# Patient Record
Sex: Male | Born: 1955
Health system: Southern US, Community
[De-identification: ages and names within clinical notes are randomized; demographics above are authoritative.]

## PROBLEM LIST (undated history)

## (undated) DIAGNOSIS — I10 Essential (primary) hypertension: Secondary | ICD-10-CM

## (undated) DIAGNOSIS — E119 Type 2 diabetes mellitus without complications: Secondary | ICD-10-CM

## (undated) DIAGNOSIS — C9210 Chronic myeloid leukemia, BCR/ABL-positive, not having achieved remission: Secondary | ICD-10-CM

## (undated) DIAGNOSIS — E785 Hyperlipidemia, unspecified: Secondary | ICD-10-CM

## (undated) DIAGNOSIS — M549 Dorsalgia, unspecified: Secondary | ICD-10-CM

## (undated) DIAGNOSIS — C9590 Leukemia, unspecified not having achieved remission: Secondary | ICD-10-CM

## (undated) DIAGNOSIS — Z9289 Personal history of other medical treatment: Secondary | ICD-10-CM

## (undated) DIAGNOSIS — G8929 Other chronic pain: Secondary | ICD-10-CM

## (undated) HISTORY — DX: Personal history of other medical treatment: Z92.89

## (undated) HISTORY — DX: Chronic myeloid leukemia, BCR/ABL-positive, not having achieved remission: C92.10

## (undated) HISTORY — DX: Hyperlipidemia, unspecified: E78.5

## (undated) HISTORY — PX: TOTAL HIP ARTHROPLASTY: SHX124

---

## 1994-01-24 HISTORY — PX: KNEE SURGERY: SHX244

## 2005-02-05 ENCOUNTER — Ambulatory Visit (HOSPITAL_COMMUNITY): Admission: RE | Admit: 2005-02-05 | Discharge: 2005-02-05 | Payer: Self-pay | Admitting: General Surgery

## 2005-10-29 ENCOUNTER — Encounter: Admission: RE | Admit: 2005-10-29 | Discharge: 2005-10-30 | Payer: Self-pay | Admitting: Oncology

## 2005-10-29 ENCOUNTER — Encounter (HOSPITAL_COMMUNITY): Admission: RE | Admit: 2005-10-29 | Discharge: 2005-10-30 | Payer: Self-pay | Admitting: Oncology

## 2005-10-29 ENCOUNTER — Ambulatory Visit (HOSPITAL_COMMUNITY): Payer: Self-pay | Admitting: Oncology

## 2005-11-29 ENCOUNTER — Encounter: Admission: RE | Admit: 2005-11-29 | Discharge: 2005-11-29 | Payer: Self-pay | Admitting: Oncology

## 2005-11-29 ENCOUNTER — Encounter (HOSPITAL_COMMUNITY): Admission: RE | Admit: 2005-11-29 | Discharge: 2005-12-29 | Payer: Self-pay | Admitting: Oncology

## 2005-12-27 ENCOUNTER — Ambulatory Visit (HOSPITAL_COMMUNITY): Payer: Self-pay | Admitting: Oncology

## 2006-01-24 ENCOUNTER — Encounter: Admission: RE | Admit: 2006-01-24 | Discharge: 2006-01-24 | Payer: Self-pay | Admitting: Oncology

## 2006-01-24 ENCOUNTER — Encounter (HOSPITAL_COMMUNITY): Admission: RE | Admit: 2006-01-24 | Discharge: 2006-02-23 | Payer: Self-pay | Admitting: Oncology

## 2006-12-24 ENCOUNTER — Ambulatory Visit (HOSPITAL_COMMUNITY): Admission: RE | Admit: 2006-12-24 | Discharge: 2006-12-24 | Payer: Self-pay | Admitting: Family Medicine

## 2006-12-27 HISTORY — PX: BACK SURGERY: SHX140

## 2007-02-01 ENCOUNTER — Emergency Department (HOSPITAL_COMMUNITY): Admission: EM | Admit: 2007-02-01 | Discharge: 2007-02-01 | Payer: Self-pay | Admitting: Emergency Medicine

## 2007-03-06 ENCOUNTER — Ambulatory Visit (HOSPITAL_COMMUNITY): Payer: Self-pay | Admitting: Oncology

## 2007-03-06 ENCOUNTER — Encounter (HOSPITAL_COMMUNITY): Admission: RE | Admit: 2007-03-06 | Discharge: 2007-04-05 | Payer: Self-pay | Admitting: Oncology

## 2007-12-19 ENCOUNTER — Encounter: Admission: RE | Admit: 2007-12-19 | Discharge: 2007-12-19 | Payer: Self-pay | Admitting: Specialist

## 2008-01-22 ENCOUNTER — Ambulatory Visit (HOSPITAL_COMMUNITY): Admission: RE | Admit: 2008-01-22 | Discharge: 2008-01-23 | Payer: Self-pay | Admitting: Specialist

## 2008-06-25 ENCOUNTER — Encounter: Payer: Self-pay | Admitting: Internal Medicine

## 2008-06-25 ENCOUNTER — Ambulatory Visit (HOSPITAL_COMMUNITY): Admission: RE | Admit: 2008-06-25 | Discharge: 2008-06-25 | Payer: Self-pay | Admitting: Internal Medicine

## 2008-06-25 ENCOUNTER — Ambulatory Visit: Payer: Self-pay | Admitting: Internal Medicine

## 2008-11-26 HISTORY — PX: CARDIAC CATHETERIZATION: SHX172

## 2009-03-30 ENCOUNTER — Ambulatory Visit (HOSPITAL_COMMUNITY): Admission: RE | Admit: 2009-03-30 | Discharge: 2009-03-30 | Payer: Self-pay | Admitting: Family Medicine

## 2009-04-18 ENCOUNTER — Encounter: Admission: RE | Admit: 2009-04-18 | Discharge: 2009-04-18 | Payer: Self-pay | Admitting: Family Medicine

## 2009-04-30 ENCOUNTER — Encounter: Payer: Self-pay | Admitting: Emergency Medicine

## 2009-04-30 ENCOUNTER — Observation Stay (HOSPITAL_COMMUNITY): Admission: EM | Admit: 2009-04-30 | Discharge: 2009-05-02 | Payer: Self-pay | Admitting: Cardiovascular Disease

## 2011-03-05 LAB — HEPARIN LEVEL (UNFRACTIONATED)
Heparin Unfractionated: 0.65 IU/mL (ref 0.30–0.70)
Heparin Unfractionated: 0.87 IU/mL — ABNORMAL HIGH (ref 0.30–0.70)

## 2011-03-05 LAB — COMPREHENSIVE METABOLIC PANEL
AST: 19 U/L (ref 0–37)
Albumin: 3.3 g/dL — ABNORMAL LOW (ref 3.5–5.2)
Alkaline Phosphatase: 34 U/L — ABNORMAL LOW (ref 39–117)
BUN: 12 mg/dL (ref 6–23)
Creatinine, Ser: 0.87 mg/dL (ref 0.4–1.5)
GFR calc Af Amer: >60 mL/min (ref >60)
Potassium: 3.6 mEq/L (ref 3.5–5.1)
Total Protein: 5.5 g/dL — ABNORMAL LOW (ref 6.0–8.3)

## 2011-03-05 LAB — DIFFERENTIAL
Eosinophils Absolute: 0 10*3/uL (ref 0.0–0.7)
Eosinophils Relative: 0 % (ref 0–5)
Lymphocytes Relative: 19 % (ref 12–46)
Lymphs Abs: 1.9 10*3/uL (ref 0.7–4.0)
Monocytes Absolute: 0.7 10*3/uL (ref 0.1–1.0)
Monocytes Relative: 7 % (ref 3–12)

## 2011-03-05 LAB — MAGNESIUM: Magnesium: 1.6 mg/dL (ref 1.5–2.5)

## 2011-03-05 LAB — CBC
HCT: 43.8 % (ref 39.0–52.0)
HCT: 36.8 % — ABNORMAL LOW (ref 39.0–52.0)
Hemoglobin: 14.6 g/dL (ref 13.0–17.0)
MCV: 92 fL (ref 78.0–100.0)
Platelets: 216 10*3/uL (ref 150–400)
Platelets: 236 10*3/uL (ref 150–400)
RBC: 4.76 MIL/uL (ref 4.22–5.81)
RDW: 14 % (ref 11.5–15.5)
RDW: 14.3 % (ref 11.5–15.5)
WBC: 9.9 10*3/uL (ref 4.0–10.5)

## 2011-03-05 LAB — TROPONIN I: Troponin I: 0.02 ng/mL (ref 0.00–0.06)

## 2011-03-05 LAB — GLUCOSE, CAPILLARY
Glucose-Capillary: 135 mg/dL — ABNORMAL HIGH (ref 70–99)
Glucose-Capillary: 145 mg/dL — ABNORMAL HIGH (ref 70–99)
Glucose-Capillary: 202 mg/dL — ABNORMAL HIGH (ref 70–99)

## 2011-03-05 LAB — TSH: TSH: 1.319 u[IU]/mL (ref 0.350–4.500)

## 2011-03-05 LAB — BASIC METABOLIC PANEL
Chloride: 103 mEq/L (ref 96–112)
GFR calc non Af Amer: >60 mL/min (ref >60)
Potassium: 4 mEq/L (ref 3.5–5.1)
Sodium: 137 mEq/L (ref 135–145)

## 2011-03-05 LAB — CARDIAC PANEL(CRET KIN+CKTOT+MB+TROPI)
Relative Index: 1.8 (ref 0.0–2.5)
Total CK: 116 U/L (ref 7–232)
Troponin I: 0.02 ng/mL (ref 0.00–0.06)

## 2011-03-05 LAB — T4, FREE: Free T4: 1.28 ng/dL (ref 0.80–1.80)

## 2011-03-05 LAB — PROTIME-INR
INR: 1 (ref 0.00–1.49)
Prothrombin Time: 13.9 seconds (ref 11.6–15.2)

## 2011-03-05 LAB — POCT CARDIAC MARKERS
CKMB, poc: 1.4 ng/mL (ref 1.0–8.0)
Myoglobin, poc: 50.7 ng/mL (ref 12–200)
Troponin i, poc: <0.05 ng/mL (ref 0.00–0.09)

## 2011-03-05 LAB — T4: T4, Total: 8.9 ug/dL (ref 5.0–12.5)

## 2011-03-05 LAB — APTT: aPTT: 28 seconds (ref 24–37)

## 2011-03-05 LAB — D-DIMER, QUANTITATIVE: D-Dimer, Quant: 0.25 ug/mL-FEU (ref 0.00–0.48)

## 2011-03-05 LAB — HEMOGLOBIN A1C: Hgb A1c MFr Bld: 8 % — ABNORMAL HIGH (ref 4.6–6.1)

## 2011-03-05 LAB — CK TOTAL AND CKMB (NOT AT ARMC): Total CK: 108 U/L (ref 7–232)

## 2011-04-10 NOTE — Discharge Summary (Signed)
NAME:  Dylan Spears, Dylan Spears NO.:  [id number]   MEDICAL RECORD NO.:  [id number]          PATIENT TYPE:  OBV   LOCATION:  2609                         FACILITY:  [facility]   PHYSICIAN:  Nicki Guadalajara, M.D.     DATE OF BIRTH:  27-Jan-1956   DATE OF ADMISSION:  04/30/2009  DATE OF DISCHARGE:  05/02/2009                               DISCHARGE SUMMARY   DISCHARGE DIAGNOSES:  1. Chest pain, negative myocardial infarction, negative D-dimer, and      nonobstructive coronary artery disease.  2. Non-insulin dependent diabetes mellitus, poorly controlled.  3. Hypertension, improved.  4. Dyslipidemia.  5. Chronic back pain.  6. Chronic myelogenous leukemia.   DISCHARGE CONDITION:  Improved.   PROCEDURES:  Combined left heart cath May 02, 2009 by Dr. Nicki Guadalajara  with 10% LAD stenosis with mild-to-moderate bridging as well as left  ventricular hypertrophy on LV gram of his left ventricle and EF was  normal.   DISCHARGE MEDICATIONS:  1. Glucovance 2.5/500 twice a day.  Do not take until May 05, 2009,      in the morning as previously instructed.  In the meantime, his      glyburide 2.5 mg twice a day until May 05, 2009.  2. Metoprolol.  We changed to metoprolol tartrate 50 mg 1 pill 3 times      a day.  3. Lyrica 50 mg daily.  4. Gleevec 400 mg daily.  5. Lipitor 20 mg daily.  6. Altace 10 mg daily.  7. Aspirin 325 mg daily.  8. Stop amlodipine.   DISCHARGE INSTRUCTIONS:  1. May return to work May 06, 2009.  2. Low-sodium, heart-healthy diabetic diet.  3. Increase activity slowly.  4. May shower on May 03, 2009, no lifting for 3 days.  No driving for      3 days.  5. Wash cath site with soap and water.  Call if any bleeding,      swelling, or drainage.  6. Follow up with Dr. Tresa Endo in [facility].  We will call with      date and time.  7. Follow up with Dr. Nobie Putnam for diabetes management.  8. You are scheduled for 2-D echo, May 18, 2009, at 7 a.m. in our   [place] office.   HISTORY OF PRESENT ILLNESS:  A 55 year old African American male was  seen in the emergency room at [facility] on April 30, 2009,  after presented with chest pain.  He felt lightheaded, presyncopal, and  had tightness in his left chest and numbness, tingling in his left hand.  He was given nitroglycerin with relief of discomfort.  Over the last few  weeks, he has had increasing fatigue and shortness of breath, dyspnea on  exertion.   PAST MEDICAL HISTORY:  Diabetes x11 years, non-insulin dependent,  hypertension, hyperlipidemia, chronic back pain with history of back  surgery, CML x4  years, currently in remission.  He has had appendectomy  and left knee arthroscopy.   ALLERGIES:  No known allergies.   FAMILY HISTORY:  See H and P.   SOCIAL  HISTORY:  See H and P.   The patient is currently not working since 2008, secondary to back  injury.   PHYSICAL EXAMINATION:  VITAL SIGNS:  At discharge, blood pressure  122/82, pulse 70.  CHEST:  No further chest pain.  HEART:  Regular rate and rhythm.  LUNGS:  Clear.   HOSPITAL COURSE:  The patient was admitted by Dr. Tresa Endo to step-down  unit secondary to chest pain and to rule out MI.  He was placed on IV  nitro, IV heparin.   Cardiac enzymes were negative.  Underwent cardiac catheterization May 02, 2009, with results as stated.   After cardiac cath, once he ambulates and has no complications, he will  be discharged home.   LABORATORY DATA:  Hemoglobin 14.6 and hematocrit 43.8, WBC 9.9,  platelets 276, neutrophil 73.   Chemistry:  Sodium 139, potassium 3.6, chloride 109, CO2 of 24, BUN 12,  creatinine 0.87, glucose 129.   Coags:  Protime 13.9, INR of 1, heparin 0.48, AST 19, ALT 19, alkaline  phos 34, total bili 0.5, albumin 3.3.  Cardiac markers:  CK range 108-  129, MB 1.7-2.2, and troponin I 0.02; all negative for MI.   Calcium 8.7, magnesium 1.6.  Glycohemoglobin was 82.   TSH 1.319.   T4  was 8.9.   Free T4 was 1.28.   Free T3 was 2.8.  D-dimer was negative at 0.25.   Chest x-ray:  No acute abnormality.  EKGs, sinus rhythm, LVH, and no  acute changes.   HOSPITAL COURSE:  The patient was admitted, ruled out for MI, underwent  cardiac cath with nonobstructive coronary disease.  He was hypertensive  on admission with blood pressure of 143/97.  Medications have been  adjusted.  We did stop his Norvasc and increased his beta-blocker.  He  will follow up as instructed.      Darcella Gasman. Annie Paras, N.P.    ______________________________  Nicki Guadalajara, M.D.    LRI/MEDQ  D:  05/02/2009  T:  05/03/2009  Job:  [id number]   cc:   Patrica Duel, M.D.  Nicki Guadalajara, M.D.  Dr. Greggory Stallion

## 2011-04-10 NOTE — Op Note (Signed)
NAME:  Dylan Spears, Dylan Spears              ACCOUNT NO.:  [id number]   MEDICAL RECORD NO.:  [id number]          PATIENT TYPE:  AMB   LOCATION:  DAY                           FACILITY:  [facility]   PHYSICIAN:  R. Roetta Sessions, M.D. DATE OF BIRTH:  12/21/1955   DATE OF PROCEDURE:  06/25/2008  DATE OF DISCHARGE:                               OPERATIVE REPORT   PROCEDURE:  Colonoscopy and snare polypectomy, polyp rigid.   INDICATIONS FOR PROCEDURE:  A 55 year old African American male with no  lower GI tract symptoms referred by Dr. Sherwood Gambler for colorectal cancer  screening.  He has never had his lower GI tract imaged.  Family history  is significant and that his mother succumbed colorectal cancer and was  diagnosed with a disease at age 66.  Colonoscopy is now being done.  Risks, benefits, alternatives, and limitations have been reviewed.  Questions answered.  He is agreeable.  Please see the documentation in  the medical record.   PROCEDURE NOTE:  O2 saturation, blood pressure, pulse, and respirations  monitored throughout the entire procedure.   CONSCIOUS SEDATION:  Versed 4 mg IV and Demerol 100 mg IV in divided  doses.   INSTRUMENT:  Pentax video chip system.   FINDINGS:  Digital rectal exam revealed no abnormalities.  Endoscopic  findings:  The prep was good.  Colon:  Colonic mucosa was surveyed from  the rectosigmoid junction through the left transverse to right colon,  appendiceal orifice, ileocecal valve, and cecum.  These structures were  well seen and photographed for the record.  From this level, the scope  was slowly and cautiously withdrawn.  All previously mentioned mucosal  surfaces were again seen.  The patient had scattered left-sided  diverticula and a pedunculated 1-cm polyp in the mid descending colon.  This was resected with a hot snare and recovered through the scope  without difficulty.  The remainder of the colonic mucosa appeared  normal.  The scope was pulled down the  rectum with thorough examination  of the rectal mucosa including retroflexed view of the anal verge  demonstrated no abnormalities.  The patient tolerated the procedure well  and was reactive to Endoscopy.   IMPRESSION:  Normal rectum, left-sided diverticula, pedunculated mid  descending colon polyp, status post hot snare removal.  The remainder of  the colonic mucosa appeared normal.   RECOMMENDATIONS:  1. Diverticulosis and polyp literature provided to [name].  2. Follow up on Path.  3. Further recommendations to follow.       Jonathon Bellows, M.D.  Electronically Signed     RMR/MEDQ  D:  06/25/2008  T:  06/26/2008  Job:  [id number]   cc:   Madelin Rear. Sherwood Gambler, MD  Fax: [phone]

## 2011-04-10 NOTE — Op Note (Signed)
NAME:  Dylan Spears, Dylan Spears NO.:  [id number]   MEDICAL RECORD NO.:  [id number]          PATIENT TYPE:  AMB   LOCATION:  DAY                          [facility]ACILITY:  [facility]   PHYSICIAN:  Jene Every, M.D.    DATE OF BIRTH:  March 03, 1956   DATE OF PROCEDURE:  01/22/2008  DATE OF DISCHARGE:                               OPERATIVE REPORT   PREOPERATIVE DIAGNOSIS:  Spinal stenosis L4-L5.   POSTOPERATIVE DIAGNOSIS:  Spinal stenosis L4-L5.   PROCEDURE PERFORMED:  1. Central decompression L4-L5.  2. Bilateral L5 and L4 foraminotomies.  3. Use of operating microscope.   ANESTHESIA:  General.   SURGEON:  Jene Every, M.D.   ASSISTANT:  Roma Schanz, P.A.-C.   BRIEF HISTORY AND INDICATIONS:  This is a 55 year old predominantly left  lower extremity radicular pain in the L5 nerve root distribution  secondary to a small disc protrusion and lateral recess stenosis  confirmed on myelogram had persistent neural tension signs, EHL  weakness, and was indicated for decompression.  The risks and benefits  were discussed including bleeding, infection, damage to neurovascular  structures, CSF leakage, epidural fibrosis, adjacent segment disease,  need for fusion in the future, anesthetic complications, etc.   SURGICAL TECHNIQUE:  With the patient in the supine position, after the  induction of adequate general anesthesia and 2 grams Kefzol, the patient  was placed prone on the [place] frame.  All bony prominences were well  padded.  The lumbar region was prepped and draped in the usual sterile  fashion.  Two 18 gauge spinal needles were utilized to localize the L4-  L5 interspace confirmed with x-ray.  An incision was made from the  spinous process of L4 to L5, subcutaneous tissue was dissected,  electrocautery was utilized to achieve hemostasis.  The dorsal lumbar  fascia was identified and divided in line with the skin incision.  The  paraspinous muscle was elevated from the  lamina of L4 and L5.  A  McCullough retractor was placed.  The operating microscope was draped  and brought on the surgical field.  We copiously irrigated the operative  field.   A Leksell rongeur was utilized to remove a portion of the spinous  process of L4 and L5.  We then performed a hemilaminotomy of the caudad  edge of L4 with a 2 and 3 mm Kerrison detaching the ligamentum flavum.  In a similar fashion, we used a straight curette and a 2 mm Kerrison to  detach the ligamentum flavum from the cephalad edge of L5 and performed  bilateral hemilaminotomies here, as well.  Abundant epidural fat was  noted.  Protecting neural elements, we performed a foraminotomy of L5  bilaterally, was stenotic on the left predominantly, also on the right.  With the neural elements well protected, we decompressed the lateral  recesses bilaterally to the medial border of the pedicle, significant  stenosis was noted here.  There was a very small disc protrusion that I  felt was not significant to require a discectomy.  I performed  foraminotomies of L4 bilaterally as there was stenosis noted here,  as  well.  On the contralateral side, there was no disc herniation.  A  hockey stick probe was placed at the foramina of L4 and L5 and found to  be widely patent.  The thecal sac was reconstituted. There was no  evidence of CSF leakage or active bleeding.  I passed a pedicle cephalad  to above the pedicle of L4 and caudad to below the pedicle of L5 and it  was well decompressed. The point of pathology noted on the myelogram was  right at the disc space and we were above and below that.   Again, bipolar electrocautery was utilized to achieve hemostasis.  The  disc space was copiously irrigated with antibiotic irrigation throughout  the case.  Bone wax was placed on the cancellous surfaces.  Thrombin  soaked Gelfoam was placed in the laminotomy defect.  The McCullough  retractor was removed.  Irrigation was  utilized.  No evidence of active  bleeding was noted.  The dorsal lumbar fascia was reapproximated with #1  Vicryl interrupted figure-of-eight sutures, the subcutaneous tissue  reapproximated with 2-0 Vicryl simple sutures, and the skin was  reapproximated with staples, irrigating each layer throughout.  The  wound was dressed sterilely.  He was placed supine on the hospital bed,  extubated without difficulty, and transported to the recovery room in  satisfactory condition.  The patient tolerated the procedure well, there  were no complications.      Jene Every, M.D.  Electronically Signed     JB/MEDQ  D:  01/22/2008  T:  01/22/2008  Job:  [id number]

## 2011-04-10 NOTE — Cardiovascular Report (Signed)
NAME:  Dylan Spears, Dylan Spears NO.:  [id number]   MEDICAL RECORD NO.:  [id number]          PATIENT TYPE:  OBV   LOCATION:  2609                         FACILITY:  [facility]   PHYSICIAN:  Nicki Guadalajara, M.D.     DATE OF BIRTH:  [birth year]   DATE OF PROCEDURE:  05/02/2009  DATE OF DISCHARGE:  05/02/2009                            CARDIAC CATHETERIZATION   PROCEDURES:  Cardiac catheterization:  Cine coronary angiography;  biplane cineventriculography; distal aortography.   PATIENT PROFILE:  Mr. Dylan Spears is a 55 year old African American  male who has a 11-year history of diabetes mellitus, history of  hypertension, hyperlipidemia, as well as chronic back pain.  He has  recently noticed increasing fatigue and shortness of breath with  activity.  He presented to [facility] Emergency Room on April 30, 2009,  after experiencing some substernal chest tightness which responded to  nitroglycerin.  On presentation, he did have sinus tachycardia.  His  cardiac enzymes have been negative.  He was started on medical therapy.  In light of his cardiac risk factors and worrisome symptoms, definitive  cardiac catheterization was recommended.   PROCEDURE:  After premedication with Versed 2 mg plus fentanyl 15 mcg,  the patient was prepped and draped in usual fashion.  His right femoral  artery was punctured anteriorly, and a 5-French sheath was inserted  without difficulty.  Diagnostic cardiac catheterization was done  utilizing 5-French Judkins 4 left and right coronary catheters.  A 5-  French pigtail catheter was used for biplane cineventriculography as  well as distal aortography.  Hemostasis was obtained by direct manual  pressure.  The patient tolerated the procedure well.   HEMODYNAMIC DATA:  Central aortic pressure was 130/74.  Left ventricular  pressure was 130/6.  Post A-wave 11.   ANGIOGRAPHIC DATA:  Left main coronary was a large vessel which  trifurcated into a large LAD  system, a small intermediate system, and a  large left circumflex coronary artery.   The left main was angiographically normal.   The LAD was a large vessel that gave rise to 2 proximal diagonal vessels  and a proximal septal perforating artery.  After the second diagonal  vessel, there was mild 10% narrowing.  There was suggestion of very  minimal mid LAD muscle bridging which was not significant.  The LAD  otherwise was normal and wrapped around the apex.  There was no  significant obstructive disease.   The ramus intermediate vessel was a small normal vessel.   The circumflex vessel was large vessel which gave rise to 3 marginal  vessels and ended in a posterolateral coronary artery.  The circumflex  in its branches were normal.   The right coronary artery was a large vessel which was angiographically  normal and gave rise to a large PDA system.  There was a small distal  RCA beyond the since the PLA vessel essentially arose from the  circumflex system.   Biplane cineventriculography revealed normal LV contractility.  There  was evidence for left ventricular hypertrophy with increased wall  thickness appearing concentrically.   Distal aortography did  not demonstrate any evidence for renal artery  stenosis.  There was no significant aortoiliac disease.   IMPRESSION:  1. Normal left ventricular function with evidence for concentric left      ventricular hypertrophy.  2. No significant coronary obstructive disease with mild 10% narrowing      in the left anterior descending with suggestion of very mild mid      left anterior descending muscle bridging, not felt to be      significant.  3. Normal circumflex and right coronary arteries.   RECOMMENDATIONS:  Medical therapy.  With the patient's risk factors, he  will require aggressive lipid intervention and blood pressure control.           ______________________________  Nicki Guadalajara, M.D.     TK/MEDQ  D:  05/02/2009   T:  05/03/2009  Job:  [id number]   cc:   Patrica Duel, M.D.

## 2011-04-13 NOTE — H&P (Signed)
NAME:  TEREZ, FREIMARK NO.:  [id number]   MEDICAL RECORD NO.:  [id number]           PATIENT TYPE:   LOCATION:                                 FACILITY:   PHYSICIAN:  Dalia Heading, M.D.  DATE OF BIRTH:  1956/10/27   DATE OF ADMISSION:  DATE OF DISCHARGE:  LH                                HISTORY & PHYSICAL   CHIEF COMPLAINT:  Mass, back.   HISTORY OF PRESENT ILLNESS:  The patient is a 55 year old black male who is  referred for evaluation and treatment of a mass on his back.  It has been  present for some time, but has increased in size.  No drainage has been  noted.   PAST MEDICAL HISTORY:  1.  Non-insulin-dependent diabetes mellitus.  2.  Hypertension.   PAST SURGICAL HISTORY:  Unremarkable.   CURRENT MEDICATIONS:  1.  Glucovance 2.5-500 mg p.o. t.i.d.  2.  Lipitor 20 mg p.o. daily.  3.  Altace 2.5 mg p.o. daily.   ALLERGIES:  No known drug allergies.   REVIEW OF SYSTEMS:  Noncontributory.   PHYSICAL EXAMINATION:  The patient is a well-developed, well-nourished black  male in no acute distress.  He is afebrile and vital signs are stable.  HEENT:  Reveals no scleral icterus.  LUNGS:  Clear to auscultation with equal breath sounds bilaterally.  HEART:  Reveals a regular rate and rhythm without S3, S4 or murmurs.  BACK:  Reveals a 4 cm oval mass noted along the upper midportion of the  back.  A punctum is present.  No drainage is noted.   IMPRESSION:  An enlarging mass, back.   PLAN:  The patient is scheduled for excision of the mass, back, on February 05, 2005.  The risks and benefits of the procedure including bleeding, infection  and recurrence of the mass were fully explained to the patient, gave  informed consent.      MAJ/MEDQ  D:  02/01/2005  T:  02/01/2005  Job:  [id number]   cc:   Patrica Duel, M.D.  [address], [address]  [place]  [place] [address]  Fax: [phone]

## 2011-04-13 NOTE — Op Note (Signed)
NAME:  Dylan Spears, Dylan Spears NO.:  [id number]   MEDICAL RECORD NO.:  [id number]          PATIENT TYPE:  AMB   LOCATION:  DAY                           FACILITY:  [facility]   PHYSICIAN:  Dalia Heading, M.D.  DATE OF BIRTH:  March 10, 1956   DATE OF PROCEDURE:  02/05/2005  DATE OF DISCHARGE:                                 OPERATIVE REPORT   PREOPERATIVE DIAGNOSIS:  Mass, back.   POSTOPERATIVE DIAGNOSIS:  Infected sebaceous cyst, back, 4 cm.   PROCEDURE:  Excision of infected sebaceous cyst, back.   SURGEON:  Dalia Heading, M.D.   ANESTHESIA:  General.   INDICATIONS:  The patient is a 55 year old black male who was referred for  an enlarging mass on his back.  The risks and benefits of the procedure  including bleeding, infection, and recurrence of the mass, were fully  explained to the patient, who gave informed consent.   PROCEDURE NOTE:  The patient was placed on the right lateral decubitus  position after general anesthesia was administered.  The back was prepped and draped using the usual sterile technique with  Betadine.  Surgical site confirmation was performed.   A transverse incision was made over the mass.  Purulent fluid and sebum were  removed.  The wound was debrided with curettes.  Any bleeding was controlled  using Bovie electrocautery.  The wound was irrigated normal saline.  The  subcutaneous layer was reapproximated using a 3-0 Vicryl interrupted suture.  The skin was closed using a 4-0 nylon interrupted suture.  Betadine ointment  and a dry sterile dressing were applied.   All tape and needle counts were correct and the procedure.  The patient was  awakened and transferred to PACU in stable condition.   COMPLICATIONS:  None.   SPECIMEN:  Sebaceous cyst, back.   BLOOD LOSS:  Minimal.      MAJ/MEDQ  D:  02/05/2005  T:  02/05/2005  Job:  [id number]   cc:   Patrica Duel, M.D.  [address]  [place]  [place] [address]  Fax:  [phone]

## 2011-08-17 LAB — URINALYSIS, ROUTINE W REFLEX MICROSCOPIC
Ketones, ur: NEGATIVE
Nitrite: NEGATIVE
Specific Gravity, Urine: 1.022
Urobilinogen, UA: 0.2
pH: 6

## 2011-08-17 LAB — COMPREHENSIVE METABOLIC PANEL
ALT: 20
AST: 18
Albumin: 3.9
Alkaline Phosphatase: 44
GFR calc Af Amer: >60
Glucose, Bld: 89
Potassium: 3.9
Sodium: 136
Total Protein: 6.4

## 2011-08-17 LAB — DIFFERENTIAL
Basophils Relative: 1
Eosinophils Absolute: 0.1
Eosinophils Relative: 1
Monocytes Absolute: 0.5
Monocytes Relative: 7
Neutrophils Relative %: 63

## 2011-08-17 LAB — CBC
Hemoglobin: 13.9
RBC: 4.49
RDW: 14.9

## 2011-11-08 ENCOUNTER — Other Ambulatory Visit: Payer: Self-pay | Admitting: Orthopedic Surgery

## 2011-11-08 ENCOUNTER — Ambulatory Visit
Admission: RE | Admit: 2011-11-08 | Discharge: 2011-11-08 | Disposition: A | Discharge status: Home / Self Care | Payer: BC Managed Care – PPO | Source: Ambulatory Visit | Attending: Orthopedic Surgery | Admitting: Orthopedic Surgery

## 2011-11-08 DIAGNOSIS — R52 Pain, unspecified: Secondary | ICD-10-CM

## 2011-12-11 DIAGNOSIS — B351 Tinea unguium: Secondary | ICD-10-CM | POA: Diagnosis not present

## 2011-12-11 DIAGNOSIS — Z6832 Body mass index (BMI) 32.0-32.9, adult: Secondary | ICD-10-CM | POA: Diagnosis not present

## 2011-12-11 DIAGNOSIS — G8929 Other chronic pain: Secondary | ICD-10-CM | POA: Diagnosis not present

## 2011-12-19 DIAGNOSIS — C9211 Chronic myeloid leukemia, BCR/ABL-positive, in remission: Secondary | ICD-10-CM | POA: Diagnosis not present

## 2012-03-03 DIAGNOSIS — E785 Hyperlipidemia, unspecified: Secondary | ICD-10-CM | POA: Diagnosis not present

## 2012-03-03 DIAGNOSIS — E119 Type 2 diabetes mellitus without complications: Secondary | ICD-10-CM | POA: Diagnosis not present

## 2012-03-03 DIAGNOSIS — I10 Essential (primary) hypertension: Secondary | ICD-10-CM | POA: Diagnosis not present

## 2012-03-03 DIAGNOSIS — R5383 Other fatigue: Secondary | ICD-10-CM | POA: Diagnosis not present

## 2012-03-04 DIAGNOSIS — E291 Testicular hypofunction: Secondary | ICD-10-CM | POA: Diagnosis not present

## 2012-03-24 DIAGNOSIS — M2390 Unspecified internal derangement of unspecified knee: Secondary | ICD-10-CM | POA: Diagnosis not present

## 2012-04-07 DIAGNOSIS — E291 Testicular hypofunction: Secondary | ICD-10-CM | POA: Diagnosis not present

## 2012-04-15 DIAGNOSIS — E119 Type 2 diabetes mellitus without complications: Secondary | ICD-10-CM | POA: Diagnosis not present

## 2012-05-05 DIAGNOSIS — M2390 Unspecified internal derangement of unspecified knee: Secondary | ICD-10-CM | POA: Diagnosis not present

## 2012-05-08 DIAGNOSIS — E291 Testicular hypofunction: Secondary | ICD-10-CM | POA: Diagnosis not present

## 2012-05-26 DIAGNOSIS — Z9289 Personal history of other medical treatment: Secondary | ICD-10-CM

## 2012-05-26 HISTORY — DX: Personal history of other medical treatment: Z92.89

## 2012-06-03 DIAGNOSIS — Z6832 Body mass index (BMI) 32.0-32.9, adult: Secondary | ICD-10-CM | POA: Diagnosis not present

## 2012-06-03 DIAGNOSIS — M6530 Trigger finger, unspecified finger: Secondary | ICD-10-CM | POA: Diagnosis not present

## 2012-06-03 DIAGNOSIS — E785 Hyperlipidemia, unspecified: Secondary | ICD-10-CM | POA: Diagnosis not present

## 2012-06-03 DIAGNOSIS — I10 Essential (primary) hypertension: Secondary | ICD-10-CM | POA: Diagnosis not present

## 2012-06-03 DIAGNOSIS — E119 Type 2 diabetes mellitus without complications: Secondary | ICD-10-CM | POA: Diagnosis not present

## 2012-06-18 DIAGNOSIS — C9211 Chronic myeloid leukemia, BCR/ABL-positive, in remission: Secondary | ICD-10-CM | POA: Diagnosis not present

## 2012-06-19 ENCOUNTER — Ambulatory Visit (HOSPITAL_COMMUNITY)
Admission: RE | Admit: 2012-06-19 | Discharge: 2012-06-19 | Disposition: A | Discharge status: Home / Self Care | Payer: Managed Care, Other (non HMO) | Source: Ambulatory Visit | Attending: Cardiovascular Disease | Admitting: Cardiovascular Disease

## 2012-06-19 ENCOUNTER — Other Ambulatory Visit (HOSPITAL_COMMUNITY): Payer: Self-pay | Admitting: Cardiovascular Disease

## 2012-06-19 DIAGNOSIS — E782 Mixed hyperlipidemia: Secondary | ICD-10-CM | POA: Diagnosis not present

## 2012-06-19 DIAGNOSIS — R079 Chest pain, unspecified: Secondary | ICD-10-CM | POA: Diagnosis not present

## 2012-06-19 DIAGNOSIS — R0602 Shortness of breath: Secondary | ICD-10-CM | POA: Diagnosis not present

## 2012-06-19 DIAGNOSIS — E119 Type 2 diabetes mellitus without complications: Secondary | ICD-10-CM | POA: Diagnosis not present

## 2012-06-20 DIAGNOSIS — E109 Type 1 diabetes mellitus without complications: Secondary | ICD-10-CM | POA: Diagnosis not present

## 2012-06-20 DIAGNOSIS — R42 Dizziness and giddiness: Secondary | ICD-10-CM | POA: Diagnosis not present

## 2012-06-20 DIAGNOSIS — R0602 Shortness of breath: Secondary | ICD-10-CM | POA: Diagnosis not present

## 2012-06-20 DIAGNOSIS — R079 Chest pain, unspecified: Secondary | ICD-10-CM | POA: Diagnosis not present

## 2012-06-20 DIAGNOSIS — I10 Essential (primary) hypertension: Secondary | ICD-10-CM | POA: Diagnosis not present

## 2012-07-22 ENCOUNTER — Other Ambulatory Visit: Payer: Self-pay | Admitting: Cardiovascular Disease

## 2012-07-22 DIAGNOSIS — E119 Type 2 diabetes mellitus without complications: Secondary | ICD-10-CM | POA: Diagnosis not present

## 2012-07-22 DIAGNOSIS — I119 Hypertensive heart disease without heart failure: Secondary | ICD-10-CM | POA: Diagnosis not present

## 2012-07-22 DIAGNOSIS — R0789 Other chest pain: Secondary | ICD-10-CM | POA: Diagnosis not present

## 2012-07-22 DIAGNOSIS — R9430 Abnormal result of cardiovascular function study, unspecified: Secondary | ICD-10-CM | POA: Diagnosis not present

## 2012-07-24 ENCOUNTER — Encounter (HOSPITAL_COMMUNITY): Payer: Self-pay | Admitting: Pharmacy Technician

## 2012-07-27 HISTORY — PX: CARDIAC CATHETERIZATION: SHX172

## 2012-07-29 ENCOUNTER — Ambulatory Visit (HOSPITAL_COMMUNITY)
Admission: RE | Admit: 2012-07-29 | Discharge: 2012-07-29 | Disposition: A | Discharge status: Home / Self Care | Payer: Managed Care, Other (non HMO) | Source: Ambulatory Visit | Attending: Cardiovascular Disease | Admitting: Cardiovascular Disease

## 2012-07-29 ENCOUNTER — Encounter (HOSPITAL_COMMUNITY)
Admission: RE | Disposition: A | Discharge status: Home / Self Care | Payer: Self-pay | Source: Ambulatory Visit | Attending: Cardiovascular Disease

## 2012-07-29 DIAGNOSIS — R9439 Abnormal result of other cardiovascular function study: Secondary | ICD-10-CM | POA: Insufficient documentation

## 2012-07-29 DIAGNOSIS — E119 Type 2 diabetes mellitus without complications: Secondary | ICD-10-CM | POA: Insufficient documentation

## 2012-07-29 DIAGNOSIS — C9210 Chronic myeloid leukemia, BCR/ABL-positive, not having achieved remission: Secondary | ICD-10-CM | POA: Insufficient documentation

## 2012-07-29 DIAGNOSIS — I10 Essential (primary) hypertension: Secondary | ICD-10-CM | POA: Insufficient documentation

## 2012-07-29 DIAGNOSIS — R0602 Shortness of breath: Secondary | ICD-10-CM | POA: Insufficient documentation

## 2012-07-29 DIAGNOSIS — R079 Chest pain, unspecified: Secondary | ICD-10-CM | POA: Insufficient documentation

## 2012-07-29 HISTORY — PX: LEFT HEART CATHETERIZATION WITH CORONARY ANGIOGRAM: SHX5451

## 2012-07-29 LAB — GLUCOSE, CAPILLARY
Glucose-Capillary: 166 mg/dL — ABNORMAL HIGH (ref 70–99)
Glucose-Capillary: 167 mg/dL — ABNORMAL HIGH (ref 70–99)

## 2012-07-29 SURGERY — LEFT HEART CATHETERIZATION WITH CORONARY ANGIOGRAM
Anesthesia: LOCAL

## 2012-07-29 MED ORDER — HEPARIN (PORCINE) IN NACL 2-0.9 UNIT/ML-% IJ SOLN
INTRAMUSCULAR | Status: AC
  Filled 2012-07-29: qty 2000

## 2012-07-29 MED ORDER — SODIUM CHLORIDE 0.9 % IV SOLN: 250.0000 mL | INTRAVENOUS | Status: DC | PRN

## 2012-07-29 MED ORDER — FENTANYL CITRATE 0.05 MG/ML IJ SOLN
INTRAMUSCULAR | Status: AC
  Filled 2012-07-29: qty 2

## 2012-07-29 MED ORDER — MIDAZOLAM HCL 2 MG/2ML IJ SOLN
INTRAMUSCULAR | Status: AC
  Filled 2012-07-29: qty 2

## 2012-07-29 MED ORDER — ASPIRIN 81 MG PO CHEW: 324.0000 mg | CHEWABLE_TABLET | ORAL | Status: DC

## 2012-07-29 MED ORDER — SODIUM CHLORIDE 0.9 % IJ SOLN: 3.0000 mL | Freq: Two times a day (BID) | INTRAMUSCULAR | Status: DC

## 2012-07-29 MED ORDER — ONDANSETRON HCL 4 MG/2ML IJ SOLN: 4.0000 mg | Freq: Four times a day (QID) | INTRAMUSCULAR | Status: DC | PRN

## 2012-07-29 MED ORDER — SODIUM CHLORIDE 0.9 % IJ SOLN: 3.0000 mL | INTRAMUSCULAR | Status: DC | PRN

## 2012-07-29 MED ORDER — SODIUM CHLORIDE 0.9 % IV SOLN: INTRAVENOUS | Status: DC

## 2012-07-29 MED ORDER — LIDOCAINE HCL (PF) 1 % IJ SOLN
INTRAMUSCULAR | Status: AC
  Filled 2012-07-29: qty 30

## 2012-07-29 MED ORDER — NITROGLYCERIN 0.2 MG/ML ON CALL CATH LAB
INTRAVENOUS | Status: AC
  Filled 2012-07-29: qty 1

## 2012-07-29 MED ORDER — SODIUM CHLORIDE 0.9 % IV SOLN
INTRAVENOUS | Status: DC
  Administered 2012-07-29: 1000 mL via INTRAVENOUS

## 2012-07-29 NOTE — H&P (Signed)
Updated H&P:  See complete dictated office note from 07/22/2012.  No further chest pain. He has tolerated increased medical therapy.  No change in PEx. Labs reviewed. Discussed cath and possible PCI.  Plan cath this am. Airis Barbee A 07/29/2012 9:02 AM

## 2012-07-29 NOTE — CV Procedure (Signed)
Cardiac Catherization  Haynes Dage, 56 y.o., male  Full note dictated; see diagram in chart  DICTATION # [id number], [id number]  Normal coronary arteries. Normal LV function EF>60%  Tolerated well  Lennette Bihari, MD, [facility] 07/29/2012 9:49 AM

## 2012-07-30 NOTE — Cardiovascular Report (Signed)
NAME:  TAIM, WURM NO.:  [id number]  MEDICAL RECORD NO.:  [id number]  LOCATION:  [facility]                         FACILITY:  [facility]  PHYSICIAN:  Nicki Guadalajara, M.D.     DATE OF BIRTH:  [birth year]  DATE OF PROCEDURE:  07/29/2012 DATE OF DISCHARGE:  07/29/2012                           CARDIAC CATHETERIZATION   INDICATIONS:  Mr. Dylan Spears is a 56 year old African American gentleman who has a history of CML, originally diagnosed in 2004; history of hypertension; hyperlipidemia; and diabetes mellitus.  In 2010, he was noted to have mild nonobstructive CAD.  Recently, the patient had experienced recurrent episodes of chest pressure associated with shortness of breath.  A recent exercise Myoview study suggested possible ischemia in the basal inferior wall, which was present on all three tomographic projections.  With this cardiac risk factor profile, definitive cardiac catheterization was recommended.  PROCEDURE:  After premedication with Versed 2 mg plus fentanyl 50 mcg, the patient was prepped and draped in usual fashion.  He received an additional 1 mg of Versed and 25 mcg of fentanyl for additional sedation.  Catheterization was done via the right femoral artery and a 5- French sheath was inserted without difficulty.  Diagnostic catheterization was done utilizing 5-French Judkins 4 left and right coronary catheters.  A 5-French pigtail catheter was used for RAO ventriculography.  Hemostasis was obtained by direct manual pressure. The patient tolerated the procedure well.  HEMODYNAMIC DATA:  Central aortic pressure 135/75.  Left ventricular pressure 135/11.  ANGIOGRAPHIC DATA:  Left main coronary artery was angiographically normal and trifurcated into a large LAD system, a small ramus intermedius vessel and a large codominant left circumflex coronary artery.  The LAD was angiographically normal and gave rise to proximal septal perforator and diagonal  vessel.  The LAD wrapped around the LV apex and was free of significant obstructive disease.  The intermedius vessel was small caliber angiographically normal vessel.  The left circumflex coronary artery was a large caliber normal vessel that gave rise to a marginal vessel and ended in the PDA, PLA like system and was angiographically normal.  The right coronary artery was angiographically normal and gave rise to PDA and inferior LV branch and small posterolateral vessel.  RAO ventriculography revealed normal LV function.  Ejection fraction is at least 60% without focal segmental wall motion abnormalities.  There was no evidence for mitral regurgitation.  IMPRESSION: 1. Normal left ventricular function. 2. Normal coronary arteries.          ______________________________ Nicki Guadalajara, M.D.     TK/MEDQ  D:  07/29/2012  T:  07/30/2012  Job:  [id number]  cc:   Corrie Mckusick, M.D.

## 2012-08-19 DIAGNOSIS — Z23 Encounter for immunization: Secondary | ICD-10-CM | POA: Diagnosis not present

## 2012-09-02 DIAGNOSIS — R079 Chest pain, unspecified: Secondary | ICD-10-CM | POA: Diagnosis not present

## 2012-09-02 DIAGNOSIS — E119 Type 2 diabetes mellitus without complications: Secondary | ICD-10-CM | POA: Diagnosis not present

## 2012-09-03 NOTE — Addendum Note (Signed)
Addended by: Nicki Guadalajara A on: 09/03/2012 04:25 PM   Modules accepted: Orders

## 2012-09-08 DIAGNOSIS — I10 Essential (primary) hypertension: Secondary | ICD-10-CM | POA: Diagnosis not present

## 2012-09-08 DIAGNOSIS — E119 Type 2 diabetes mellitus without complications: Secondary | ICD-10-CM | POA: Diagnosis not present

## 2012-09-08 DIAGNOSIS — E785 Hyperlipidemia, unspecified: Secondary | ICD-10-CM | POA: Diagnosis not present

## 2012-09-08 DIAGNOSIS — E291 Testicular hypofunction: Secondary | ICD-10-CM | POA: Diagnosis not present

## 2012-10-17 DIAGNOSIS — E291 Testicular hypofunction: Secondary | ICD-10-CM | POA: Diagnosis not present

## 2012-11-24 DIAGNOSIS — E291 Testicular hypofunction: Secondary | ICD-10-CM | POA: Diagnosis not present

## 2012-12-01 DIAGNOSIS — M1710 Unilateral primary osteoarthritis, unspecified knee: Secondary | ICD-10-CM | POA: Diagnosis not present

## 2012-12-01 DIAGNOSIS — IMO0002 Reserved for concepts with insufficient information to code with codable children: Secondary | ICD-10-CM | POA: Diagnosis not present

## 2012-12-17 DIAGNOSIS — I10 Essential (primary) hypertension: Secondary | ICD-10-CM | POA: Diagnosis not present

## 2012-12-17 DIAGNOSIS — E119 Type 2 diabetes mellitus without complications: Secondary | ICD-10-CM | POA: Diagnosis not present

## 2012-12-17 DIAGNOSIS — C9211 Chronic myeloid leukemia, BCR/ABL-positive, in remission: Secondary | ICD-10-CM | POA: Diagnosis not present

## 2012-12-17 DIAGNOSIS — M549 Dorsalgia, unspecified: Secondary | ICD-10-CM | POA: Diagnosis not present

## 2013-01-12 DIAGNOSIS — I10 Essential (primary) hypertension: Secondary | ICD-10-CM | POA: Diagnosis not present

## 2013-01-12 DIAGNOSIS — E785 Hyperlipidemia, unspecified: Secondary | ICD-10-CM | POA: Diagnosis not present

## 2013-01-12 DIAGNOSIS — E1142 Type 2 diabetes mellitus with diabetic polyneuropathy: Secondary | ICD-10-CM | POA: Diagnosis not present

## 2013-01-12 DIAGNOSIS — E1149 Type 2 diabetes mellitus with other diabetic neurological complication: Secondary | ICD-10-CM | POA: Diagnosis not present

## 2013-01-12 DIAGNOSIS — Z6832 Body mass index (BMI) 32.0-32.9, adult: Secondary | ICD-10-CM | POA: Diagnosis not present

## 2013-04-08 DIAGNOSIS — I10 Essential (primary) hypertension: Secondary | ICD-10-CM | POA: Diagnosis not present

## 2013-04-08 DIAGNOSIS — Z23 Encounter for immunization: Secondary | ICD-10-CM | POA: Diagnosis not present

## 2013-04-08 DIAGNOSIS — G8929 Other chronic pain: Secondary | ICD-10-CM | POA: Diagnosis not present

## 2013-04-08 DIAGNOSIS — E119 Type 2 diabetes mellitus without complications: Secondary | ICD-10-CM | POA: Diagnosis not present

## 2013-04-08 DIAGNOSIS — Z6832 Body mass index (BMI) 32.0-32.9, adult: Secondary | ICD-10-CM | POA: Diagnosis not present

## 2013-05-11 DIAGNOSIS — Z23 Encounter for immunization: Secondary | ICD-10-CM | POA: Diagnosis not present

## 2013-06-08 ENCOUNTER — Encounter (HOSPITAL_COMMUNITY): Payer: Self-pay | Admitting: Emergency Medicine

## 2013-06-08 ENCOUNTER — Emergency Department (HOSPITAL_COMMUNITY)
Admission: EM | Admit: 2013-06-08 | Discharge: 2013-06-08 | Disposition: A | Discharge status: Home / Self Care | Payer: Managed Care, Other (non HMO) | Attending: Emergency Medicine | Admitting: Emergency Medicine

## 2013-06-08 DIAGNOSIS — G8929 Other chronic pain: Secondary | ICD-10-CM | POA: Insufficient documentation

## 2013-06-08 DIAGNOSIS — R221 Localized swelling, mass and lump, neck: Secondary | ICD-10-CM

## 2013-06-08 DIAGNOSIS — C9211 Chronic myeloid leukemia, BCR/ABL-positive, in remission: Secondary | ICD-10-CM | POA: Insufficient documentation

## 2013-06-08 DIAGNOSIS — E119 Type 2 diabetes mellitus without complications: Secondary | ICD-10-CM | POA: Insufficient documentation

## 2013-06-08 DIAGNOSIS — I10 Essential (primary) hypertension: Secondary | ICD-10-CM | POA: Insufficient documentation

## 2013-06-08 DIAGNOSIS — Z79899 Other long term (current) drug therapy: Secondary | ICD-10-CM | POA: Insufficient documentation

## 2013-06-08 DIAGNOSIS — M549 Dorsalgia, unspecified: Secondary | ICD-10-CM | POA: Insufficient documentation

## 2013-06-08 DIAGNOSIS — R220 Localized swelling, mass and lump, head: Secondary | ICD-10-CM | POA: Insufficient documentation

## 2013-06-08 DIAGNOSIS — Z7982 Long term (current) use of aspirin: Secondary | ICD-10-CM | POA: Insufficient documentation

## 2013-06-08 HISTORY — DX: Other chronic pain: G89.29

## 2013-06-08 HISTORY — DX: Essential (primary) hypertension: I10

## 2013-06-08 HISTORY — DX: Leukemia, unspecified not having achieved remission: C95.90

## 2013-06-08 HISTORY — DX: Type 2 diabetes mellitus without complications: E11.9

## 2013-06-08 HISTORY — DX: Dorsalgia, unspecified: M54.9

## 2013-06-08 MED ORDER — DIPHENHYDRAMINE HCL 25 MG PO TABS: 25.0000 mg | ORAL_TABLET | Freq: Four times a day (QID) | ORAL | Status: DC

## 2013-06-08 MED ORDER — DIPHENHYDRAMINE HCL 25 MG PO CAPS
25.0000 mg | ORAL_CAPSULE | Freq: Once | ORAL | Status: AC
  Administered 2013-06-08: 25 mg via ORAL
  Filled 2013-06-08: qty 1

## 2013-06-08 NOTE — ED Provider Notes (Signed)
History    This chart was scribed for non-physician practitioner Renne Crigler, PA-C, working with Gilda Crease, by Donne Anon, ED Scribe. This patient was seen in room TR05C/TR05C and the patient's care was started at 2057.  CSN: [id number] Arrival date & time 06/08/13  2040  First MD Initiated Contact with Patient 06/08/13 2057     Chief Complaint  Patient presents with  . Sore Throat    The history is provided by the patient. No language interpreter was used.   HPI Comments: Dylan Spears is a 57 y.o. male who presents to the Emergency Department complaining of sudden onset, gradually improving, constant left sided neck swelling that began at 7:00 PM this evening. He denies any new exposures or changes to his daily routine. He had a sore throat 1 week ago which has since resolved. He denies neck pain, difficulty breathing, difficulty swallowing, itching, otalgia or any other pain. He tried rubbing alcohol on the area as well as a warm compress with little relief. He is in CML remission with rechecks every 6 months.   Past Medical History  Diagnosis Date  . Hypertension   . Diabetes mellitus without complication   . Leukemia   . Chronic back pain    Past Surgical History  Procedure Laterality Date  . Back surgery    . Knee surgery     No family history on file. History  Substance Use Topics  . Smoking status: Never Smoker   . Smokeless tobacco: Not on file  . Alcohol Use: Yes    Review of Systems  Constitutional: Negative for fever.  HENT: Positive for facial swelling (neck only). Negative for ear pain, trouble swallowing, neck pain and neck stiffness.   Eyes: Negative for redness.  Respiratory: Negative for shortness of breath, wheezing and stridor.   Cardiovascular: Negative for chest pain.  Gastrointestinal: Negative for nausea and vomiting.  Musculoskeletal: Negative for myalgias.  Skin: Negative for rash.  Neurological: Negative for syncope and  light-headedness.  Psychiatric/Behavioral: Negative for confusion.    Allergies  Tylenol  Home Medications   Current Outpatient Rx  Name  Route  Sig  Dispense  Refill  . amLODipine (NORVASC) 5 MG tablet   Oral   Take 5 mg by mouth daily.         [name] aspirin 325 MG tablet   Oral   Take 325 mg by mouth daily.         [name] atorvastatin (LIPITOR) 20 MG tablet   Oral   Take 20 mg by mouth at bedtime.          . cetirizine (ZYRTEC ALLERGY) 10 MG tablet   Oral   Take 5 mg by mouth daily as needed. For allergies         . gabapentin (NEURONTIN) 300 MG capsule   Oral   Take 300 mg by mouth daily.         [name] glyBURIDE-metformin (GLUCOVANCE) 2.5-500 MG per tablet   Oral   Take 2 tablets by mouth 2 (two) times daily with a meal.         . imatinib (GLEEVEC) 400 MG tablet   Oral   Take 400 mg by mouth daily. Take with meals and large glass of water.Caution:Chemotherapy.         . Multiple Vitamin (MULTIVITAMIN WITH MINERALS) TABS   Oral   Take 1 tablet by mouth daily.         . nebivolol (BYSTOLIC)  5 MG tablet   Oral   Take 5 mg by mouth daily.         . Omega-3 Fatty Acids (FISH OIL) 1000 MG CAPS   Oral   Take 1 capsule by mouth daily.         . ramipril (ALTACE) 10 MG capsule   Oral   Take 10 mg by mouth daily.         . sitaGLIPtin (JANUVIA) 100 MG tablet   Oral   Take 100 mg by mouth daily.          BP 150/82  Pulse 84  Temp(Src) 98 F (36.7 C) (Oral)  Resp 18  SpO2 98%  Physical Exam  Nursing note and vitals reviewed. Constitutional: He appears well-developed and well-nourished. No distress.  HENT:  Head: Normocephalic and atraumatic.  Mouth/Throat: No oropharyngeal exudate.  No difficulty swallowing or breathing. Mild fullness of anterior neck to palpation. No lymphadenopathy. No thyromegaly.    Eyes: Conjunctivae are normal.  Neck: Normal range of motion. Neck supple. No tracheal deviation present. No thyromegaly present.   Cardiovascular: Normal rate.   Pulmonary/Chest: Effort normal. No respiratory distress.  Musculoskeletal: Normal range of motion.  Neurological: He is alert.  Skin: Skin is warm and dry.  Psychiatric: He has a normal mood and affect. His behavior is normal.    ED Course  Procedures (including critical care time) DIAGNOSTIC STUDIES: Oxygen Saturation is 98% on RA, normal by my interpretation.    COORDINATION OF CARE: 9:20 PM Discussed treatment plan which includes Benadryl and monitoring with pt at bedside and pt agreed to plan.   Labs Reviewed - No data to display No results found. 1. Neck swelling    Patient seen and examined. Medications ordered.   Vital signs reviewed and are as follows: Filed Vitals:   06/08/13 2046  BP: 150/82  Pulse: 84  Temp: 98 F (36.7 C)  Resp: 18   Patient remained stable and has subjective improvement during emergency department stay. I discussed this patient presentation with Dr. Blinda Leatherwood do not feel patient needs any imaging or further evaluation at this point. Patient given strict return instructions. Told to return with worsening swelling, difficulty breathing or swallowing, fever, redness, or if he has any other concerns. Patient verbalizes understanding and agrees with the plan.  MDM  Patient with neck swelling of uncertain etiology. Area is not well demarcated like a lymph node. Do not suspect thyromegaly as the area has swollen and improved over a short period of time. Do not suspect vascular etiology. Do not suspect swollen salivary gland. Swelling seems located in the soft tissues however no signs of infection or allergic reaction. Certainly do not suspect deep space infection in neck as patient has full range of motion and no fever. No difficulty swallowing or breathing. Patient was improving upon arrival to the emergency department and continued to improve. No indication for admission. Patient given strict return instructions.   I  personally performed the services described in this documentation, which was scribed in my presence. The recorded information has been reviewed and is accurate.    Renne Crigler, PA-C 06/09/13 [phone]

## 2013-06-08 NOTE — ED Notes (Signed)
PT. SWELLING AT LEFT SIDE OF THROAT  ONSET THIS EVENING , DENIES PAIN , RESPIRATIONS UNLABORED / AIRWAY INTACT . DENIES FEVER OR CHILLS.

## 2013-06-08 NOTE — ED Notes (Signed)
Pt reports swelling in neck.  Pt used rubbing alcohol and notices reduction in swelling from what he had seen previously.  No swelling noted.  Airway clear and open.

## 2013-06-09 ENCOUNTER — Ambulatory Visit (HOSPITAL_COMMUNITY)
Admission: RE | Admit: 2013-06-09 | Discharge: 2013-06-09 | Disposition: A | Discharge status: Home / Self Care | Payer: Managed Care, Other (non HMO) | Source: Ambulatory Visit | Attending: Family Medicine | Admitting: Family Medicine

## 2013-06-09 ENCOUNTER — Other Ambulatory Visit (HOSPITAL_COMMUNITY): Payer: Self-pay | Admitting: Family Medicine

## 2013-06-09 DIAGNOSIS — Z6832 Body mass index (BMI) 32.0-32.9, adult: Secondary | ICD-10-CM | POA: Diagnosis not present

## 2013-06-09 DIAGNOSIS — E041 Nontoxic single thyroid nodule: Secondary | ICD-10-CM | POA: Insufficient documentation

## 2013-06-09 DIAGNOSIS — R221 Localized swelling, mass and lump, neck: Secondary | ICD-10-CM | POA: Diagnosis not present

## 2013-06-09 DIAGNOSIS — R220 Localized swelling, mass and lump, head: Secondary | ICD-10-CM | POA: Diagnosis not present

## 2013-06-09 NOTE — ED Provider Notes (Signed)
Medical screening examination/treatment/procedure(s) were performed by non-physician practitioner and as supervising physician I was immediately available for consultation/collaboration.    Shelbee Apgar J. Mylan Schwarz, MD 06/09/13 1611 

## 2013-07-07 DIAGNOSIS — E669 Obesity, unspecified: Secondary | ICD-10-CM | POA: Diagnosis not present

## 2013-07-07 DIAGNOSIS — Z713 Dietary counseling and surveillance: Secondary | ICD-10-CM | POA: Diagnosis not present

## 2013-07-07 DIAGNOSIS — E119 Type 2 diabetes mellitus without complications: Secondary | ICD-10-CM | POA: Diagnosis not present

## 2013-07-07 DIAGNOSIS — Z6831 Body mass index (BMI) 31.0-31.9, adult: Secondary | ICD-10-CM | POA: Diagnosis not present

## 2013-08-23 ENCOUNTER — Encounter: Payer: Self-pay | Admitting: *Deleted

## 2013-08-28 ENCOUNTER — Ambulatory Visit (INDEPENDENT_AMBULATORY_CARE_PROVIDER_SITE_OTHER): Payer: Managed Care, Other (non HMO) | Admitting: Cardiovascular Disease

## 2013-08-28 ENCOUNTER — Encounter: Payer: Self-pay | Admitting: Cardiovascular Disease

## 2013-08-28 VITALS — BP 130/82 | HR 76 | Ht 73.0 in | Wt 240.0 lb

## 2013-08-28 DIAGNOSIS — IMO0002 Reserved for concepts with insufficient information to code with codable children: Secondary | ICD-10-CM | POA: Insufficient documentation

## 2013-08-28 DIAGNOSIS — I10 Essential (primary) hypertension: Secondary | ICD-10-CM

## 2013-08-28 DIAGNOSIS — I119 Hypertensive heart disease without heart failure: Secondary | ICD-10-CM

## 2013-08-28 DIAGNOSIS — E785 Hyperlipidemia, unspecified: Secondary | ICD-10-CM

## 2013-08-28 DIAGNOSIS — E1122 Type 2 diabetes mellitus with diabetic chronic kidney disease: Secondary | ICD-10-CM | POA: Insufficient documentation

## 2013-08-28 DIAGNOSIS — C9210 Chronic myeloid leukemia, BCR/ABL-positive, not having achieved remission: Secondary | ICD-10-CM

## 2013-08-28 DIAGNOSIS — E119 Type 2 diabetes mellitus without complications: Secondary | ICD-10-CM

## 2013-08-28 NOTE — Patient Instructions (Addendum)
Your physician has recommended you make the following change in your medication: reduce your asiprin down to 81 mg.  Your physician recommends that you schedule a follow-up appointment in: 1 YEAR.

## 2013-08-28 NOTE — Progress Notes (Signed)
Patient ID: Dylan Spears, male   DOB: November 06, 1956, 57 y.o.   MRN: [id number]       HPI: Dylan Spears, is a 57 y.o. male who presents to the office today for one-year cardiology evaluation. He is followed by Dr. Phillips Odor for primary medical care.  Dylan Spears has a history of CML which was originally diagnosed in 2004, as well as a history of hypertension, hyperlipidemia, and type 2 diabetes mellitus. In 2010 he developed nitrate responsive chest pain which led to cardiac catheterization. Was not found to have significant obstructive disease. In 2013 because of recurrent chest pain symptoms and echo Doppler study as well as Myoview scan were done. With exercise Myoview he exercised to a 9 minute workload. There was a question of borderline mild ischemia in the basal inferior wall. Ejection fraction is 54%. An echo Doppler study showed normal systolic and diastolic function. There was a question of pseudo-dyskinesis of the inferior wall and in [place] diaphragmatic pressure.   He had mild aortic sclerosis.  Over the past year, Dylan Spears states that he has done fairly well. He denies recurrent chest pain. His blood pressure has been fairly well controlled. He tells me Dr. Renette Butters recently check complete the laboratory this summer. He was told that things looked good. Upon obtaining these results for my review he is on a 3 drug regimen for blood pressure control consisting of amlodipine 5 mg Pravachol 10 mg and diastolic 5 mg. He's also on atorvastatin 20 mg for his hyperlipidemia. He takes diabetic medicine consisting of Glucovance as well as Januvia.   Past Medical History  Diagnosis Date  . Hypertension   . Diabetes mellitus without complication   . Leukemia   . Chronic back pain   . CML (chronic myelocytic leukemia)   . Hyperlipemia   . History of stress test     myoview study suggested the possibility of basal inferior wall ischemia which was present in all 3 tonographic  views.  Dylan Spears Hx of echocardiogram 05/2012    EF 55%Mildly hypertrophic left ventricle with normal systolic function and early signs of mild diastolic dysfuntion. Moderately dilated left atrium, mild aortic valve sclerosis with out stenosis.    Past Surgical History  Procedure Laterality Date  . Back surgery  12/2006  . Knee surgery  01/1994  . Cardiac catheterization  2010    revealed mild nonobstructive CAD  . Cardiac catheterization  07/2012    This showed essentially normal coronary arteries without obstruction. there was normal LV function.    Allergies  Allergen Reactions  . Tylenol [Acetaminophen] Other (See Comments)    "when taking gleevec, do not take tylenol"    Current Outpatient Prescriptions  Medication Sig Dispense Refill  . amLODipine (NORVASC) 5 MG tablet Take 5 mg by mouth daily.      Dylan Spears aspirin 325 MG tablet Take 325 mg by mouth daily.      Dylan Spears atorvastatin (LIPITOR) 20 MG tablet Take 20 mg by mouth at bedtime.       . cetirizine (ZYRTEC ALLERGY) 10 MG tablet Take 5 mg by mouth daily as needed. For allergies      . gabapentin (NEURONTIN) 300 MG capsule Take 300 mg by mouth daily.      Dylan Spears glyBURIDE-metformin (GLUCOVANCE) 2.5-500 MG per tablet Take 2 tablets by mouth 2 (two) times daily with a meal.      . imatinib (GLEEVEC) 400 MG tablet Take 400 mg by mouth daily. Take  with meals and large glass of water.Caution:Chemotherapy.      . Multiple Vitamin (MULTIVITAMIN WITH MINERALS) TABS Take 1 tablet by mouth daily.      . nebivolol (BYSTOLIC) 5 MG tablet Take 5 mg by mouth daily.      . Omega-3 Fatty Acids (FISH OIL) 1000 MG CAPS Take 1 capsule by mouth daily.      . ramipril (ALTACE) 10 MG capsule Take 10 mg by mouth daily.      . sitaGLIPtin (JANUVIA) 100 MG tablet Take 100 mg by mouth daily.       No current facility-administered medications for this visit.    History   Social History  . Marital Status: Married    Spouse Name: N/A    Number of Children: N/A  .  Years of Education: N/A   Occupational History  . Not on file.   Social History Main Topics  . Smoking status: Never Smoker   . Smokeless tobacco: Not on file  . Alcohol Use: Yes  . Drug Use: No  . Sexual Activity: Not on file   Other Topics Concern  . Not on file   Social History Narrative  . No narrative on file    Family History  Problem Relation Age of Onset  . Cancer Mother   . Cancer Father    Social history is notable in that he is married. He has 2 children. There is no tobacco use. He quit at age 67. He does not routinely exercise. He does note some fatigue. He rarely drinks alcohol.  ROS is negative for fevers, chills or night sweats. He denies bleeding. He denies tremors. He denies wheezing. He doesn't fatigue. He states he did have a sleep study several years ago and was told him of not having sleep apnea. He denies palpitations. Denies chest pressure. Denies change in bowel or bladder habits. There is no nausea or vomiting. He denies GU symptoms. He denies edema. He denies myalgias. He denies tremors.   Other system review is negative.  PE BP 130/82  Pulse 76  Ht 6\' 1"  (1.854 m)  Wt 240 lb (108.863 kg)  BMI 31.67 kg/m2  General: Alert, oriented, no distress.  Skin: normal turgor, no rashes HEENT: Normocephalic, atraumatic. Pupils round and reactive; sclera anicteric;no lid lag.  Nose without nasal septal hypertrophy Mouth/Parynx benign; Mallinpatti scale 2/3 Neck: No JVD, no carotid briuts Lungs: clear to ausculatation and percussion; no wheezing or rales Heart: RRR, s1 s2 normal 1/6 systolic murmur Abdomen: soft, nontender; no hepatosplenomehaly, BS+; abdominal aorta nontender and not dilated by palpation. Pulses 2+ Extremities: no clubbing cyanosis or edema, Homan's sign negative  Neurologic: grossly nonfocal  ECG: Sinus rhythm. Moderate LVH by voltage criteria. Early repolarization in V2  LABS:  BMET    Component Value Date/Time   NA 139 05/01/2009  0535   K 3.6 05/01/2009 0535   CL 109 05/01/2009 0535   CO2 24 05/01/2009 0535   GLUCOSE 129* 05/01/2009 0535   BUN 12 05/01/2009 0535   CREATININE 0.87 05/01/2009 0535   CALCIUM 8.7 05/01/2009 0535   GFRNONAA >60 05/01/2009 0535   GFRAA  Value: >60        The eGFR has been calculated using the MDRD equation. This calculation has not been validated in all clinical situations. eGFR's persistently <60 mL/min signify possible Chronic Kidney Disease. 05/01/2009 0535     Hepatic Function Panel     Component Value Date/Time   PROT 5.5* 05/01/2009  0535   ALBUMIN 3.3* 05/01/2009 0535   AST 19 05/01/2009 0535   ALT 19 05/01/2009 0535   ALKPHOS 34* 05/01/2009 0535   BILITOT 0.5 05/01/2009 0535     CBC    Component Value Date/Time   WBC 7.3 05/02/2009 0547   RBC 4.25 05/02/2009 0547   HGB 13.3 05/02/2009 0547   HCT 39.1 05/02/2009 0547   PLT 236 05/02/2009 0547   MCV 92.1 05/02/2009 0547   MCHC 33.9 05/02/2009 0547   RDW 14.3 05/02/2009 0547   LYMPHSABS 1.9 04/30/2009 1810   MONOABS 0.7 04/30/2009 1810   EOSABS 0.0 04/30/2009 1810   BASOSABS 0.1 04/30/2009 1810     BNP No results found for this basename: probnp    Lipid Panel  No results found for this basename: chol, trig, hdl, cholhdl, vldl, ldlcalc     RADIOLOGY: No results found.    ASSESSMENT AND PLAN: Mr. Henreitta Leber blood pressure today is stable on his 3 drug regimen. I will try to obtain the laboratory results from Dr. Phillips Odor. He now is on Lipitor 20 mg. He states his diabetes has been fairly well controlled. I recommended he reduce his aspirin to 81 mg from 325 mg. I have recommended continued exercise. In the past he's had low HDL levels. I will see him in one year for cardiology reevaluation or sooner if problems arise.    Lennette Bihari, MD, [facility]  08/28/2013 12:48 PM

## 2013-08-31 ENCOUNTER — Encounter: Payer: Self-pay | Admitting: Cardiovascular Disease

## 2013-09-08 DIAGNOSIS — Z23 Encounter for immunization: Secondary | ICD-10-CM | POA: Diagnosis not present

## 2013-10-08 DIAGNOSIS — Z23 Encounter for immunization: Secondary | ICD-10-CM | POA: Diagnosis not present

## 2013-11-04 ENCOUNTER — Other Ambulatory Visit: Payer: Self-pay | Admitting: *Deleted

## 2013-11-04 MED ORDER — NEBIVOLOL HCL 10 MG PO TABS: 10.0000 mg | ORAL_TABLET | Freq: Every day | ORAL | Status: DC

## 2013-11-04 MED ORDER — AMLODIPINE BESYLATE 5 MG PO TABS: 5.0000 mg | ORAL_TABLET | Freq: Every day | ORAL | Status: DC

## 2013-11-04 NOTE — Telephone Encounter (Signed)
Called and spoke with patient, Patient stated he's taking Bystolic 10 mg informed him that he was in the office in October and told Dr Tresa Endo he's taking 5mg  but now he said he's taking 10mg 

## 2013-11-30 DIAGNOSIS — Z6832 Body mass index (BMI) 32.0-32.9, adult: Secondary | ICD-10-CM | POA: Diagnosis not present

## 2013-11-30 DIAGNOSIS — E1149 Type 2 diabetes mellitus with other diabetic neurological complication: Secondary | ICD-10-CM | POA: Diagnosis not present

## 2013-11-30 DIAGNOSIS — I10 Essential (primary) hypertension: Secondary | ICD-10-CM | POA: Diagnosis not present

## 2013-11-30 DIAGNOSIS — E1142 Type 2 diabetes mellitus with diabetic polyneuropathy: Secondary | ICD-10-CM | POA: Diagnosis not present

## 2013-11-30 DIAGNOSIS — E291 Testicular hypofunction: Secondary | ICD-10-CM | POA: Diagnosis not present

## 2013-12-09 DIAGNOSIS — E119 Type 2 diabetes mellitus without complications: Secondary | ICD-10-CM | POA: Diagnosis not present

## 2013-12-09 DIAGNOSIS — M549 Dorsalgia, unspecified: Secondary | ICD-10-CM | POA: Diagnosis not present

## 2013-12-09 DIAGNOSIS — I10 Essential (primary) hypertension: Secondary | ICD-10-CM | POA: Diagnosis not present

## 2013-12-09 DIAGNOSIS — C9210 Chronic myeloid leukemia, BCR/ABL-positive, not having achieved remission: Secondary | ICD-10-CM | POA: Diagnosis not present

## 2013-12-09 DIAGNOSIS — C9211 Chronic myeloid leukemia, BCR/ABL-positive, in remission: Secondary | ICD-10-CM | POA: Diagnosis not present

## 2013-12-14 DIAGNOSIS — K1120 Sialoadenitis, unspecified: Secondary | ICD-10-CM | POA: Diagnosis not present

## 2014-03-30 DIAGNOSIS — I10 Essential (primary) hypertension: Secondary | ICD-10-CM | POA: Diagnosis not present

## 2014-03-30 DIAGNOSIS — E1149 Type 2 diabetes mellitus with other diabetic neurological complication: Secondary | ICD-10-CM | POA: Diagnosis not present

## 2014-03-30 DIAGNOSIS — R6889 Other general symptoms and signs: Secondary | ICD-10-CM | POA: Diagnosis not present

## 2014-03-30 DIAGNOSIS — E785 Hyperlipidemia, unspecified: Secondary | ICD-10-CM | POA: Diagnosis not present

## 2014-03-30 DIAGNOSIS — Z6832 Body mass index (BMI) 32.0-32.9, adult: Secondary | ICD-10-CM | POA: Diagnosis not present

## 2014-05-04 ENCOUNTER — Other Ambulatory Visit: Payer: Self-pay | Admitting: Cardiovascular Disease

## 2014-05-04 NOTE — Telephone Encounter (Signed)
Rx was sent to pharmacy electronically. 

## 2014-08-04 DIAGNOSIS — I10 Essential (primary) hypertension: Secondary | ICD-10-CM | POA: Diagnosis not present

## 2014-08-04 DIAGNOSIS — Z6831 Body mass index (BMI) 31.0-31.9, adult: Secondary | ICD-10-CM | POA: Diagnosis not present

## 2014-08-04 DIAGNOSIS — E119 Type 2 diabetes mellitus without complications: Secondary | ICD-10-CM | POA: Diagnosis not present

## 2014-08-04 DIAGNOSIS — E785 Hyperlipidemia, unspecified: Secondary | ICD-10-CM | POA: Diagnosis not present

## 2014-09-01 DIAGNOSIS — Z23 Encounter for immunization: Secondary | ICD-10-CM | POA: Diagnosis not present

## 2014-09-03 ENCOUNTER — Ambulatory Visit (INDEPENDENT_AMBULATORY_CARE_PROVIDER_SITE_OTHER): Payer: Managed Care, Other (non HMO) | Admitting: Cardiovascular Disease

## 2014-09-03 ENCOUNTER — Encounter: Payer: Self-pay | Admitting: Cardiovascular Disease

## 2014-09-03 VITALS — BP 150/90 | HR 76 | Ht 73.0 in | Wt 235.2 lb

## 2014-09-03 DIAGNOSIS — C9290 Myeloid leukemia, unspecified, not having achieved remission: Secondary | ICD-10-CM

## 2014-09-03 DIAGNOSIS — E119 Type 2 diabetes mellitus without complications: Secondary | ICD-10-CM | POA: Diagnosis not present

## 2014-09-03 DIAGNOSIS — I10 Essential (primary) hypertension: Secondary | ICD-10-CM | POA: Diagnosis not present

## 2014-09-03 DIAGNOSIS — E785 Hyperlipidemia, unspecified: Secondary | ICD-10-CM | POA: Diagnosis not present

## 2014-09-03 DIAGNOSIS — C9210 Chronic myeloid leukemia, BCR/ABL-positive, not having achieved remission: Secondary | ICD-10-CM

## 2014-09-03 NOTE — Patient Instructions (Signed)
Your physician wants you to follow-up in: 1 year or sooner if needed.You will receive a reminder letter in the mail two months in advance. If you don't receive a letter, please call our office to schedule the follow-up appointment. No changes were made today in your therapy.

## 2014-09-03 NOTE — Progress Notes (Signed)
Patient ID: Dylan Spears, male   DOB: Nov 03, 1956, 58 y.o.   MRN: [id number]        HPI: Dylan Spears, is a 58 y.o. male who presents to the office today for one-year cardiology evaluation. He is followed by Dr. Hilma Favors for primary medical care.  Dylan Spears has a history of CML which was originally diagnosed in 2004, as well as a history of hypertension, hyperlipidemia, and type 2 diabetes mellitus. In 2010 he developed nitrate responsive chest pain which led to cardiac catheterization.  Catheterization revealed normal coronary arteries and normal LV function. In 2013 because of recurrent chest pain symptoms and echo Doppler study as well as Myoview scan were done. With exercise Myoview he exercised to a 9 minute workload. There was a question of borderline mild ischemia in the basal inferior wall. Ejection fraction is 54%. An echo Doppler study showed normal systolic and diastolic function. There was a question of pseudo-dyskinesis of the inferior wall due to increased infradiaphragmatic pressure.   He had mild aortic sclerosis.  14.  Over the past year, he has lost approximately 5 pounds.  He states his blood pressure at home typically has been running in the 120s 8/68 range.  He does have issues with chronic back discomfort for which he takes Neurontin with improvement.  With reference to his hypertension.  He has been on amlodipine 5 mg Bystolic 10 mg in [place] 10 mg daily.  He has type 2 diabetes mellitus and is on Glucovance 2.5/502 tablets twice a day and Januvia 100 mg.  He denies chest pain.  He denies palpitations.  At times he notes some mild shortness of breath with significant activity.   Past Medical History  Diagnosis Date  . Hypertension   . Diabetes mellitus without complication   . Leukemia   . Chronic back pain   . CML (chronic myelocytic leukemia)   . Hyperlipemia   . History of stress test     myoview study suggested the possibility of basal inferior wall  ischemia which was present in all 3 tonographic views.  [name] Hx of echocardiogram 05/2012    EF 55%Mildly hypertrophic left ventricle with normal systolic function and early signs of mild diastolic dysfuntion. Moderately dilated left atrium, mild aortic valve sclerosis with out stenosis.    Past Surgical History  Procedure Laterality Date  . Back surgery  12/2006  . Knee surgery  01/1994  . Cardiac catheterization  2010    revealed mild nonobstructive CAD  . Cardiac catheterization  07/2012    This showed essentially normal coronary arteries without obstruction. there was normal LV function.    Allergies  Allergen Reactions  . Tylenol [Acetaminophen] Other (See Comments)    "when taking gleevec, do not take tylenol"    Current Outpatient Prescriptions  Medication Sig Dispense Refill  . amLODipine (NORVASC) 5 MG tablet TAKE 1 TABLET BY MOUTH ONCE A DAY FOR BLOOD PRESSURE.  30 tablet  4  . aspirin 325 MG tablet Take 325 mg by mouth daily.      [name] atorvastatin (LIPITOR) 20 MG tablet Take 20 mg by mouth at bedtime.       [name] BYSTOLIC 10 MG tablet TAKE ONE TABLET BY MOUTH EVERY DAY.  30 tablet  4  . cetirizine (ZYRTEC ALLERGY) 10 MG tablet Take 5 mg by mouth daily as needed. For allergies      . gabapentin (NEURONTIN) 300 MG capsule Take 300 mg by mouth daily.      [name]  glyBURIDE-metformin (GLUCOVANCE) 2.5-500 MG per tablet Take 2 tablets by mouth 2 (two) times daily with a meal.      . imatinib (GLEEVEC) 400 MG tablet Take 400 mg by mouth daily. Take with meals and large glass of water.Caution:Chemotherapy.      . Multiple Vitamin (MULTIVITAMIN WITH MINERALS) TABS Take 1 tablet by mouth daily.      . Omega-3 Fatty Acids (FISH OIL) 1000 MG CAPS Take 1 capsule by mouth daily.      . ramipril (ALTACE) 10 MG capsule Take 10 mg by mouth daily.      . sitaGLIPtin (JANUVIA) 100 MG tablet Take 100 mg by mouth daily.       No current facility-administered medications for this visit.    History    Social History  . Marital Status: Married    Spouse Name: N/A    Number of Children: N/A  . Years of Education: N/A   Occupational History  . Not on file.   Social History Main Topics  . Smoking status: Never Smoker   . Smokeless tobacco: Never Used  . Alcohol Use: Yes  . Drug Use: No  . Sexual Activity: Not on file   Other Topics Concern  . Not on file   Social History Narrative  . No narrative on file    Family History  Problem Relation Age of Onset  . Cancer Mother   . Cancer Father    Social history is notable in that he is married. He has 2 children. There is no tobacco use. He quit at age 64. He does not routinely exercise. He does note some fatigue. He rarely drinks alcohol.  ROS General: Negative; No fevers, chills, or night sweats;  HEENT: Negative; No changes in vision or hearing, sinus congestion, difficulty swallowing Pulmonary: Negative; No cough, wheezing, shortness of breath, hemoptysis Cardiovascular: Negative; No chest pain, presyncope, syncope, palpitations GI: Negative; No nausea, vomiting, diarrhea, or abdominal pain GU: Negative; No dysuria, hematuria, or difficulty voiding Musculoskeletal: Negative; no myalgias, joint pain, or weakness Hematologic/Oncology: Negative; no easy bruising, bleeding Endocrine: Negative; no heat/cold intolerance; no diabetes Neuro: Negative; no changes in balance, headaches Skin: Negative; No rashes or skin lesions Psychiatric: Negative; No behavioral problems, depression Sleep: Negative; No snoring, daytime sleepiness, hypersomnolence, bruxism, restless legs, hypnogognic hallucinations, no cataplexy Other comprehensive 14 point system review is negative.  PE BP 150/90  Pulse 76  Ht '6\' 1"'  (1.854 m)  Wt 235 lb 3.2 oz (106.686 kg)  BMI 31.04 kg/m2  General: Alert, oriented, no distress.  Skin: normal turgor, no rashes HEENT: Normocephalic, atraumatic. Pupils round and reactive; sclera anicteric;no lid lag.  Nose  without nasal septal hypertrophy Mouth/Parynx benign; Mallinpatti scale 2 Neck: No JVD, no carotid bruits with normal carotid upstroke Lungs: clear to ausculatation and percussion; no wheezing or rales Chest wall: Nontender to palpation Heart: RRR, s1 s2 normal 1/6 systolic murmur; no S3 or S4 gallop.  No diastolic murmur.  No rubs, thrills or heaves. Abdomen: soft, nontender; no hepatosplenomehaly, BS+; abdominal aorta nontender and not dilated by palpation. Back: no CVA tenderness Pulses 2+ Extremities: no clubbing cyanosis or edema, Homan's sign negative  Neurologic: grossly nonfocal; cranial nerves grossly normal Psychological: Normal affect and mood  ECG (independently read by me): Sinus rhythm at 76 beats per minute.  LVH by voltage criteria in lead aVL  08/2013 ECG: Sinus rhythm. Moderate LVH by voltage criteria. Early repolarization in V2  LABS:  BMET    Component  Value Date/Time   NA 139 05/01/2009 0535   K 3.6 05/01/2009 0535   CL 109 05/01/2009 0535   CO2 24 05/01/2009 0535   GLUCOSE 129* 05/01/2009 0535   BUN 12 05/01/2009 0535   CREATININE 0.87 05/01/2009 0535   CALCIUM 8.7 05/01/2009 0535   GFRNONAA >60 05/01/2009 0535   GFRAA  Value: >60        The eGFR has been calculated using the MDRD equation. This calculation has not been validated in all clinical situations. eGFR's persistently <60 mL/min signify possible Chronic Kidney Disease. 05/01/2009 0535     Hepatic Function Panel     Component Value Date/Time   PROT 5.5* 05/01/2009 0535   ALBUMIN 3.3* 05/01/2009 0535   AST 19 05/01/2009 0535   ALT 19 05/01/2009 0535   ALKPHOS 34* 05/01/2009 0535   BILITOT 0.5 05/01/2009 0535     CBC    Component Value Date/Time   WBC 7.3 05/02/2009 0547   RBC 4.25 05/02/2009 0547   HGB 13.3 05/02/2009 0547   HCT 39.1 05/02/2009 0547   PLT 236 05/02/2009 0547   MCV 92.1 05/02/2009 0547   MCHC 33.9 05/02/2009 0547   RDW 14.3 05/02/2009 0547   LYMPHSABS 1.9 04/30/2009 1810   MONOABS 0.7 04/30/2009 1810   EOSABS 0.0  04/30/2009 1810   BASOSABS 0.1 04/30/2009 1810     BNP No results found for this basename: probnp    Lipid Panel  No results found for this basename: chol,  trig,  hdl,  cholhdl,  vldl,  ldlcalc     RADIOLOGY: No results found.    ASSESSMENT AND PLAN: Mr. Dylan Spears is a 58 year old gentleman, who has a history of hypertension with documented mild left ventricular hypertrophy on echocardiography and also by ECG criteria.  His blood pressure today is controlled and repeat by me was 130/70 on his medical regimen consisting of ramipril, 10 mg, diastolic 10 mg and amlodipine 5 mg.  He's not having any edema.  He's not having any chest pain.  He does have a history of CML, and this has been stable and he continues to be on Gleevec 400 mg daily.  He does have hyperlipidemia and is on atorvastatin 20 mg.  He tells me Dr. Hilma Favors had recently checked blood work and he was told that his values were very good.  He is diabetic on Glucovance as well as Januvia.  His most recent hemoglobin A1c was 6.7 on therapy.  He has lost 5 pounds over the past year.  He is still in the mild obese range by body mass index criteria.  I didn't recommended weight loss and exercise.  I will see him in one year for reevaluation or sooner proms arise.  Troy Sine, MD, [facility]  09/03/2014 10:49 AM

## 2014-09-30 ENCOUNTER — Other Ambulatory Visit: Payer: Self-pay | Admitting: Cardiovascular Disease

## 2014-11-04 ENCOUNTER — Encounter (HOSPITAL_COMMUNITY): Payer: Self-pay | Admitting: Cardiovascular Disease

## 2014-12-03 DIAGNOSIS — M179 Osteoarthritis of knee, unspecified: Secondary | ICD-10-CM | POA: Diagnosis not present

## 2014-12-06 DIAGNOSIS — E782 Mixed hyperlipidemia: Secondary | ICD-10-CM | POA: Diagnosis not present

## 2014-12-06 DIAGNOSIS — G894 Chronic pain syndrome: Secondary | ICD-10-CM | POA: Diagnosis not present

## 2014-12-06 DIAGNOSIS — I10 Essential (primary) hypertension: Secondary | ICD-10-CM | POA: Diagnosis not present

## 2014-12-06 DIAGNOSIS — Z6832 Body mass index (BMI) 32.0-32.9, adult: Secondary | ICD-10-CM | POA: Diagnosis not present

## 2014-12-06 DIAGNOSIS — E119 Type 2 diabetes mellitus without complications: Secondary | ICD-10-CM | POA: Diagnosis not present

## 2014-12-07 DIAGNOSIS — E119 Type 2 diabetes mellitus without complications: Secondary | ICD-10-CM | POA: Diagnosis not present

## 2014-12-07 DIAGNOSIS — E782 Mixed hyperlipidemia: Secondary | ICD-10-CM | POA: Diagnosis not present

## 2014-12-07 DIAGNOSIS — G894 Chronic pain syndrome: Secondary | ICD-10-CM | POA: Diagnosis not present

## 2014-12-07 DIAGNOSIS — Z6832 Body mass index (BMI) 32.0-32.9, adult: Secondary | ICD-10-CM | POA: Diagnosis not present

## 2014-12-23 DIAGNOSIS — C9210 Chronic myeloid leukemia, BCR/ABL-positive, not having achieved remission: Secondary | ICD-10-CM | POA: Diagnosis not present

## 2014-12-23 DIAGNOSIS — C9211 Chronic myeloid leukemia, BCR/ABL-positive, in remission: Secondary | ICD-10-CM | POA: Diagnosis not present

## 2014-12-30 ENCOUNTER — Other Ambulatory Visit: Payer: Self-pay | Admitting: Orthopedic Surgery

## 2014-12-30 ENCOUNTER — Ambulatory Visit
Admission: RE | Admit: 2014-12-30 | Discharge: 2014-12-30 | Disposition: A | Discharge status: Home / Self Care | Payer: Managed Care, Other (non HMO) | Source: Ambulatory Visit | Attending: Orthopedic Surgery | Admitting: Orthopedic Surgery

## 2014-12-30 DIAGNOSIS — M47817 Spondylosis without myelopathy or radiculopathy, lumbosacral region: Secondary | ICD-10-CM | POA: Diagnosis not present

## 2014-12-30 DIAGNOSIS — M549 Dorsalgia, unspecified: Secondary | ICD-10-CM

## 2014-12-30 DIAGNOSIS — M5136 Other intervertebral disc degeneration, lumbar region: Secondary | ICD-10-CM | POA: Diagnosis not present

## 2015-03-07 ENCOUNTER — Other Ambulatory Visit (HOSPITAL_COMMUNITY): Payer: Self-pay | Admitting: Nephrology

## 2015-03-07 DIAGNOSIS — N183 Chronic kidney disease, stage 3 unspecified: Secondary | ICD-10-CM

## 2015-03-18 ENCOUNTER — Ambulatory Visit (HOSPITAL_COMMUNITY)
Admission: RE | Admit: 2015-03-18 | Discharge: 2015-03-18 | Disposition: A | Discharge status: Home / Self Care | Payer: Managed Care, Other (non HMO) | Source: Ambulatory Visit | Attending: Nephrology | Admitting: Nephrology

## 2015-03-18 DIAGNOSIS — N183 Chronic kidney disease, stage 3 unspecified: Secondary | ICD-10-CM

## 2015-03-24 ENCOUNTER — Other Ambulatory Visit: Payer: Self-pay | Admitting: Orthopedic Surgery

## 2015-03-24 DIAGNOSIS — M5136 Other intervertebral disc degeneration, lumbar region: Secondary | ICD-10-CM

## 2015-03-29 ENCOUNTER — Other Ambulatory Visit: Payer: Self-pay | Admitting: Cardiovascular Disease

## 2015-04-12 ENCOUNTER — Ambulatory Visit
Admission: RE | Admit: 2015-04-12 | Discharge: 2015-04-12 | Disposition: A | Discharge status: Home / Self Care | Payer: Managed Care, Other (non HMO) | Source: Ambulatory Visit | Attending: Orthopedic Surgery | Admitting: Orthopedic Surgery

## 2015-04-12 DIAGNOSIS — E119 Type 2 diabetes mellitus without complications: Secondary | ICD-10-CM | POA: Diagnosis not present

## 2015-04-12 DIAGNOSIS — Z0000 Encounter for general adult medical examination without abnormal findings: Secondary | ICD-10-CM | POA: Diagnosis not present

## 2015-04-12 DIAGNOSIS — M5136 Other intervertebral disc degeneration, lumbar region: Secondary | ICD-10-CM

## 2015-04-12 DIAGNOSIS — Z1389 Encounter for screening for other disorder: Secondary | ICD-10-CM | POA: Diagnosis not present

## 2015-04-12 DIAGNOSIS — Z6831 Body mass index (BMI) 31.0-31.9, adult: Secondary | ICD-10-CM | POA: Diagnosis not present

## 2015-08-30 DIAGNOSIS — Z23 Encounter for immunization: Secondary | ICD-10-CM | POA: Diagnosis not present

## 2015-09-09 DIAGNOSIS — N183 Chronic kidney disease, stage 3 (moderate): Secondary | ICD-10-CM | POA: Diagnosis not present

## 2015-09-09 DIAGNOSIS — E782 Mixed hyperlipidemia: Secondary | ICD-10-CM | POA: Diagnosis not present

## 2015-09-09 DIAGNOSIS — E1129 Type 2 diabetes mellitus with other diabetic kidney complication: Secondary | ICD-10-CM | POA: Diagnosis not present

## 2015-09-09 DIAGNOSIS — Z6832 Body mass index (BMI) 32.0-32.9, adult: Secondary | ICD-10-CM | POA: Diagnosis not present

## 2015-09-09 DIAGNOSIS — Z1389 Encounter for screening for other disorder: Secondary | ICD-10-CM | POA: Diagnosis not present

## 2015-09-09 DIAGNOSIS — E6609 Other obesity due to excess calories: Secondary | ICD-10-CM | POA: Diagnosis not present

## 2015-09-09 DIAGNOSIS — E1140 Type 2 diabetes mellitus with diabetic neuropathy, unspecified: Secondary | ICD-10-CM | POA: Diagnosis not present

## 2015-09-09 DIAGNOSIS — I10 Essential (primary) hypertension: Secondary | ICD-10-CM | POA: Diagnosis not present

## 2015-09-09 DIAGNOSIS — E1165 Type 2 diabetes mellitus with hyperglycemia: Secondary | ICD-10-CM | POA: Diagnosis not present

## 2015-09-28 ENCOUNTER — Other Ambulatory Visit: Payer: Self-pay | Admitting: Cardiovascular Disease

## 2015-09-30 ENCOUNTER — Ambulatory Visit (INDEPENDENT_AMBULATORY_CARE_PROVIDER_SITE_OTHER): Payer: Managed Care, Other (non HMO) | Admitting: Cardiovascular Disease

## 2015-09-30 VITALS — BP 154/84 | HR 66 | Ht 73.0 in | Wt 236.9 lb

## 2015-09-30 DIAGNOSIS — E785 Hyperlipidemia, unspecified: Secondary | ICD-10-CM

## 2015-09-30 DIAGNOSIS — C9210 Chronic myeloid leukemia, BCR/ABL-positive, not having achieved remission: Secondary | ICD-10-CM | POA: Diagnosis not present

## 2015-09-30 DIAGNOSIS — I10 Essential (primary) hypertension: Secondary | ICD-10-CM | POA: Diagnosis not present

## 2015-09-30 DIAGNOSIS — N529 Male erectile dysfunction, unspecified: Secondary | ICD-10-CM

## 2015-09-30 NOTE — Patient Instructions (Signed)
Your physician wants you to follow-up in: 1 YEAR or sooner if needed. You will receive a reminder letter in the mail two months in advance. If you don't receive a letter, please call our office to schedule the follow-up appointment.  If you need a refill on your cardiac medications before your next appointment, please call your pharmacy.

## 2015-10-02 ENCOUNTER — Encounter: Payer: Self-pay | Admitting: Cardiovascular Disease

## 2015-10-02 DIAGNOSIS — N529 Male erectile dysfunction, unspecified: Secondary | ICD-10-CM | POA: Insufficient documentation

## 2015-10-02 NOTE — Progress Notes (Signed)
Patient ID: Tovia Kisner, male   DOB: 1956/11/09, 59 y.o.   MRN: [id number]      HPI: Jaydyn Bozzo, is a 59 y.o. male who presents to the office today for one-year cardiology evaluation. He is followed by Dr. Hilma Favors for primary medical care.  Mr. Lamison has a history of CML which was originally diagnosed in 2004, as well as a history of hypertension, hyperlipidemia, and type 2 diabetes mellitus. In 2010 he developed nitrate responsive chest pain which led to cardiac catheterization.  Catheterization revealed normal coronary arteries and normal LV function. In 2013 because of recurrent chest pain symptoms and echo Doppler study as well as Myoview scan were done. With exercise Myoview he exercised to a 9 minute workload. There was a question of borderline mild ischemia in the basal inferior wall. Ejection fraction is 54%. An echo Doppler study showed normal systolic and diastolic function. There was a question of pseudo-dyskinesis of the inferior wall due to increased infradiaphragmatic pressure.   He had mild aortic sclerosis.  Since I saw him one year ago.  He continues to do well.  He specifically denies any chest tightness.  He denies palpitations.  At times he notes some mild shortness of breath with significant activity. He has a history of hypertension and has been on ramipril 10 mg, Bystolic 10 mg and amlodipine 5 mg.  He is on [place] for his CML.  He takes gabapentin for peripheral neuropathy.  He is a history of hyperlipidemia and is tolerated atorvastatin 20 mg.  He recently has had issues with erectile dysfunction and had been given a prescription for generic sildinifil by Dr. Hilma Favors.  He has type 2 diabetes mellitus, on Januvia.  He presents for one-year evaluation.  Past Medical History  Diagnosis Date  . Hypertension   . Diabetes mellitus without complication ([place])   . Leukemia ([place])   . Chronic back pain   . CML (chronic myelocytic leukemia) ([place])   . Hyperlipemia   .  History of stress test     myoview study suggested the possibility of basal inferior wall ischemia which was present in all 3 tonographic views.  [name] Hx of echocardiogram 05/2012    EF 55%Mildly hypertrophic left ventricle with normal systolic function and early signs of mild diastolic dysfuntion. Moderately dilated left atrium, mild aortic valve sclerosis with out stenosis.    Past Surgical History  Procedure Laterality Date  . Back surgery  12/2006  . Knee surgery  01/1994  . Cardiac catheterization  2010    revealed mild nonobstructive CAD  . Cardiac catheterization  07/2012    This showed essentially normal coronary arteries without obstruction. there was normal LV function.  . Left heart catheterization with coronary angiogram N/A 07/29/2012    Procedure: LEFT HEART CATHETERIZATION WITH CORONARY ANGIOGRAM;  Surgeon: Troy Sine, MD;  Location: [facility] CATH LAB;  Service: Cardiovascular;  Laterality: N/A;    Allergies  Allergen Reactions  . Tylenol [Acetaminophen] Other (See Comments)    "when taking gleevec, do not take tylenol"    Current Outpatient Prescriptions  Medication Sig Dispense Refill  . amLODipine (NORVASC) 5 MG tablet TAKE 1 TABLET BY MOUTH ONCE A DAY FOR BLOOD PRESSURE. 30 tablet 0  . aspirin 81 MG tablet Take 81 mg by mouth daily.    [name] atorvastatin (LIPITOR) 20 MG tablet Take 20 mg by mouth at bedtime.     [name] BYSTOLIC 10 MG tablet TAKE ONE TABLET BY MOUTH EVERY  DAY. 30 tablet 0  . cetirizine (ZYRTEC ALLERGY) 10 MG tablet Take 5 mg by mouth daily as needed. For allergies    . dexamethasone (DECADRON) 4 MG tablet Take 1 tablet by mouth as needed.    . etodolac (LODINE) 400 MG tablet Take 1 tablet by mouth as needed.    . gabapentin (NEURONTIN) 300 MG capsule Take 300 mg by mouth daily.    [name] glyBURIDE-metformin (GLUCOVANCE) 2.5-500 MG per tablet Take 2 tablets by mouth 2 (two) times daily with a meal.    . imatinib (GLEEVEC) 400 MG tablet Take 400 mg by mouth daily. Take  with meals and large glass of water.Caution:Chemotherapy.    . Multiple Vitamin (MULTIVITAMIN WITH MINERALS) TABS Take 1 tablet by mouth daily.    . Omega-3 Fatty Acids (FISH OIL) 1000 MG CAPS Take 1 capsule by mouth daily.    [name] oxyCODONE (OXY IR/ROXICODONE) 5 MG immediate release tablet Take 1 tablet by mouth as needed.    . ramipril (ALTACE) 10 MG capsule Take 10 mg by mouth daily.    . sitaGLIPtin (JANUVIA) 100 MG tablet Take 100 mg by mouth daily.     No current facility-administered medications for this visit.    Social History   Social History  . Marital Status: Married    Spouse Name: N/A  . Number of Children: N/A  . Years of Education: N/A   Occupational History  . Not on file.   Social History Main Topics  . Smoking status: Never Smoker   . Smokeless tobacco: Never Used  . Alcohol Use: Yes  . Drug Use: No  . Sexual Activity: Not on file   Other Topics Concern  . Not on file   Social History Narrative    Family History  Problem Relation Age of Onset  . Cancer Mother   . Cancer Father    Social history is notable in that he is married. He has 2 children. There is no tobacco use. He quit at age 62. He does not routinely exercise. He does note some fatigue. He rarely drinks alcohol.  ROS General: Negative; No fevers, chills, or night sweats;  HEENT: Negative; No changes in vision or hearing, sinus congestion, difficulty swallowing Pulmonary: Negative; No cough, wheezing, shortness of breath, hemoptysis Cardiovascular: Negative; No chest pain, presyncope, syncope, palpitations GI: Negative; No nausea, vomiting, diarrhea, or abdominal pain GU: Mild erectile dysfunction Musculoskeletal: Negative; no myalgias, joint pain, or weakness Hematologic/Oncology: Positive for CML Endocrine: Positive for type 2 diabetes mellitus Neuro: Negative; no changes in balance, headaches Skin: Negative; No rashes or skin lesions Psychiatric: Negative; No behavioral problems,  depression Sleep: Negative; No snoring, daytime sleepiness, hypersomnolence, bruxism, restless legs, hypnogognic hallucinations, no cataplexy Other comprehensive 14 point system review is negative.  PE BP 154/84 mmHg  Pulse 66  Ht '6\' 1"'  (1.854 m)  Wt 236 lb 14.4 oz (107.457 kg)  BMI 31.26 kg/m2  Repeat blood pressure by me 140/82. Wt Readings from Last 3 Encounters:  09/30/15 236 lb 14.4 oz (107.457 kg)  09/03/14 235 lb 3.2 oz (106.686 kg)  08/28/13 240 lb (108.863 kg)   General: Alert, oriented, no distress.  Skin: normal turgor, no rashes HEENT: Normocephalic, atraumatic. Pupils round and reactive; sclera anicteric;no lid lag.  Nose without nasal septal hypertrophy Mouth/Parynx benign; Mallinpatti scale 2 Neck: No JVD, no carotid bruits with normal carotid upstroke Lungs: clear to ausculatation and percussion; no wheezing or rales Chest wall: Nontender to palpation Heart: RRR, s1  s2 normal 1/6 systolic murmur; no S3 or S4 gallop.  No diastolic murmur.  No rubs, thrills or heaves. Abdomen: soft, nontender; no hepatosplenomehaly, BS+; abdominal aorta nontender and not dilated by palpation. Back: no CVA tenderness Pulses 2+ Extremities: no clubbing cyanosis or edema, Homan's sign negative  Neurologic: grossly nonfocal; cranial nerves grossly normal Psychological: Normal affect and mood  ECG (independently read by me): Normal sinus rhythm at 66.  LVH by voltage criteria.  October 2015 ECG (independently read by me): Sinus rhythm at 76 beats per minute.  LVH by voltage criteria in lead aVL  08/2013 ECG: Sinus rhythm. Moderate LVH by voltage criteria. Early repolarization in V2  LABS:  BMP Latest Ref Rng 05/01/2009 04/30/2009 01/20/2008  Glucose 70 - 99 mg/dL 129(H) 202(H) 89  BUN 6 - 23 mg/dL '12 19 12  ' Creatinine 0.4 - 1.5 mg/dL 0.87 1.07 1.03  Sodium 135 - 145 mEq/L 139 137 136  Potassium 3.5 - 5.1 mEq/L 3.6 4.0 3.9  Chloride 96 - 112 mEq/L 109 103 104  CO2 19 - 32 mEq/L '24 23  29  ' Calcium 8.4 - 10.5 mg/dL 8.7 9.5 9.5   Hepatic Function Latest Ref Rng 05/01/2009 01/20/2008  Total Protein 6.0 - 8.3 g/dL 5.5(L) 6.4  Albumin 3.5 - 5.2 g/dL 3.3(L) 3.9  AST 0 - 37 U/L 19 18  ALT 0 - 53 U/L 19 20  Alk Phosphatase 39 - 117 U/L 34(L) 44  Total Bilirubin 0.3 - 1.2 mg/dL 0.5 0.6   CBC Latest Ref Rng 05/02/2009 05/01/2009 04/30/2009  WBC 4.0 - 10.5 K/uL 7.3 7.5 9.9  Hemoglobin 13.0 - 17.0 g/dL 13.3 12.7(L) 14.6  Hematocrit 39.0 - 52.0 % 39.1 36.8(L) 43.8  Platelets 150 - 400 K/uL 236 216 276   Lab Results  Component Value Date   MCV 92.1 05/02/2009   MCV 91.3 05/01/2009   MCV 92.0 04/30/2009            05/01/2009   Lab Results  Component Value Date   HGBA1C * 05/01/2009    8.0 (NOTE) The ADA recommends the following therapeutic goal for glycemic control related to Hgb A1c measurement: Goal of therapy: <6.5 Hgb A1c  Reference: American Diabetes Association: Clinical Practice Recommendations 2010, Diabetes Care, 2010, 33: (Suppl  1).  Lipid Panel  No results found for: CHOL, TRIG, HDL, CHOLHDL, VLDL, LDLCALC, LDLDIRECT   RADIOLOGY: No results found.    ASSESSMENT AND PLAN: Mr. Alfonsa Vaile is a 59 year old African-American gentleman gentleman who has a history of hypertension with documented mild left ventricular hypertrophy on echocardiography and also by ECG criteria.  His blood pressure today up her normal on repeat by me at 140/82 on his medical regimen consisting of ramipril, 10 mg, bystolic 10 mg and amlodipine 5 mg. He tells me his blood pressure at home typically runs in the 130s to up to 140 range.  He is tolerating amlodipine without edema.  He's not having any chest pain.  He does have a history of CML, and this has been stable and he continues to be on Gleevec 400 mg daily.  He does have hyperlipidemia and is on atorvastatin 20 mg. he has had lab work by Dr. Hilma Favors and he was told that these were all excellent and no adjustments to his medical regimen  was recommended.  I will try to obtain these results.  He does have mild obesity with a BMI of 31.26 kg/m.  He also has erectile dysfunction.  Presently, he  is not on any medications which would contraindicate single will use.  I discussed with him the generic 20 mg version may not be adequate and if necessary, he may need to take higher dosing if he has not seen any benefit from the lower dose form.  As long as he remains stable, I'll see him in one year for reevaluation. Troy Sine, MD, [facility]  10/02/2015 12:40 PM

## 2015-10-04 ENCOUNTER — Encounter: Payer: Self-pay | Admitting: Cardiovascular Disease

## 2015-11-17 ENCOUNTER — Encounter: Payer: Self-pay | Admitting: *Deleted

## 2015-11-26 ENCOUNTER — Other Ambulatory Visit: Payer: Self-pay | Admitting: Cardiovascular Disease

## 2015-11-29 NOTE — Telephone Encounter (Signed)
Rx request sent to pharmacy.  

## 2015-12-20 DIAGNOSIS — E6609 Other obesity due to excess calories: Secondary | ICD-10-CM | POA: Diagnosis not present

## 2015-12-20 DIAGNOSIS — E1165 Type 2 diabetes mellitus with hyperglycemia: Secondary | ICD-10-CM | POA: Diagnosis not present

## 2015-12-20 DIAGNOSIS — E782 Mixed hyperlipidemia: Secondary | ICD-10-CM | POA: Diagnosis not present

## 2015-12-20 DIAGNOSIS — Z6832 Body mass index (BMI) 32.0-32.9, adult: Secondary | ICD-10-CM | POA: Diagnosis not present

## 2015-12-20 DIAGNOSIS — I10 Essential (primary) hypertension: Secondary | ICD-10-CM | POA: Diagnosis not present

## 2015-12-20 DIAGNOSIS — Z1389 Encounter for screening for other disorder: Secondary | ICD-10-CM | POA: Diagnosis not present

## 2015-12-20 DIAGNOSIS — E1140 Type 2 diabetes mellitus with diabetic neuropathy, unspecified: Secondary | ICD-10-CM | POA: Diagnosis not present

## 2016-03-19 DIAGNOSIS — E1129 Type 2 diabetes mellitus with other diabetic kidney complication: Secondary | ICD-10-CM | POA: Diagnosis not present

## 2016-03-19 DIAGNOSIS — E1165 Type 2 diabetes mellitus with hyperglycemia: Secondary | ICD-10-CM | POA: Diagnosis not present

## 2016-03-19 DIAGNOSIS — Z1389 Encounter for screening for other disorder: Secondary | ICD-10-CM | POA: Diagnosis not present

## 2016-03-19 DIAGNOSIS — I10 Essential (primary) hypertension: Secondary | ICD-10-CM | POA: Diagnosis not present

## 2016-03-19 DIAGNOSIS — E6609 Other obesity due to excess calories: Secondary | ICD-10-CM | POA: Diagnosis not present

## 2016-03-19 DIAGNOSIS — Z6832 Body mass index (BMI) 32.0-32.9, adult: Secondary | ICD-10-CM | POA: Diagnosis not present

## 2016-03-19 DIAGNOSIS — E782 Mixed hyperlipidemia: Secondary | ICD-10-CM | POA: Diagnosis not present

## 2016-03-19 DIAGNOSIS — E1140 Type 2 diabetes mellitus with diabetic neuropathy, unspecified: Secondary | ICD-10-CM | POA: Diagnosis not present

## 2016-04-05 DIAGNOSIS — I10 Essential (primary) hypertension: Secondary | ICD-10-CM | POA: Diagnosis not present

## 2016-04-05 DIAGNOSIS — Z79899 Other long term (current) drug therapy: Secondary | ICD-10-CM | POA: Diagnosis not present

## 2016-04-05 DIAGNOSIS — E559 Vitamin D deficiency, unspecified: Secondary | ICD-10-CM | POA: Diagnosis not present

## 2016-04-05 DIAGNOSIS — R809 Proteinuria, unspecified: Secondary | ICD-10-CM | POA: Diagnosis not present

## 2016-04-05 DIAGNOSIS — D509 Iron deficiency anemia, unspecified: Secondary | ICD-10-CM | POA: Diagnosis not present

## 2016-04-11 DIAGNOSIS — I10 Essential (primary) hypertension: Secondary | ICD-10-CM | POA: Diagnosis not present

## 2016-04-11 DIAGNOSIS — N182 Chronic kidney disease, stage 2 (mild): Secondary | ICD-10-CM | POA: Diagnosis not present

## 2016-04-11 DIAGNOSIS — R809 Proteinuria, unspecified: Secondary | ICD-10-CM | POA: Diagnosis not present

## 2016-05-31 DIAGNOSIS — E1140 Type 2 diabetes mellitus with diabetic neuropathy, unspecified: Secondary | ICD-10-CM | POA: Diagnosis not present

## 2016-06-08 DIAGNOSIS — M4316 Spondylolisthesis, lumbar region: Secondary | ICD-10-CM | POA: Diagnosis not present

## 2016-06-08 DIAGNOSIS — M4806 Spinal stenosis, lumbar region: Secondary | ICD-10-CM | POA: Diagnosis not present

## 2016-06-08 DIAGNOSIS — M549 Dorsalgia, unspecified: Secondary | ICD-10-CM | POA: Diagnosis not present

## 2016-06-21 DIAGNOSIS — C9211 Chronic myeloid leukemia, BCR/ABL-positive, in remission: Secondary | ICD-10-CM | POA: Diagnosis not present

## 2016-06-21 DIAGNOSIS — N179 Acute kidney failure, unspecified: Secondary | ICD-10-CM | POA: Diagnosis not present

## 2016-06-25 DIAGNOSIS — E663 Overweight: Secondary | ICD-10-CM | POA: Diagnosis not present

## 2016-06-25 DIAGNOSIS — E782 Mixed hyperlipidemia: Secondary | ICD-10-CM | POA: Diagnosis not present

## 2016-06-25 DIAGNOSIS — I10 Essential (primary) hypertension: Secondary | ICD-10-CM | POA: Diagnosis not present

## 2016-06-25 DIAGNOSIS — N183 Chronic kidney disease, stage 3 (moderate): Secondary | ICD-10-CM | POA: Diagnosis not present

## 2016-06-25 DIAGNOSIS — Z1389 Encounter for screening for other disorder: Secondary | ICD-10-CM | POA: Diagnosis not present

## 2016-06-25 DIAGNOSIS — E1129 Type 2 diabetes mellitus with other diabetic kidney complication: Secondary | ICD-10-CM | POA: Diagnosis not present

## 2016-06-25 DIAGNOSIS — E1165 Type 2 diabetes mellitus with hyperglycemia: Secondary | ICD-10-CM | POA: Diagnosis not present

## 2016-06-25 DIAGNOSIS — G894 Chronic pain syndrome: Secondary | ICD-10-CM | POA: Diagnosis not present

## 2016-06-25 DIAGNOSIS — Z6829 Body mass index (BMI) 29.0-29.9, adult: Secondary | ICD-10-CM | POA: Diagnosis not present

## 2016-06-25 LAB — HEMOGLOBIN A1C: HEMOGLOBIN A1C: 8.9

## 2016-07-19 DIAGNOSIS — I10 Essential (primary) hypertension: Secondary | ICD-10-CM | POA: Diagnosis not present

## 2016-07-19 DIAGNOSIS — Z1389 Encounter for screening for other disorder: Secondary | ICD-10-CM | POA: Diagnosis not present

## 2016-07-19 DIAGNOSIS — Z6830 Body mass index (BMI) 30.0-30.9, adult: Secondary | ICD-10-CM | POA: Diagnosis not present

## 2016-07-19 DIAGNOSIS — E782 Mixed hyperlipidemia: Secondary | ICD-10-CM | POA: Diagnosis not present

## 2016-07-19 DIAGNOSIS — E1165 Type 2 diabetes mellitus with hyperglycemia: Secondary | ICD-10-CM | POA: Diagnosis not present

## 2016-07-19 DIAGNOSIS — N183 Chronic kidney disease, stage 3 (moderate): Secondary | ICD-10-CM | POA: Diagnosis not present

## 2016-07-19 DIAGNOSIS — Z79899 Other long term (current) drug therapy: Secondary | ICD-10-CM | POA: Diagnosis not present

## 2016-07-19 DIAGNOSIS — G894 Chronic pain syndrome: Secondary | ICD-10-CM | POA: Diagnosis not present

## 2016-07-19 DIAGNOSIS — K219 Gastro-esophageal reflux disease without esophagitis: Secondary | ICD-10-CM | POA: Diagnosis not present

## 2016-07-19 DIAGNOSIS — Z23 Encounter for immunization: Secondary | ICD-10-CM | POA: Diagnosis not present

## 2016-08-30 ENCOUNTER — Encounter: Payer: Self-pay | Admitting: "Endocrinology

## 2016-08-30 ENCOUNTER — Ambulatory Visit (INDEPENDENT_AMBULATORY_CARE_PROVIDER_SITE_OTHER): Payer: 59 | Admitting: "Endocrinology

## 2016-08-30 VITALS — BP 143/81 | HR 75 | Ht 71.0 in | Wt 228.0 lb

## 2016-08-30 DIAGNOSIS — I10 Essential (primary) hypertension: Secondary | ICD-10-CM | POA: Diagnosis not present

## 2016-08-30 DIAGNOSIS — IMO0002 Reserved for concepts with insufficient information to code with codable children: Secondary | ICD-10-CM

## 2016-08-30 DIAGNOSIS — E1165 Type 2 diabetes mellitus with hyperglycemia: Secondary | ICD-10-CM

## 2016-08-30 DIAGNOSIS — E782 Mixed hyperlipidemia: Secondary | ICD-10-CM

## 2016-08-30 DIAGNOSIS — N183 Chronic kidney disease, stage 3 (moderate): Secondary | ICD-10-CM | POA: Diagnosis not present

## 2016-08-30 DIAGNOSIS — E1122 Type 2 diabetes mellitus with diabetic chronic kidney disease: Secondary | ICD-10-CM

## 2016-08-30 NOTE — Progress Notes (Signed)
Subjective:    Patient ID: Dylan Spears, male    DOB: Jul 01, 1956. Patient is being seen in consultation for management of diabetes requested by  Purvis Kilts, MD  Past Medical History:  Diagnosis Date  . Chronic back pain   . CML (chronic myelocytic leukemia) ([place])   . Diabetes mellitus without complication ([place])   . History of stress test    myoview study suggested the possibility of basal inferior wall ischemia which was present in all 3 tonographic views.  [name] Hx of echocardiogram 05/2012   EF 55%Mildly hypertrophic left ventricle with normal systolic function and early signs of mild diastolic dysfuntion. Moderately dilated left atrium, mild aortic valve sclerosis with out stenosis.  . Hyperlipemia   . Hypertension   . Leukemia [facility])    Past Surgical History:  Procedure Laterality Date  . BACK SURGERY  12/2006  . CARDIAC CATHETERIZATION  2010   revealed mild nonobstructive CAD  . CARDIAC CATHETERIZATION  07/2012   This showed essentially normal coronary arteries without obstruction. there was normal LV function.  [name] KNEE SURGERY  01/1994  . LEFT HEART CATHETERIZATION WITH CORONARY ANGIOGRAM N/A 07/29/2012   Procedure: LEFT HEART CATHETERIZATION WITH CORONARY ANGIOGRAM;  Surgeon: Troy Sine, MD;  Location: [facility] C[facility] LAB;  Service: Cardiovascular;  Laterality: N/A;   Social History   Social History  . Marital status: Married    Spouse name: N/A  . Number of children: N/A  . Years of education: N/A   Social History Main Topics  . Smoking status: Never Smoker  . Smokeless tobacco: Never Used  . Alcohol use Yes  . Drug use: No  . Sexual activity: Not Asked   Other Topics Concern  . None   Social History Narrative  . None   Outpatient Encounter Prescriptions as of 08/30/2016  Medication Sig  . amLODipine (NORVASC) 5 MG tablet Take 5 mg by mouth daily.  [name] aspirin 81 MG tablet Take 81 mg by mouth daily.  [name] atorvastatin (LIPITOR) 20 MG tablet Take 20 mg by  mouth at bedtime.   [name] BYSTOLIC 10 MG tablet TAKE ONE TABLET BY MOUTH EVERY DAY.  [name] gabapentin (NEURONTIN) 300 MG capsule Take 300 mg by mouth 2 (two) times daily.   [name] imatinib (GLEEVEC) 400 MG tablet Take 400 mg by mouth daily. Take with meals and large glass of water.Caution:Chemotherapy.  . Multiple Vitamin (MULTIVITAMIN WITH MINERALS) TABS Take 1 tablet by mouth daily.  . ramipril (ALTACE) 10 MG capsule Take 5 mg by mouth daily.   . sitaGLIPtin (JANUVIA) 100 MG tablet Take 100 mg by mouth daily.  . [DISCONTINUED] glyBURIDE-metformin (GLUCOVANCE) 2.5-500 MG per tablet Take 2 tablets by mouth 2 (two) times daily with a meal.  . [DISCONTINUED] amLODipine (NORVASC) 5 MG tablet TAKE 1 TABLET BY MOUTH ONCE A DAY FOR BLOOD PRESSURE.  . [DISCONTINUED] cetirizine (ZYRTEC ALLERGY) 10 MG tablet Take 5 mg by mouth daily as needed. For allergies  . [DISCONTINUED] dexamethasone (DECADRON) 4 MG tablet Take 1 tablet by mouth as needed.  . [DISCONTINUED] etodolac (LODINE) 400 MG tablet Take 1 tablet by mouth as needed.  . [DISCONTINUED] Omega-3 Fatty Acids (FISH OIL) 1000 MG CAPS Take 1 capsule by mouth daily.  . [DISCONTINUED] oxyCODONE (OXY IR/ROXICODONE) 5 MG immediate release tablet Take 1 tablet by mouth as needed.   No facility-administered encounter medications on file as of 08/30/2016.    ALLERGIES: Allergies  Allergen Reactions  . Tylenol [Acetaminophen]  Other (See Comments)    "when taking gleevec, do not take tylenol"   VACCINATION STATUS:  There is no immunization history on file for this patient.  Diabetes  He presents for his initial diabetic visit. He has type 2 diabetes mellitus. Onset time: He was diagnosed at approximate age of 24 years. His disease course has been worsening. There are no hypoglycemic associated symptoms. Pertinent negatives for hypoglycemia include no confusion, headaches, pallor or seizures. Associated symptoms include polydipsia and polyuria. Pertinent negatives for  diabetes include no chest pain, no fatigue, no polyphagia and no weakness. There are no hypoglycemic complications. Symptoms are worsening. Diabetic complications include nephropathy. Risk factors for coronary artery disease include diabetes mellitus, dyslipidemia, hypertension, male sex, sedentary lifestyle and tobacco exposure. Current diabetic treatment includes oral agent (dual therapy) (He is taking Glucovance and Januvia.). His weight is decreasing steadily. He is following a generally unhealthy diet. When asked about meal planning, he reported none. He has not had a previous visit with a dietitian. He never participates in exercise. Home blood sugar record trend: He did not bring the meter nor log to review today. He admits he does not monitor blood glucose regularly. An ACE inhibitor/angiotensin II receptor blocker is being taken. Eye exam is current.  Hyperlipidemia  This is a chronic problem. The current episode started more than 1 year ago. Exacerbating diseases include diabetes and obesity. Pertinent negatives include no chest pain, myalgias or shortness of breath. Current antihyperlipidemic treatment includes statins.  Hypertension  This is a chronic problem. The current episode started more than 1 year ago. The problem is uncontrolled. Pertinent negatives include no chest pain, headaches, neck pain, palpitations or shortness of breath. Past treatments include ACE inhibitors. Hypertensive end-organ damage includes kidney disease.       Review of Systems  Constitutional: Negative for chills, fatigue, fever and unexpected weight change.  HENT: Negative for dental problem, mouth sores and trouble swallowing.   Eyes: Negative for visual disturbance.  Respiratory: Negative for cough, choking, chest tightness, shortness of breath and wheezing.   Cardiovascular: Negative for chest pain, palpitations and leg swelling.  Gastrointestinal: Negative for abdominal distention, abdominal pain,  constipation, diarrhea, nausea and vomiting.  Endocrine: Positive for polydipsia and polyuria. Negative for polyphagia.  Genitourinary: Negative for dysuria, flank pain, hematuria and urgency.  Musculoskeletal: Negative for back pain, gait problem, myalgias and neck pain.  Skin: Negative for pallor, rash and wound.  Neurological: Negative for seizures, syncope, weakness, numbness and headaches.  Psychiatric/Behavioral: Negative.  Negative for confusion and dysphoric mood.    Objective:    BP (!) 143/81   Pulse 75   Ht '5\' 11"'  (1.803 m)   Wt 228 lb (103.4 kg)   BMI 31.80 kg/m   Wt Readings from Last 3 Encounters:  08/30/16 228 lb (103.4 kg)  09/30/15 236 lb 14.4 oz (107.5 kg)  09/03/14 235 lb 3.2 oz (106.7 kg)    Physical Exam  Constitutional: He is oriented to person, place, and time. He appears well-developed. He is cooperative. No distress.  HENT:  Head: Normocephalic and atraumatic.  Eyes: EOM are normal.  Neck: Normal range of motion. Neck supple. No tracheal deviation present. No thyromegaly present.  Cardiovascular: Normal rate, S1 normal, S2 normal and normal heart sounds.  Exam reveals no gallop.   No murmur heard. Pulses:      Dorsalis pedis pulses are 1+ on the right side, and 1+ on the left side.  Posterior tibial pulses are 1+ on the right side, and 1+ on the left side.  Pulmonary/Chest: Breath sounds normal. No respiratory distress. He has no wheezes.  Abdominal: Soft. Bowel sounds are normal. He exhibits no distension. There is no tenderness. There is no guarding and no CVA tenderness.  Musculoskeletal: He exhibits no edema.       Right shoulder: He exhibits no swelling and no deformity.  Neurological: He is alert and oriented to person, place, and time. He has normal strength and normal reflexes. No cranial nerve deficit or sensory deficit. Gait normal.  Skin: Skin is warm and dry. No rash noted. No cyanosis. Nails show no clubbing.  Psychiatric: He has a  normal mood and affect. His speech is normal. Judgment normal. Cognition and memory are normal.   CMP     Component Value Date/Time   NA 139 05/01/2009 0535   K 3.6 05/01/2009 0535   CL 109 05/01/2009 0535   CO2 24 05/01/2009 0535   GLUCOSE 129 (H) 05/01/2009 0535   BUN 12 05/01/2009 0535   CREATININE 0.87 05/01/2009 0535   CALCIUM 8.7 05/01/2009 0535   PROT 5.5 (L) 05/01/2009 0535   ALBUMIN 3.3 (L) 05/01/2009 0535   AST 19 05/01/2009 0535   ALT 19 05/01/2009 0535   ALKPHOS 34 (L) 05/01/2009 0535   BILITOT 0.5 05/01/2009 0535   GFRNONAA >60 05/01/2009 0535   GFRAA  05/01/2009 0535    >60        The eGFR has been calculated using the MDRD equation. This calculation has not been validated in all clinical situations. eGFR's persistently <60 mL/min signify possible Chronic Kidney Disease.     Diabetic Labs (most recent): Lab Results  Component Value Date   HGBA1C 8.9 06/25/2016   HGBA1C (H) 05/01/2009    8.0 (NOTE) The ADA recommends the following therapeutic goal for glycemic control related to Hgb A1c measurement: Goal of therapy: <6.5 Hgb A1c  Reference: American Diabetes Association: Clinical Practice Recommendations 2010, Diabetes Care, 2010, 33: (Suppl  1).      Assessment & Plan:   1. Uncontrolled type 2 diabetes mellitus with stage 3 chronic kidney disease, without long-term current use of insulin ([place])  - Patient has currently uncontrolled symptomatic type 2 DM since  60 years of age,  with most recent A1c of 8.9 %. Recent labs reviewed.   His diabetes is complicated by stage 3 CK D and patient remains at a high risk for more acute and chronic complications of diabetes which include CAD, CVA, CKD, retinopathy, and neuropathy. These are all discussed in detail with the patient.  - I have counseled the patient on diet management and weight loss, by adopting a carbohydrate restricted/protein rich diet.  - Suggestion is made for patient to avoid simple  carbohydrates   from their diet including Cakes , Desserts, Ice Cream,  Soda (  diet and regular) , Sweet Tea , Candies,  Chips, Cookies, Artificial Sweeteners,   and "Sugar-free" Products . This will help patient to have stable blood glucose profile and potentially avoid unintended weight gain.  - I encouraged the patient to switch to  unprocessed or minimally processed complex starch and increased protein intake (animal or plant source), fruits, and vegetables.  - Patient is advised to stick to a routine mealtimes to eat 3 meals  a day and avoid unnecessary snacks ( to snack only to correct hypoglycemia).  - The patient will be scheduled with Jearld Fenton, RDN, CDE  for individualized DM education.  - I have approached patient with the following individualized plan to manage diabetes and patient agrees:   -  Denies the fact that he has uncontrolled diabetes. He will likely require insulin therapy, after assuring his commitment for monitoring blood glucose. - I approached him to start  strict monitoring of glucose  AC and HS. - He will return in 1 week with his meter and logs for reevaluation.   -Patient is encouraged to call clinic for blood glucose levels less than 70 or above 300 mg /dl.  - I will continue Januvia 100 mg by mouth daily, likely would lower the dose on subsequent visits.  - I will discontinue Glucovance, risk outweighs benefit for this patient. -Patient is not a candidate for metformin, SGLt2 i due to CKD.  - Patient will be considered for incretin therapy as appropriate next visit. - Patient specific target  A1c;  LDL, HDL, Triglycerides, and  Waist Circumference were discussed in detail.  2) BP/HTN: uncontrolled. Continue current medications including ACEI/ARB. 3) Lipids/HPL:  Control unknown,  continue statins. 4)  Weight/Diet: CDE Consult will be initiated , exercise, and detailed carbohydrates information provided.  5) Chronic Care/Health Maintenance:  -Patient  is on ACEI/ARB and Statin medications and encouraged to continue to follow up with Ophthalmology, Podiatrist at least yearly or according to recommendations, and advised to   stay away from smoking. I have recommended yearly flu vaccine and pneumonia vaccination at least every 5 years; moderate intensity exercise for up to 150 minutes weekly; and  sleep for at least 7 hours a day.  - 60 minutes of time was spent on the care of this patient , 50% of which was applied for counseling on diabetes complications and their preventions.  - Patient to bring meter and  blood glucose logs during their next visit.   - I advised patient to maintain close follow up with Purvis Kilts, MD for primary care needs.  Follow up plan: - Return in about 1 week (around 09/06/2016) for follow up with meter and logs- no labs.  Glade Lloyd, MD Phone: [phone]  Fax: [phone]   08/30/2016, 2:08 PM

## 2016-08-30 NOTE — Patient Instructions (Signed)

## 2016-09-10 ENCOUNTER — Ambulatory Visit (INDEPENDENT_AMBULATORY_CARE_PROVIDER_SITE_OTHER): Payer: 59 | Admitting: "Endocrinology

## 2016-09-10 ENCOUNTER — Encounter: Payer: Self-pay | Admitting: "Endocrinology

## 2016-09-10 ENCOUNTER — Encounter: Payer: 59 | Attending: "Endocrinology | Admitting: Nutrition

## 2016-09-10 VITALS — Ht 71.0 in | Wt 227.0 lb

## 2016-09-10 VITALS — BP 136/81 | HR 78 | Ht 71.0 in | Wt 227.0 lb

## 2016-09-10 DIAGNOSIS — E118 Type 2 diabetes mellitus with unspecified complications: Secondary | ICD-10-CM

## 2016-09-10 DIAGNOSIS — E1122 Type 2 diabetes mellitus with diabetic chronic kidney disease: Secondary | ICD-10-CM | POA: Insufficient documentation

## 2016-09-10 DIAGNOSIS — E1165 Type 2 diabetes mellitus with hyperglycemia: Secondary | ICD-10-CM | POA: Diagnosis not present

## 2016-09-10 DIAGNOSIS — E782 Mixed hyperlipidemia: Secondary | ICD-10-CM

## 2016-09-10 DIAGNOSIS — N183 Chronic kidney disease, stage 3 (moderate): Secondary | ICD-10-CM | POA: Insufficient documentation

## 2016-09-10 DIAGNOSIS — Z794 Long term (current) use of insulin: Secondary | ICD-10-CM

## 2016-09-10 DIAGNOSIS — Z6831 Body mass index (BMI) 31.0-31.9, adult: Secondary | ICD-10-CM | POA: Diagnosis not present

## 2016-09-10 DIAGNOSIS — I10 Essential (primary) hypertension: Secondary | ICD-10-CM

## 2016-09-10 DIAGNOSIS — IMO0002 Reserved for concepts with insufficient information to code with codable children: Secondary | ICD-10-CM

## 2016-09-10 DIAGNOSIS — Z713 Dietary counseling and surveillance: Secondary | ICD-10-CM | POA: Insufficient documentation

## 2016-09-10 MED ORDER — INSULIN GLARGINE 300 UNIT/ML ~~LOC~~ SOPN: 20.0000 [IU] | PEN_INJECTOR | Freq: Every day | SUBCUTANEOUS | 2 refills | Status: DC

## 2016-09-10 MED ORDER — METFORMIN HCL 500 MG PO TABS: 500.0000 mg | ORAL_TABLET | Freq: Two times a day (BID) | ORAL | 3 refills | Status: DC

## 2016-09-10 MED ORDER — INSULIN PEN NEEDLE 31G X 8 MM MISC: 1.0000 | 3 refills | Status: DC

## 2016-09-10 MED ORDER — GLUCOSE BLOOD VI STRP: ORAL_STRIP | 3 refills | Status: DC

## 2016-09-10 MED ORDER — SITAGLIPTIN PHOSPHATE 50 MG PO TABS: 100.0000 mg | ORAL_TABLET | Freq: Every day | ORAL | 3 refills | Status: DC

## 2016-09-10 NOTE — Patient Instructions (Signed)
Goals 1. Follow My Plate 2. Eat meals on time 3. Drink only water 4.Increase low carb vegetables. 5. Take 20 units of Toujeo daily at night 6. Test blood sugars twice a day; before breakfast and before bedtime and record on sheets and bring to all appointments with meter.

## 2016-09-10 NOTE — Progress Notes (Signed)
  Medical Nutrition Therapy:  Appt start time: 1200 end time:  1230.   Assessment:  Primary concerns today: Diabetes Type 2. Here to see Dr. Dorris Fetch. Walk in. Will address safety questions next visit. Toujeo started today 20 units. Metformin 500 mg BID. Januvia 50 mg per day.  BS 350's. ON disability due to back problems.  Lab Results  Component Value Date   HGBA1C 8.9 06/25/2016    Preferred Learning Style:   Auditory  Visual  Hands on  No preference indicated    Learning Readiness:    Not ready  Contemplating  Ready  Change in progress   MEDICATIONS: See list  DIETARY INTAKE:  Eats 2-3 meals per day.  Usual physical activity: Walks daily  Estimated energy needs:  1800-200  calories 200 g carbohydrates 135 g protein 50 g fat  Progress Towards Goal(s):  In progress.   Nutritional Diagnosis:  NB-1.1 Food and nutrition-related knowledge deficit As related to Diabetes.  As evidenced by A1C 8.9%..    Intervention:  MY Plate, Carb counting, meal plan, taking insulin correctly. Eating three meals per day on time.  Goals 1. Follow My Plate 2. Eat meals on time 3. Drink only water 4.Increase low carb vegetables. 5. Take 20 units of Toujeo daily at night 6. Test blood sugars twice a day; before breakfast and before bedtime and record on sheets and bring to all appointments with meter. \ Teaching Method Utilized:  Visual Auditory Hands on  Handouts given during visit include:  The Plate Method  Meal Plan Card   Barriers to learning/adherence to lifestyle change:  None  Demonstrated degree of understanding via:  Teach Back   Monitoring/Evaluation:  Dietary intake, exercise, meal planning, SBG, and body weight in 2 week(s).

## 2016-09-10 NOTE — Progress Notes (Signed)
Subjective:    Patient ID: Dylan Spears, male    DOB: 1956-01-31. Patient is being seen in consultation for management of diabetes requested by  Purvis Kilts, MD  Past Medical History:  Diagnosis Date  . Chronic back pain   . CML (chronic myelocytic leukemia) ([place])   . Diabetes mellitus without complication ([place])   . History of stress test    myoview study suggested the possibility of basal inferior wall ischemia which was present in all 3 tonographic views.  [name] Hx of echocardiogram 05/2012   EF 55%Mildly hypertrophic left ventricle with normal systolic function and early signs of mild diastolic dysfuntion. Moderately dilated left atrium, mild aortic valve sclerosis with out stenosis.  . Hyperlipemia   . Hypertension   . Leukemia [facility])    Past Surgical History:  Procedure Laterality Date  . BACK SURGERY  12/2006  . CARDIAC CATHETERIZATION  2010   revealed mild nonobstructive CAD  . CARDIAC CATHETERIZATION  07/2012   This showed essentially normal coronary arteries without obstruction. there was normal LV function.  [name] KNEE SURGERY  01/1994  . LEFT HEART CATHETERIZATION WITH CORONARY ANGIOGRAM N/A 07/29/2012   Procedure: LEFT HEART CATHETERIZATION WITH CORONARY ANGIOGRAM;  Surgeon: Troy Sine, MD;  Location: [facility] CATH LAB;  Service: Cardiovascular;  Laterality: N/A;   Social History   Social History  . Marital status: Married    Spouse name: N/A  . Number of children: N/A  . Years of education: N/A   Social History Main Topics  . Smoking status: Never Smoker  . Smokeless tobacco: Never Used  . Alcohol use Yes  . Drug use: No  . Sexual activity: Not Asked   Other Topics Concern  . None   Social History Narrative  . None   Outpatient Encounter Prescriptions as of 09/10/2016  Medication Sig  . amLODipine (NORVASC) 5 MG tablet Take 5 mg by mouth daily.  [name] aspirin 81 MG tablet Take 81 mg by mouth daily.  [name] atorvastatin (LIPITOR) 20 MG tablet Take 20 mg by  mouth at bedtime.   [name] BYSTOLIC 10 MG tablet TAKE ONE TABLET BY MOUTH EVERY DAY.  [name] gabapentin (NEURONTIN) 300 MG capsule Take 300 mg by mouth 2 (two) times daily.   [name] imatinib (GLEEVEC) 400 MG tablet Take 400 mg by mouth daily. Take with meals and large glass of water.Caution:Chemotherapy.  . Insulin Glargine (TOUJEO SOLOSTAR) 300 UNIT/ML SOPN Inject 20 Units into the skin at bedtime.  . Insulin Pen Needle (B-D ULTRAFINE III SHORT PEN) 31G X 8 MM MISC 1 each by Does not apply route as directed.  . metFORMIN (GLUCOPHAGE) 500 MG tablet Take 1 tablet (500 mg total) by mouth 2 (two) times daily with a meal.  . Multiple Vitamin (MULTIVITAMIN WITH MINERALS) TABS Take 1 tablet by mouth daily.  . ramipril (ALTACE) 10 MG capsule Take 5 mg by mouth daily.   . sitaGLIPtin (JANUVIA) 50 MG tablet Take 2 tablets (100 mg total) by mouth daily.  . [DISCONTINUED] sitaGLIPtin (JANUVIA) 100 MG tablet Take 100 mg by mouth daily.   No facility-administered encounter medications on file as of 09/10/2016.    ALLERGIES: Allergies  Allergen Reactions  . Tylenol [Acetaminophen] Other (See Comments)    "when taking gleevec, do not take tylenol"   VACCINATION STATUS:  There is no immunization history on file for this patient.  Diabetes  He presents for his follow-up diabetic visit. He has type 2 diabetes mellitus.  Onset time: He was diagnosed at approximate age of 75 years. His disease course has been worsening. There are no hypoglycemic associated symptoms. Pertinent negatives for hypoglycemia include no confusion, headaches, pallor or seizures. Associated symptoms include polydipsia and polyuria. Pertinent negatives for diabetes include no chest pain, no fatigue, no polyphagia and no weakness. There are no hypoglycemic complications. Symptoms are worsening. Diabetic complications include nephropathy. Risk factors for coronary artery disease include diabetes mellitus, dyslipidemia, hypertension, male sex, sedentary  lifestyle and tobacco exposure. Current diabetic treatment includes oral agent (dual therapy) (He is taking Glucovance and Januvia.). His weight is stable. He is following a generally unhealthy diet. When asked about meal planning, he reported none. He has not had a previous visit with a dietitian. He never participates in exercise. His breakfast blood glucose range is generally >200 mg/dl. His lunch blood glucose range is generally >200 mg/dl. His dinner blood glucose range is generally >200 mg/dl. His overall blood glucose range is >200 mg/dl. (His average blood glucose for the last week is 347 (number equals 25).) An ACE inhibitor/angiotensin II receptor blocker is being taken. Eye exam is current.  Hyperlipidemia  This is a chronic problem. The current episode started more than 1 year ago. Exacerbating diseases include diabetes and obesity. Pertinent negatives include no chest pain, myalgias or shortness of breath. Current antihyperlipidemic treatment includes statins.  Hypertension  This is a chronic problem. The current episode started more than 1 year ago. The problem is uncontrolled. Pertinent negatives include no chest pain, headaches, neck pain, palpitations or shortness of breath. Past treatments include ACE inhibitors. Hypertensive end-organ damage includes kidney disease.       Review of Systems  Constitutional: Negative for chills, fatigue, fever and unexpected weight change.  HENT: Negative for dental problem, mouth sores and trouble swallowing.   Eyes: Negative for visual disturbance.  Respiratory: Negative for cough, choking, chest tightness, shortness of breath and wheezing.   Cardiovascular: Negative for chest pain, palpitations and leg swelling.  Gastrointestinal: Negative for abdominal distention, abdominal pain, constipation, diarrhea, nausea and vomiting.  Endocrine: Positive for polydipsia and polyuria. Negative for polyphagia.  Genitourinary: Negative for dysuria, flank  pain, hematuria and urgency.  Musculoskeletal: Negative for back pain, gait problem, myalgias and neck pain.  Skin: Negative for pallor, rash and wound.  Neurological: Negative for seizures, syncope, weakness, numbness and headaches.  Psychiatric/Behavioral: Negative.  Negative for confusion and dysphoric mood.    Objective:    BP 136/81   Pulse 78   Ht '5\' 11"'  (1.803 m)   Wt 227 lb (103 kg)   BMI 31.66 kg/m   Wt Readings from Last 3 Encounters:  09/10/16 227 lb (103 kg)  08/30/16 228 lb (103.4 kg)  09/30/15 236 lb 14.4 oz (107.5 kg)    Physical Exam  Constitutional: He is oriented to person, place, and time. He appears well-developed. He is cooperative. No distress.  HENT:  Head: Normocephalic and atraumatic.  Eyes: EOM are normal.  Neck: Normal range of motion. Neck supple. No tracheal deviation present. No thyromegaly present.  Cardiovascular: Normal rate, S1 normal, S2 normal and normal heart sounds.  Exam reveals no gallop.   No murmur heard. Pulses:      Dorsalis pedis pulses are 1+ on the right side, and 1+ on the left side.       Posterior tibial pulses are 1+ on the right side, and 1+ on the left side.  Pulmonary/Chest: Breath sounds normal. No respiratory distress. He has  no wheezes.  Abdominal: Soft. Bowel sounds are normal. He exhibits no distension. There is no tenderness. There is no guarding and no CVA tenderness.  Musculoskeletal: He exhibits no edema.       Right shoulder: He exhibits no swelling and no deformity.  Neurological: He is alert and oriented to person, place, and time. He has normal strength and normal reflexes. No cranial nerve deficit or sensory deficit. Gait normal.  Skin: Skin is warm and dry. No rash noted. No cyanosis. Nails show no clubbing.  Psychiatric: He has a normal mood and affect. His speech is normal. Judgment normal. Cognition and memory are normal.   CMP     Component Value Date/Time   NA 139 05/01/2009 0535   K 3.6 05/01/2009  0535   CL 109 05/01/2009 0535   CO2 24 05/01/2009 0535   GLUCOSE 129 (H) 05/01/2009 0535   BUN 12 05/01/2009 0535   CREATININE 0.87 05/01/2009 0535   CALCIUM 8.7 05/01/2009 0535   PROT 5.5 (L) 05/01/2009 0535   ALBUMIN 3.3 (L) 05/01/2009 0535   AST 19 05/01/2009 0535   ALT 19 05/01/2009 0535   ALKPHOS 34 (L) 05/01/2009 0535   BILITOT 0.5 05/01/2009 0535   GFRNONAA >60 05/01/2009 0535   GFRAA  05/01/2009 0535    >60        The eGFR has been calculated using the MDRD equation. This calculation has not been validated in all clinical situations. eGFR's persistently <60 mL/min signify possible Chronic Kidney Disease.     Diabetic Labs (most recent): Lab Results  Component Value Date   HGBA1C 8.9 06/25/2016   HGBA1C (H) 05/01/2009    8.0 (NOTE) The ADA recommends the following therapeutic goal for glycemic control related to Hgb A1c measurement: Goal of therapy: <6.5 Hgb A1c  Reference: American Diabetes Association: Clinical Practice Recommendations 2010, Diabetes Care, 2010, 33: (Suppl  1).    His labs from 07/19/2016 showing renal function within normal limits.  Assessment & Plan:   1. Uncontrolled type 2 diabetes mellitus with Improving chronic kidney disease.  - Patient has currently uncontrolled symptomatic type 2 DM since  60 years of age,  with most recent A1c of 8.9 %. Recent labs reviewed.   His diabetes is complicated by stage 3 CK D and patient remains at a high risk for more acute and chronic complications of diabetes which include CAD, CVA, CKD, retinopathy, and neuropathy. These are all discussed in detail with the patient.  - I have counseled the patient on diet management and weight loss, by adopting a carbohydrate restricted/protein rich diet.  - Suggestion is made for patient to avoid simple carbohydrates   from their diet including Cakes , Desserts, Ice Cream,  Soda (  diet and regular) , Sweet Tea , Candies,  Chips, Cookies, Artificial Sweeteners,   and  "Sugar-free" Products . This will help patient to have stable blood glucose profile and potentially avoid unintended weight gain.  - I encouraged the patient to switch to  unprocessed or minimally processed complex starch and increased protein intake (animal or plant source), fruits, and vegetables.  - Patient is advised to stick to a routine mealtimes to eat 3 meals  a day and avoid unnecessary snacks ( to snack only to correct hypoglycemia).  - The patient will be scheduled with Jearld Fenton, RDN, CDE for individualized DM education.  - I have approached patient with the following individualized plan to manage diabetes and patient agrees:   -  He came with severe hyperglycemia averaging 347 for the last 7 days. -  I approached him for basal insulin and he agrees. I will start Toujeo 20 units daily at bedtime associated with monitoring of blood glucose before meals and at bedtime.  -Patient is encouraged to call clinic for blood glucose levels less than 70 or above 300 mg /dl.  - I will lower  Januvia to 50 mg by mouth daily. - Based on his reasonably renal function, he would benefit from low-dose metformin. I will initiate 500 mg of metformin by mouth twice a day.  -Patient is not a candidate fo SGLt2 i due to   recent CKD which is improving.  - Patient specific target  A1c;  LDL, HDL, Triglycerides, and  Waist Circumference were discussed in detail.  2) BP/HTN: uncontrolled. Continue current medications including ACEI/ARB. 3) Lipids/HPL:  Control unknown,  continue statins. 4)  Weight/Diet: CDE Consult will be initiated , exercise, and detailed carbohydrates information provided.  5) Chronic Care/Health Maintenance:  -Patient is on ACEI/ARB and Statin medications and encouraged to continue to follow up with Ophthalmology, Podiatrist at least yearly or according to recommendations, and advised to   stay away from smoking. I have recommended yearly flu vaccine and pneumonia vaccination  at least every 5 years; moderate intensity exercise for up to 150 minutes weekly; and  sleep for at least 7 hours a day.  - 30 minutes of time was spent on the care of this patient , 50% of which was applied for counseling on diabetes complications and their preventions.  - Patient to bring meter and  blood glucose logs during their next visit.   - I advised patient to maintain close follow up with Purvis Kilts, MD for primary care needs.  Follow up plan: - Return in about 2 weeks (around 09/24/2016) for follow up with meter and logs- no labs.  Glade Lloyd, MD Phone: [phone]  Fax: [phone]   09/10/2016, 12:00 PM

## 2016-09-10 NOTE — Patient Instructions (Signed)

## 2016-09-11 ENCOUNTER — Other Ambulatory Visit: Payer: Self-pay

## 2016-09-11 MED ORDER — INSULIN DEGLUDEC 100 UNIT/ML ~~LOC~~ SOPN: 20.0000 [IU] | PEN_INJECTOR | Freq: Every day | SUBCUTANEOUS | 2 refills | Status: DC

## 2016-09-11 NOTE — Telephone Encounter (Signed)
Pt notified that Dylan Spears was sent in to replace Dylan Spears. Insurance would not cover [facility].

## 2016-09-24 ENCOUNTER — Encounter: Payer: Self-pay | Admitting: "Endocrinology

## 2016-09-24 ENCOUNTER — Ambulatory Visit (INDEPENDENT_AMBULATORY_CARE_PROVIDER_SITE_OTHER): Payer: 59 | Admitting: "Endocrinology

## 2016-09-24 ENCOUNTER — Encounter: Payer: 59 | Admitting: Nutrition

## 2016-09-24 ENCOUNTER — Encounter: Payer: Self-pay | Admitting: Nutrition

## 2016-09-24 VITALS — BP 141/90 | HR 82 | Ht 71.0 in | Wt 228.0 lb

## 2016-09-24 VITALS — Ht 73.0 in | Wt 229.0 lb

## 2016-09-24 DIAGNOSIS — E782 Mixed hyperlipidemia: Secondary | ICD-10-CM | POA: Diagnosis not present

## 2016-09-24 DIAGNOSIS — E1122 Type 2 diabetes mellitus with diabetic chronic kidney disease: Secondary | ICD-10-CM | POA: Diagnosis not present

## 2016-09-24 DIAGNOSIS — E1165 Type 2 diabetes mellitus with hyperglycemia: Secondary | ICD-10-CM | POA: Diagnosis not present

## 2016-09-24 DIAGNOSIS — IMO0002 Reserved for concepts with insufficient information to code with codable children: Secondary | ICD-10-CM

## 2016-09-24 DIAGNOSIS — E118 Type 2 diabetes mellitus with unspecified complications: Principal | ICD-10-CM

## 2016-09-24 DIAGNOSIS — I10 Essential (primary) hypertension: Secondary | ICD-10-CM | POA: Diagnosis not present

## 2016-09-24 DIAGNOSIS — N183 Chronic kidney disease, stage 3 (moderate): Secondary | ICD-10-CM | POA: Diagnosis not present

## 2016-09-24 DIAGNOSIS — E669 Obesity, unspecified: Secondary | ICD-10-CM

## 2016-09-24 DIAGNOSIS — Z794 Long term (current) use of insulin: Principal | ICD-10-CM

## 2016-09-24 MED ORDER — SITAGLIPTIN PHOSPHATE 50 MG PO TABS: 50.0000 mg | ORAL_TABLET | Freq: Every day | ORAL | 3 refills | Status: DC

## 2016-09-24 NOTE — Progress Notes (Signed)
  Medical Nutrition Therapy:  Appt start time: 1200 end time:  1230.   Assessment:  Primary concerns today: Diabetes Type 2.  Working on cutting out sweets. Drinking water. Watching portions sizes. 20 units Toujeo 20 units.. 500 mg Metformin BID and Januvia 50 mg once a day. Here with his wife.  FBS 150's in am. Eating better. Feels better.Limited with exercise due to back issues. No low blood sugars. Tolerating insulin well. Increasing low carb vegetables. Needs more carbs at breakfast.   Lab Results  Component Value Date   HGBA1C 8.9 06/25/2016    Preferred Learning Style:   Auditory  Visual  Hands on  No preference indicated    Learning Readiness:    Not ready  Contemplating  Ready  Change in progress   MEDICATIONS: See list  DIETARY INTAKE:  B 2 eggs, sausage. Coffee 1 slice toast whole wheat L) Hamburger and FF and water D) Cabbage, pintos and chicken, water Peanuts.   Usual physical activity: Walks daily  Estimated energy needs:  1800-200  calories 200 g carbohydrates 135 g protein 50 g fat  Progress Towards Goal(s):  In progress.   Nutritional Diagnosis:  NB-1.1 Food and nutrition-related knowledge deficit As related to Diabetes.  As evidenced by A1C 8.9%..    Intervention:  MY Plate, Carb counting, meal plan, taking insulin correctly. Eating three meals per day on time.  \Goals 1. Add 3 carb choices per meal Watch portion sizes Exercise 15 mintues twice a day every other day Get A1C down to 7% Increase low carb vegetables Lose 1 lb per week Teaching Method Utilized:  Visual Auditory Hands on  Handouts given during visit include:  The Plate Method  Meal Plan Card   Barriers to learning/adherence to lifestyle change:  None  Demonstrated degree of understanding via:  Teach Back   Monitoring/Evaluation:  Dietary intake, exercise, meal planning, SBG, and body weight in 2 week(s).

## 2016-09-24 NOTE — Patient Instructions (Signed)

## 2016-09-24 NOTE — Progress Notes (Signed)
Subjective:    Patient ID: Dylan Spears, male    DOB: 1956-02-01. Patient is being seen in consultation for management of diabetes requested by  Purvis Kilts, MD  Past Medical History:  Diagnosis Date  . Chronic back pain   . CML (chronic myelocytic leukemia) ([place])   . Diabetes mellitus without complication ([place])   . History of stress test    myoview study suggested the possibility of basal inferior wall ischemia which was present in all 3 tonographic views.  [name] Hx of echocardiogram 05/2012   EF 55%Mildly hypertrophic left ventricle with normal systolic function and early signs of mild diastolic dysfuntion. Moderately dilated left atrium, mild aortic valve sclerosis with out stenosis.  . Hyperlipemia   . Hypertension   . Leukemia [facility])    Past Surgical History:  Procedure Laterality Date  . BACK SURGERY  12/2006  . CARDIAC CATHETERIZATION  2010   revealed mild nonobstructive CAD  . CARDIAC CATHETERIZATION  07/2012   This showed essentially normal coronary arteries without obstruction. there was normal LV function.  [name] KNEE SURGERY  01/1994  . LEFT HEART CATHETERIZATION WITH CORONARY ANGIOGRAM N/A 07/29/2012   Procedure: LEFT HEART CATHETERIZATION WITH CORONARY ANGIOGRAM;  Surgeon: Troy Sine, MD;  Location: [facility] CATH LAB;  Service: Cardiovascular;  Laterality: N/A;   Social History   Social History  . Marital status: Married    Spouse name: N/A  . Number of children: N/A  . Years of education: N/A   Social History Main Topics  . Smoking status: Never Smoker  . Smokeless tobacco: Never Used  . Alcohol use Yes  . Drug use: No  . Sexual activity: Not Asked   Other Topics Concern  . None   Social History Narrative  . None   Outpatient Encounter Prescriptions as of 09/24/2016  Medication Sig  . amLODipine (NORVASC) 5 MG tablet Take 5 mg by mouth daily.  [name] aspirin 81 MG tablet Take 81 mg by mouth daily.  [name] atorvastatin (LIPITOR) 20 MG tablet Take 20 mg by  mouth at bedtime.   [name] BYSTOLIC 10 MG tablet TAKE ONE TABLET BY MOUTH EVERY DAY.  [name] glucose blood (ONE TOUCH TEST STRIPS) test strip Use as instructed  . imatinib (GLEEVEC) 400 MG tablet Take 400 mg by mouth daily. Take with meals and large glass of water.Caution:Chemotherapy.  . insulin degludec (TRESIBA FLEXTOUCH) 100 UNIT/ML SOPN FlexTouch Pen Inject 0.2 mLs (20 Units total) into the skin at bedtime.  . Insulin Pen Needle (B-D ULTRAFINE III SHORT PEN) 31G X 8 MM MISC 1 each by Does not apply route as directed.  . metFORMIN (GLUCOPHAGE) 500 MG tablet Take 1 tablet (500 mg total) by mouth 2 (two) times daily with a meal.  . Multiple Vitamin (MULTIVITAMIN WITH MINERALS) TABS Take 1 tablet by mouth daily.  . ramipril (ALTACE) 10 MG capsule Take 5 mg by mouth daily.   . sitaGLIPtin (JANUVIA) 50 MG tablet Take 1 tablet (50 mg total) by mouth daily.  . [DISCONTINUED] gabapentin (NEURONTIN) 300 MG capsule Take 300 mg by mouth 2 (two) times daily.   . [DISCONTINUED] Insulin Glargine (TOUJEO SOLOSTAR) 300 UNIT/ML SOPN Inject 20 Units into the skin at bedtime.  . [DISCONTINUED] sitaGLIPtin (JANUVIA) 50 MG tablet Take 2 tablets (100 mg total) by mouth daily. (Patient taking differently: Take 50 mg by mouth daily. )   No facility-administered encounter medications on file as of 09/24/2016.    ALLERGIES: Allergies  Allergen Reactions  . Tylenol [Acetaminophen] Other (See Comments)    "when taking gleevec, do not take tylenol"   VACCINATION STATUS:  There is no immunization history on file for this patient.  Diabetes  He presents for his follow-up diabetic visit. He has type 2 diabetes mellitus. Onset time: He was diagnosed at approximate age of 89 years. His disease course has been improving. There are no hypoglycemic associated symptoms. Pertinent negatives for hypoglycemia include no confusion, headaches, pallor or seizures. Associated symptoms include polydipsia and polyuria. Pertinent negatives  for diabetes include no chest pain, no fatigue, no polyphagia and no weakness. There are no hypoglycemic complications. Symptoms are improving. Diabetic complications include nephropathy. Risk factors for coronary artery disease include diabetes mellitus, dyslipidemia, hypertension, male sex, sedentary lifestyle and tobacco exposure. Current diabetic treatment includes oral agent (dual therapy) and insulin injections. His weight is stable. He is following a generally unhealthy diet. When asked about meal planning, he reported none. He has not had a previous visit with a dietitian. He never participates in exercise. His breakfast blood glucose range is generally 180-200 mg/dl. His lunch blood glucose range is generally 180-200 mg/dl. His dinner blood glucose range is generally 180-200 mg/dl. His overall blood glucose range is 180-200 mg/dl. (His average blood glucose for the last week is 347 (number equals 25).) An ACE inhibitor/angiotensin II receptor blocker is being taken. Eye exam is current.  Hyperlipidemia  This is a chronic problem. The current episode started more than 1 year ago. Exacerbating diseases include diabetes and obesity. Pertinent negatives include no chest pain, myalgias or shortness of breath. Current antihyperlipidemic treatment includes statins.  Hypertension  This is a chronic problem. The current episode started more than 1 year ago. The problem is uncontrolled. Pertinent negatives include no chest pain, headaches, neck pain, palpitations or shortness of breath. Past treatments include ACE inhibitors. Hypertensive end-organ damage includes kidney disease.     Review of Systems  Constitutional: Negative for chills, fatigue, fever and unexpected weight change.  HENT: Negative for dental problem, mouth sores and trouble swallowing.   Eyes: Negative for visual disturbance.  Respiratory: Negative for cough, choking, chest tightness, shortness of breath and wheezing.   Cardiovascular:  Negative for chest pain, palpitations and leg swelling.  Gastrointestinal: Negative for abdominal distention, abdominal pain, constipation, diarrhea, nausea and vomiting.  Endocrine: Positive for polydipsia and polyuria. Negative for polyphagia.  Genitourinary: Negative for dysuria, flank pain, hematuria and urgency.  Musculoskeletal: Negative for back pain, gait problem, myalgias and neck pain.  Skin: Negative for pallor, rash and wound.  Neurological: Negative for seizures, syncope, weakness, numbness and headaches.  Psychiatric/Behavioral: Negative.  Negative for confusion and dysphoric mood.    Objective:    BP (!) 141/90   Pulse 82   Ht '5\' 11"'  (1.803 m)   Wt 228 lb (103.4 kg)   BMI 31.80 kg/m   Wt Readings from Last 3 Encounters:  09/24/16 228 lb (103.4 kg)  09/10/16 227 lb (103 kg)  09/10/16 227 lb (103 kg)    Physical Exam  Constitutional: He is oriented to person, place, and time. He appears well-developed. He is cooperative. No distress.  HENT:  Head: Normocephalic and atraumatic.  Eyes: EOM are normal.  Neck: Normal range of motion. Neck supple. No tracheal deviation present. No thyromegaly present.  Cardiovascular: Normal rate, S1 normal, S2 normal and normal heart sounds.  Exam reveals no gallop.   No murmur heard. Pulses:      Dorsalis pedis  pulses are 1+ on the right side, and 1+ on the left side.       Posterior tibial pulses are 1+ on the right side, and 1+ on the left side.  Pulmonary/Chest: Breath sounds normal. No respiratory distress. He has no wheezes.  Abdominal: Soft. Bowel sounds are normal. He exhibits no distension. There is no tenderness. There is no guarding and no CVA tenderness.  Musculoskeletal: He exhibits no edema.       Right shoulder: He exhibits no swelling and no deformity.  Neurological: He is alert and oriented to person, place, and time. He has normal strength and normal reflexes. No cranial nerve deficit or sensory deficit. Gait normal.   Skin: Skin is warm and dry. No rash noted. No cyanosis. Nails show no clubbing.  Psychiatric: He has a normal mood and affect. His speech is normal. Judgment normal. Cognition and memory are normal.   CMP     Component Value Date/Time   NA 139 05/01/2009 0535   K 3.6 05/01/2009 0535   CL 109 05/01/2009 0535   CO2 24 05/01/2009 0535   GLUCOSE 129 (H) 05/01/2009 0535   BUN 12 05/01/2009 0535   CREATININE 0.87 05/01/2009 0535   CALCIUM 8.7 05/01/2009 0535   PROT 5.5 (L) 05/01/2009 0535   ALBUMIN 3.3 (L) 05/01/2009 0535   AST 19 05/01/2009 0535   ALT 19 05/01/2009 0535   ALKPHOS 34 (L) 05/01/2009 0535   BILITOT 0.5 05/01/2009 0535   GFRNONAA >60 05/01/2009 0535   GFRAA  05/01/2009 0535    >60        The eGFR has been calculated using the MDRD equation. This calculation has not been validated in all clinical situations. eGFR's persistently <60 mL/min signify possible Chronic Kidney Disease.     Diabetic Labs (most recent): Lab Results  Component Value Date   HGBA1C 8.9 06/25/2016   HGBA1C (H) 05/01/2009    8.0 (NOTE) The ADA recommends the following therapeutic goal for glycemic control related to Hgb A1c measurement: Goal of therapy: <6.5 Hgb A1c  Reference: American Diabetes Association: Clinical Practice Recommendations 2010, Diabetes Care, 2010, 33: (Suppl  1).    His labs from 07/19/2016 showing renal function within normal limits.  Assessment & Plan:   1. Uncontrolled type 2 diabetes mellitus with Improving chronic kidney disease.  - Patient has currently uncontrolled symptomatic type 2 DM since  60 years of age,  with most recent A1c of 8.9 %. Recent labs reviewed.   His diabetes is complicated by stage 3 CK D and patient remains at a high risk for more acute and chronic complications of diabetes which include CAD, CVA, CKD, retinopathy, and neuropathy. These are all discussed in detail with the patient.  - I have counseled the patient on diet management and  weight loss, by adopting a carbohydrate restricted/protein rich diet.  - Suggestion is made for patient to avoid simple carbohydrates   from their diet including Cakes , Desserts, Ice Cream,  Soda (  diet and regular) , Sweet Tea , Candies,  Chips, Cookies, Artificial Sweeteners,   and "Sugar-free" Products . This will help patient to have stable blood glucose profile and potentially avoid unintended weight gain.  - I encouraged the patient to switch to  unprocessed or minimally processed complex starch and increased protein intake (animal or plant source), fruits, and vegetables.  - Patient is advised to stick to a routine mealtimes to eat 3 meals  a day and avoid unnecessary  snacks ( to snack only to correct hypoglycemia).  - The patient will be scheduled with Jearld Fenton, RDN, CDE for individualized DM education.  - I have approached patient with the following individualized plan to manage diabetes and patient agrees:   -  He came with significant improvement in his glycemia averaging 196 over the last 7 days compared to 347 during his last visit.   -  I approached him  to stay on basal insulin and he agrees. -  I will continue  Tresiba 20 units daily at bedtime associated with monitoring of blood glucose before breakfast.  -Patient is encouraged to call clinic for blood glucose levels less than 70 or above 300 mg /dl.  - I will lower  Januvia to 50 mg by mouth daily. - Based on his reasonably renal function, he would benefit from low-dose metformin. I will continue 500 mg of metformin by mouth twice a day.  -Patient is not a candidate fo SGLt2 i due to   recent CKD which is improving.  - Patient specific target  A1c;  LDL, HDL, Triglycerides, and  Waist Circumference were discussed in detail.  2) BP/HTN: uncontrolled. Continue current medications including ACEI/ARB. 3) Lipids/HPL:  Control unknown,  continue statins. 4)  Weight/Diet: CDE Consult will be initiated , exercise, and  detailed carbohydrates information provided.  5) Chronic Care/Health Maintenance:  -Patient is on ACEI/ARB and Statin medications and encouraged to continue to follow up with Ophthalmology, Podiatrist at least yearly or according to recommendations, and advised to   stay away from smoking. I have recommended yearly flu vaccine and pneumonia vaccination at least every 5 years; moderate intensity exercise for up to 150 minutes weekly; and  sleep for at least 7 hours a day.  - 30 minutes of time was spent on the care of this patient , 50% of which was applied for counseling on diabetes complications and their preventions.  - Patient to bring meter and  blood glucose logs during their next visit.   - I advised patient to maintain close follow up with Purvis Kilts, MD for primary care needs.  Follow up plan: - Return in about 6 weeks (around 11/05/2016) for follow up with pre-visit labs, meter, and logs.  Glade Lloyd, MD Phone: [phone]  Fax: [phone]   09/24/2016, 3:51 PM

## 2016-09-24 NOTE — Patient Instructions (Signed)
Goals 1. Add 3 carb choices per meal Watch portion sizes Exercise 15 mintues twice a day every other day Get A1C down to 7% Increase low carb vegetables Lose 1 lb per week

## 2016-09-25 DIAGNOSIS — E1140 Type 2 diabetes mellitus with diabetic neuropathy, unspecified: Secondary | ICD-10-CM | POA: Diagnosis not present

## 2016-09-25 DIAGNOSIS — E782 Mixed hyperlipidemia: Secondary | ICD-10-CM | POA: Diagnosis not present

## 2016-09-25 DIAGNOSIS — I10 Essential (primary) hypertension: Secondary | ICD-10-CM | POA: Diagnosis not present

## 2016-09-25 DIAGNOSIS — Z6831 Body mass index (BMI) 31.0-31.9, adult: Secondary | ICD-10-CM | POA: Diagnosis not present

## 2016-09-25 DIAGNOSIS — Z23 Encounter for immunization: Secondary | ICD-10-CM | POA: Diagnosis not present

## 2016-09-25 DIAGNOSIS — Z1389 Encounter for screening for other disorder: Secondary | ICD-10-CM | POA: Diagnosis not present

## 2016-10-12 ENCOUNTER — Ambulatory Visit (INDEPENDENT_AMBULATORY_CARE_PROVIDER_SITE_OTHER): Payer: No Typology Code available for payment source | Admitting: Cardiovascular Disease

## 2016-10-12 ENCOUNTER — Encounter: Payer: Self-pay | Admitting: Cardiovascular Disease

## 2016-10-12 VITALS — BP 149/87 | HR 65 | Ht 73.0 in | Wt 230.8 lb

## 2016-10-12 DIAGNOSIS — E782 Mixed hyperlipidemia: Secondary | ICD-10-CM | POA: Diagnosis not present

## 2016-10-12 DIAGNOSIS — C9210 Chronic myeloid leukemia, BCR/ABL-positive, not having achieved remission: Secondary | ICD-10-CM

## 2016-10-12 DIAGNOSIS — I10 Essential (primary) hypertension: Secondary | ICD-10-CM

## 2016-10-12 DIAGNOSIS — N289 Disorder of kidney and ureter, unspecified: Secondary | ICD-10-CM

## 2016-10-12 MED ORDER — AMLODIPINE BESYLATE 10 MG PO TABS: 10.0000 mg | ORAL_TABLET | Freq: Every day | ORAL | 11 refills | Status: DC

## 2016-10-12 NOTE — Patient Instructions (Signed)
Your physician has recommended you make the following change in your medication:   1.) the amlodipine has been increased to 10 mg. A new prescription has been sent to your pharmacy.   Your physician wants you to follow-up in: 6 months or sooner if needed. You will receive a reminder letter in the mail two months in advance. If you don't receive a letter, please call our office to schedule the follow-up appointment.   If you need a refill on your cardiac medications before your next appointment, please call your pharmacy.

## 2016-10-13 NOTE — Progress Notes (Signed)
Patient ID: Dylan Spears, male   DOB: 1956/07/23, 60 y.o.   MRN: [id number]     PCP:  Dr. Hilma Favors   HPI: Dylan Spears, is a 60 y.o. male who presents to the office today for one-year cardiology evaluation.   Dylan Spears has a history of CML which was originally diagnosed in 2004, as well as a history of hypertension, hyperlipidemia, and type 2 diabetes mellitus. In 2010 he developed nitrate responsive chest pain which led to cardiac catheterization.  Catheterization revealed normal coronary arteries and normal LV function. In 2013 because of recurrent chest pain symptoms and echo Doppler study as well as Myoview scan were done. With exercise Myoview he exercised to a 9 minute workload. There was a question of borderline mild ischemia in the basal inferior wall. Ejection fraction is 54%. An echo Doppler study showed normal systolic and diastolic function. There was a question of pseudo-dyskinesis of the inferior wall due to increased infradiaphragmatic pressure.   He had mild aortic sclerosis.  Since I saw him one year ago he had significant difficulty with his low back.  He still has pain but this has improved.  He denies recent chest pressure. He denies palpitations.  At times he notes some mild shortness of breath with significant activity. He has a history of hypertension and has been on ramipril 10 mg, Bystolic 10 mg and amlodipine 5 mg.  He is on Burtrum for his CML.  He takes gabapentin for peripheral neuropathy.  He is a history of hyperlipidemia and is tolerated atorvastatin 20 mg.  He  has had issues with erectile dysfunction.  He has type 2 diabetes mellitus, on Januvia.  He had a blood work in August 2017.  Lab work on 07/11/2016 revealed renal insufficiency with a BUN of 25 and creatinine 1.62.  Hemoglobin A1c was 8.9.  Repeat blood work will on 07/19/2016 His glucose was 125.  BUN 14, creatinine 1.19.  He tells me that he also is seeing a nephrologist Dr Merita Norton in [place] and  additional labs were just recently redone, but these were not available for my review.  He presents for evaluation.  Past Medical History:  Diagnosis Date  . Chronic back pain   . CML (chronic myelocytic leukemia) ([place])   . Diabetes mellitus without complication ([place])   . History of stress test    myoview study suggested the possibility of basal inferior wall ischemia which was present in all 3 tonographic views.  [name] Hx of echocardiogram 05/2012   EF 55%Mildly hypertrophic left ventricle with normal systolic function and early signs of mild diastolic dysfuntion. Moderately dilated left atrium, mild aortic valve sclerosis with out stenosis.  . Hyperlipemia   . Hypertension   . Leukemia [facility])     Past Surgical History:  Procedure Laterality Date  . BACK SURGERY  12/2006  . CARDIAC CATHETERIZATION  2010   revealed mild nonobstructive CAD  . CARDIAC CATHETERIZATION  07/2012   This showed essentially normal coronary arteries without obstruction. there was normal LV function.  [name] KNEE SURGERY  01/1994  . LEFT HEART CATHETERIZATION WITH CORONARY ANGIOGRAM N/A 07/29/2012   Procedure: LEFT HEART CATHETERIZATION WITH CORONARY ANGIOGRAM;  Surgeon: Troy Sine, MD;  Location: [facility] CATH LAB;  Service: Cardiovascular;  Laterality: N/A;    Allergies  Allergen Reactions  . Tylenol [Acetaminophen] Other (See Comments)    "when taking gleevec, do not take tylenol"    Current Outpatient Prescriptions  Medication Sig Dispense Refill  .  aspirin 81 MG tablet Take 81 mg by mouth daily.    [name] atorvastatin (LIPITOR) 20 MG tablet Take 20 mg by mouth at bedtime.     [name] BYSTOLIC 10 MG tablet TAKE ONE TABLET BY MOUTH EVERY DAY. 30 tablet 10  . glucose blood (ONE TOUCH TEST STRIPS) test strip Use as instructed 150 each 3  . imatinib (GLEEVEC) 400 MG tablet Take 400 mg by mouth daily. Take with meals and large glass of water.Caution:Chemotherapy.    . insulin degludec (TRESIBA FLEXTOUCH) 100 UNIT/ML SOPN  FlexTouch Pen Inject 0.2 mLs (20 Units total) into the skin at bedtime. 15 mL 2  . Insulin Pen Needle (B-D ULTRAFINE III SHORT PEN) 31G X 8 MM MISC 1 each by Does not apply route as directed. 100 each 3  . metFORMIN (GLUCOPHAGE) 500 MG tablet Take 1 tablet (500 mg total) by mouth 2 (two) times daily with a meal. 180 tablet 3  . Multiple Vitamin (MULTIVITAMIN WITH MINERALS) TABS Take 1 tablet by mouth daily.    . Oxycodone HCl 10 MG TABS Take 10 mg by mouth as needed.     . ramipril (ALTACE) 10 MG capsule Take 5 mg by mouth daily.     . sitaGLIPtin (JANUVIA) 50 MG tablet Take 1 tablet (50 mg total) by mouth daily. 30 tablet 3  . amLODipine (NORVASC) 10 MG tablet Take 1 tablet (10 mg total) by mouth daily. 30 tablet 11   No current facility-administered medications for this visit.     Social History   Social History  . Marital status: Married    Spouse name: N/A  . Number of children: N/A  . Years of education: N/A   Occupational History  . Not on file.   Social History Main Topics  . Smoking status: Never Smoker  . Smokeless tobacco: Never Used  . Alcohol use Yes  . Drug use: No  . Sexual activity: Not on file   Other Topics Concern  . Not on file   Social History Narrative  . No narrative on file    Family History  Problem Relation Age of Onset  . Cancer Mother   . Cancer Father    Social history is notable in that he is married. He has 2 children. There is no tobacco use. He quit at age 58. He does not routinely exercise. He does note some fatigue. He rarely drinks alcohol.  ROS General: Negative; No fevers, chills, or night sweats;  HEENT: Negative; No changes in vision or hearing, sinus congestion, difficulty swallowing Pulmonary: Negative; No cough, wheezing, shortness of breath, hemoptysis Cardiovascular: Negative; No chest pain, presyncope, syncope, palpitations GI: Negative; No nausea, vomiting, diarrhea, or abdominal pain GU: Mild erectile  dysfunction Musculoskeletal: Negative; no myalgias, joint pain, or weakness Hematologic/Oncology: Positive for CML Endocrine: Positive for type 2 diabetes mellitus Neuro: Negative; no changes in balance, headaches Skin: Negative; No rashes or skin lesions Psychiatric: Negative; No behavioral problems, depression Sleep: Negative; No snoring, daytime sleepiness, hypersomnolence, bruxism, restless legs, hypnogognic hallucinations, no cataplexy Other comprehensive 14 point system review is negative.  PE BP (!) 149/87 (BP Location: Right Arm, Patient Position: Sitting, Cuff Size: Large)   Pulse 65   Ht '6\' 1"'  (1.854 m)   Wt 230 lb 12.8 oz (104.7 kg)   SpO2 99%   BMI 30.45 kg/m    Repeat blood pressure by me 158/90  Wt Readings from Last 3 Encounters:  10/12/16 230 lb 12.8 oz (104.7 kg)  09/24/16 229 lb (103.9 kg)  09/24/16 228 lb (103.4 kg)   General: Alert, oriented, no distress.  Skin: normal turgor, no rashes HEENT: Normocephalic, atraumatic. Pupils round and reactive; sclera anicteric;no lid lag.  Nose without nasal septal hypertrophy Mouth/Parynx benign; Mallinpatti scale 2 Neck: No JVD, no carotid bruits with normal carotid upstroke Lungs: clear to ausculatation and percussion; no wheezing or rales Chest wall: Nontender to palpation Heart: RRR, s1 s2 normal 1/6 systolic murmur; no S3 or S4 gallop.  No diastolic murmur.  No rubs, thrills or heaves. Abdomen: soft, nontender; no hepatosplenomehaly, BS+; abdominal aorta nontender and not dilated by palpation. Back: no CVA tenderness Pulses 2+ Extremities: no clubbing cyanosis or edema, Homan's sign negative  Neurologic: grossly nonfocal; cranial nerves grossly normal Psychological: Normal affect and mood  ECG (independently read by me): Normal sinus rhythm at 65 bpm.  LVH by voltage.  Normal intervals.  T-wave abnormality in lead 3.  ECG (independently read by me): Normal sinus rhythm at 66.  LVH by voltage  criteria.  October 2015 ECG (independently read by me): Sinus rhythm at 76 beats per minute.  LVH by voltage criteria in lead aVL  08/2013 ECG: Sinus rhythm. Moderate LVH by voltage criteria. Early repolarization in V2  LABS:  BMP Latest Ref Rng & Units 05/01/2009 04/30/2009 01/20/2008  Glucose 70 - 99 mg/dL 129(H) 202(H) 89  BUN 6 - 23 mg/dL '12 19 12  ' Creatinine 0.4 - 1.5 mg/dL 0.87 1.07 1.03  Sodium 135 - 145 mEq/L 139 137 136  Potassium 3.5 - 5.1 mEq/L 3.6 4.0 3.9  Chloride 96 - 112 mEq/L 109 103 104  CO2 19 - 32 mEq/L '24 23 29  ' Calcium 8.4 - 10.5 mg/dL 8.7 9.5 9.5   Hepatic Function Latest Ref Rng & Units 05/01/2009 01/20/2008  Total Protein 6.0 - 8.3 g/dL 5.5(L) 6.4  Albumin 3.5 - 5.2 g/dL 3.3(L) 3.9  AST 0 - 37 U/L 19 18  ALT 0 - 53 U/L 19 20  Alk Phosphatase 39 - 117 U/L 34(L) 44  Total Bilirubin 0.3 - 1.2 mg/dL 0.5 0.6   CBC Latest Ref Rng & Units 05/02/2009 05/01/2009 04/30/2009  WBC 4.0 - 10.5 K/uL 7.3 7.5 9.9  Hemoglobin 13.0 - 17.0 g/dL 13.3 12.7(L) 14.6  Hematocrit 39.0 - 52.0 % 39.1 36.8(L) 43.8  Platelets 150 - 400 K/uL 236 216 276   Lab Results  Component Value Date   MCV 92.1 05/02/2009   MCV 91.3 05/01/2009   MCV 92.0 04/30/2009            05/01/2009   Lab Results  Component Value Date   HGBA1C 8.9 06/25/2016  Lipid Panel  No results found for: CHOL, TRIG, HDL, CHOLHDL, VLDL, LDLCALC, LDLDIRECT   RADIOLOGY: No results found.    ASSESSMENT AND PLAN: Dylan Spears is a 60 year old African-American gentleman gentleman who has a history of hypertension with documented mild left ventricular hypertrophy on echocardiography and also by ECG criteria.  His blood pressure today is elevated  on his medical regimen consisting of ramipril, 10 mg, bystolic 10 mg and amlodipine 5 mg. he did recently seen his nephrologist and his blood pressure was also elevated.  I am recommending further titration of amlodipine to 10 mg daily.  His resting pulse is 65 and at present,  I will continue him on the current dose of Bystolic at 10 mg. He is  not having any chest pain.  He has a history of CML, and this  has been stable and he continues to be on Gleevec 400 mg daily.  He does have hyperlipidemia and is on atorvastatin 20 mg. his recent hemoglobin A1c was elevated and this may have been contributed by steroids to reduce the inflammation of his low back discomfort.  He is on Januvia, metformin, and insulin for his diabetes mellitus.  He continues to be on atorvastatin 20 mg for hyperlipidemia.  Target LDL is less than 70.  Try to obtain results of recent lipid studies.  He will continue to monitor his blood pressure and will be having renal follow-up evaluation.  His ECG remained stable.  I will see him in 6 months for reevaluation.    Time spent: 25 minutes  Troy Sine, MD, [facility]  10/13/2016 11:04 AM

## 2016-10-23 ENCOUNTER — Other Ambulatory Visit: Payer: Self-pay

## 2016-10-24 MED ORDER — RAMIPRIL 5 MG PO CAPS: 5.0000 mg | ORAL_CAPSULE | Freq: Every day | ORAL | 5 refills | Status: DC

## 2016-10-24 MED ORDER — NEBIVOLOL HCL 10 MG PO TABS
10.0000 mg | ORAL_TABLET | Freq: Every day | ORAL | 5 refills | Status: DC
Start: 2016-10-24 — End: 2017-04-16

## 2016-10-29 ENCOUNTER — Other Ambulatory Visit: Payer: Self-pay | Admitting: "Endocrinology

## 2016-10-29 DIAGNOSIS — E1165 Type 2 diabetes mellitus with hyperglycemia: Secondary | ICD-10-CM | POA: Diagnosis not present

## 2016-10-29 DIAGNOSIS — E1122 Type 2 diabetes mellitus with diabetic chronic kidney disease: Secondary | ICD-10-CM | POA: Diagnosis not present

## 2016-10-29 DIAGNOSIS — N183 Chronic kidney disease, stage 3 (moderate): Secondary | ICD-10-CM | POA: Diagnosis not present

## 2016-10-29 LAB — COMPREHENSIVE METABOLIC PANEL WITH GFR
ALT: 18 U/L (ref 9–46)
AST: 18 U/L (ref 10–35)
Albumin: 3.9 g/dL (ref 3.6–5.1)
Alkaline Phosphatase: 44 U/L (ref 40–115)
BUN: 18 mg/dL (ref 7–25)
CO2: 25 mmol/L (ref 20–31)
Calcium: 9.4 mg/dL (ref 8.6–10.3)
Chloride: 105 mmol/L (ref 98–110)
Creat: 1.39 mg/dL — ABNORMAL HIGH (ref 0.70–1.25)
Glucose, Bld: 126 mg/dL — ABNORMAL HIGH (ref 65–99)
Potassium: 4.8 mmol/L (ref 3.5–5.3)
Sodium: 139 mmol/L (ref 135–146)
Total Bilirubin: 0.3 mg/dL (ref 0.2–1.2)
Total Protein: 6.3 g/dL (ref 6.1–8.1)

## 2016-10-29 LAB — TSH: TSH: 1.5 m[IU]/L (ref 0.40–4.50)

## 2016-10-29 LAB — T4, FREE: Free T4: 1.1 ng/dL (ref 0.8–1.8)

## 2016-10-30 LAB — MICROALBUMIN / CREATININE URINE RATIO
Creatinine, Urine: 247 mg/dL (ref 20–370)
Microalb Creat Ratio: 101 mcg/mg creat — ABNORMAL HIGH (ref <30)
Microalb, Ur: 25 mg/dL

## 2016-10-30 LAB — HEMOGLOBIN A1C
Hgb A1c MFr Bld: 7.5 % — ABNORMAL HIGH (ref <5.7)
Mean Plasma Glucose: 169 mg/dL

## 2016-10-30 LAB — VITAMIN D 25 HYDROXY (VIT D DEFICIENCY, FRACTURES): Vit D, 25-Hydroxy: 42 ng/mL (ref 30–100)

## 2016-11-05 ENCOUNTER — Encounter: Payer: 59 | Attending: "Endocrinology | Admitting: Nutrition

## 2016-11-05 ENCOUNTER — Ambulatory Visit (INDEPENDENT_AMBULATORY_CARE_PROVIDER_SITE_OTHER): Payer: 59 | Admitting: "Endocrinology

## 2016-11-05 ENCOUNTER — Encounter: Payer: Self-pay | Admitting: "Endocrinology

## 2016-11-05 VITALS — Ht 73.0 in | Wt 228.0 lb

## 2016-11-05 VITALS — BP 142/88 | HR 72 | Ht 73.0 in | Wt 228.0 lb

## 2016-11-05 DIAGNOSIS — Z794 Long term (current) use of insulin: Secondary | ICD-10-CM

## 2016-11-05 DIAGNOSIS — E669 Obesity, unspecified: Secondary | ICD-10-CM

## 2016-11-05 DIAGNOSIS — E1122 Type 2 diabetes mellitus with diabetic chronic kidney disease: Secondary | ICD-10-CM

## 2016-11-05 DIAGNOSIS — IMO0002 Reserved for concepts with insufficient information to code with codable children: Secondary | ICD-10-CM

## 2016-11-05 DIAGNOSIS — E1165 Type 2 diabetes mellitus with hyperglycemia: Secondary | ICD-10-CM

## 2016-11-05 DIAGNOSIS — E782 Mixed hyperlipidemia: Secondary | ICD-10-CM | POA: Diagnosis not present

## 2016-11-05 DIAGNOSIS — N183 Chronic kidney disease, stage 3 (moderate): Secondary | ICD-10-CM | POA: Insufficient documentation

## 2016-11-05 DIAGNOSIS — I10 Essential (primary) hypertension: Secondary | ICD-10-CM | POA: Diagnosis not present

## 2016-11-05 DIAGNOSIS — Z713 Dietary counseling and surveillance: Secondary | ICD-10-CM | POA: Diagnosis not present

## 2016-11-05 DIAGNOSIS — Z6830 Body mass index (BMI) 30.0-30.9, adult: Secondary | ICD-10-CM | POA: Diagnosis not present

## 2016-11-05 DIAGNOSIS — E118 Type 2 diabetes mellitus with unspecified complications: Secondary | ICD-10-CM

## 2016-11-05 MED ORDER — INSULIN DEGLUDEC 100 UNIT/ML ~~LOC~~ SOPN: 30.0000 [IU] | PEN_INJECTOR | Freq: Every day | SUBCUTANEOUS | 2 refills | Status: DC

## 2016-11-05 NOTE — Patient Instructions (Addendum)
Goals [facility] the [employer] for water aerobics. Or walk on treadmilll Keep up the [place]! Lose 2-3 lbs per month

## 2016-11-05 NOTE — Progress Notes (Signed)
Subjective:    Patient ID: Dylan Spears, male    DOB: 07-Sep-1956. Patient is being seen in consultation for management of diabetes requested by  Purvis Kilts, MD  Past Medical History:  Diagnosis Date  . Chronic back pain   . CML (chronic myelocytic leukemia) ([place])   . Diabetes mellitus without complication ([place])   . History of stress test    myoview study suggested the possibility of basal inferior wall ischemia which was present in all 3 tonographic views.  [name] Hx of echocardiogram 05/2012   EF 55%Mildly hypertrophic left ventricle with normal systolic function and early signs of mild diastolic dysfuntion. Moderately dilated left atrium, mild aortic valve sclerosis with out stenosis.  . Hyperlipemia   . Hypertension   . Leukemia [facility])    Past Surgical History:  Procedure Laterality Date  . BACK SURGERY  12/2006  . CARDIAC CATHETERIZATION  2010   revealed mild nonobstructive CAD  . CARDIAC CATHETERIZATION  07/2012   This showed essentially normal coronary arteries without obstruction. there was normal LV function.  [name] KNEE SURGERY  01/1994  . LEFT HEART CATHETERIZATION WITH CORONARY ANGIOGRAM N/A 07/29/2012   Procedure: LEFT HEART CATHETERIZATION WITH CORONARY ANGIOGRAM;  Surgeon: Troy Sine, MD;  Location: [facility] CATH LAB;  Service: Cardiovascular;  Laterality: N/A;   Social History   Social History  . Marital status: Married    Spouse name: N/A  . Number of children: N/A  . Years of education: N/A   Social History Main Topics  . Smoking status: Never Smoker  . Smokeless tobacco: Never Used  . Alcohol use Yes  . Drug use: No  . Sexual activity: Not Asked   Other Topics Concern  . None   Social History Narrative  . None   Outpatient Encounter Prescriptions as of 11/05/2016  Medication Sig  . amLODipine (NORVASC) 10 MG tablet Take 1 tablet (10 mg total) by mouth daily.  [name] aspirin 81 MG tablet Take 81 mg by mouth daily.  [name] atorvastatin (LIPITOR) 20 MG  tablet Take 20 mg by mouth at bedtime.   [name] glucose blood (ONE TOUCH TEST STRIPS) test strip Use as instructed  . imatinib (GLEEVEC) 400 MG tablet Take 400 mg by mouth daily. Take with meals and large glass of water.Caution:Chemotherapy.  . insulin degludec (TRESIBA FLEXTOUCH) 100 UNIT/ML SOPN FlexTouch Pen Inject 0.3 mLs (30 Units total) into the skin at bedtime.  . Insulin Pen Needle (B-D ULTRAFINE III SHORT PEN) 31G X 8 MM MISC 1 each by Does not apply route as directed.  . metFORMIN (GLUCOPHAGE) 500 MG tablet Take 1 tablet (500 mg total) by mouth 2 (two) times daily with a meal.  . Multiple Vitamin (MULTIVITAMIN WITH MINERALS) TABS Take 1 tablet by mouth daily.  . nebivolol (BYSTOLIC) 10 MG tablet Take 1 tablet (10 mg total) by mouth daily.  . Oxycodone HCl 10 MG TABS Take 10 mg by mouth as needed.   . ramipril (ALTACE) 5 MG capsule Take 1 capsule (5 mg total) by mouth daily.  . [DISCONTINUED] insulin degludec (TRESIBA FLEXTOUCH) 100 UNIT/ML SOPN FlexTouch Pen Inject 0.2 mLs (20 Units total) into the skin at bedtime.  . [DISCONTINUED] sitaGLIPtin (JANUVIA) 50 MG tablet Take 1 tablet (50 mg total) by mouth daily.   No facility-administered encounter medications on file as of 11/05/2016.    ALLERGIES: Allergies  Allergen Reactions  . Tylenol [Acetaminophen] Other (See Comments)    "when taking gleevec, do  not take tylenol"   VACCINATION STATUS:  There is no immunization history on file for this patient.  Diabetes  He presents for his follow-up diabetic visit. He has type 2 diabetes mellitus. Onset time: He was diagnosed at approximate age of 59 years. His disease course has been improving. There are no hypoglycemic associated symptoms. Pertinent negatives for hypoglycemia include no confusion, headaches, pallor or seizures. Associated symptoms include polydipsia and polyuria. Pertinent negatives for diabetes include no chest pain, no fatigue, no polyphagia and no weakness. There are no  hypoglycemic complications. Symptoms are improving. Diabetic complications include nephropathy. Risk factors for coronary artery disease include diabetes mellitus, dyslipidemia, hypertension, male sex, sedentary lifestyle and tobacco exposure. Current diabetic treatment includes oral agent (dual therapy) and insulin injections. His weight is stable. He is following a generally unhealthy diet. When asked about meal planning, he reported none. He has not had a previous visit with a dietitian. He never participates in exercise. His breakfast blood glucose range is generally 140-180 mg/dl. His overall blood glucose range is 140-180 mg/dl. (His average blood glucose for the last week is 347 (number equals 25).) An ACE inhibitor/angiotensin II receptor blocker is being taken. Eye exam is current.  Hyperlipidemia  This is a chronic problem. The current episode started more than 1 year ago. Exacerbating diseases include diabetes and obesity. Pertinent negatives include no chest pain, myalgias or shortness of breath. Current antihyperlipidemic treatment includes statins.  Hypertension  This is a chronic problem. The current episode started more than 1 year ago. The problem is uncontrolled. Pertinent negatives include no chest pain, headaches, neck pain, palpitations or shortness of breath. Past treatments include ACE inhibitors. Hypertensive end-organ damage includes kidney disease.     Review of Systems  Constitutional: Negative for chills, fatigue, fever and unexpected weight change.  HENT: Negative for dental problem, mouth sores and trouble swallowing.   Eyes: Negative for visual disturbance.  Respiratory: Negative for cough, choking, chest tightness, shortness of breath and wheezing.   Cardiovascular: Negative for chest pain, palpitations and leg swelling.  Gastrointestinal: Negative for abdominal distention, abdominal pain, constipation, diarrhea, nausea and vomiting.  Endocrine: Positive for polydipsia  and polyuria. Negative for polyphagia.  Genitourinary: Negative for dysuria, flank pain, hematuria and urgency.  Musculoskeletal: Negative for back pain, gait problem, myalgias and neck pain.  Skin: Negative for pallor, rash and wound.  Neurological: Negative for seizures, syncope, weakness, numbness and headaches.  Psychiatric/Behavioral: Negative.  Negative for confusion and dysphoric mood.    Objective:    BP (!) 142/88   Pulse 72   Ht '6\' 1"'  (1.854 m)   Wt 228 lb (103.4 kg)   BMI 30.08 kg/m   Wt Readings from Last 3 Encounters:  11/05/16 228 lb (103.4 kg)  11/05/16 228 lb (103.4 kg)  10/12/16 230 lb 12.8 oz (104.7 kg)    Physical Exam  Constitutional: He is oriented to person, place, and time. He appears well-developed. He is cooperative. No distress.  HENT:  Head: Normocephalic and atraumatic.  Eyes: EOM are normal.  Neck: Normal range of motion. Neck supple. No tracheal deviation present. No thyromegaly present.  Cardiovascular: Normal rate, S1 normal, S2 normal and normal heart sounds.  Exam reveals no gallop.   No murmur heard. Pulses:      Dorsalis pedis pulses are 1+ on the right side, and 1+ on the left side.       Posterior tibial pulses are 1+ on the right side, and 1+ on the  left side.  Pulmonary/Chest: Breath sounds normal. No respiratory distress. He has no wheezes.  Abdominal: Soft. Bowel sounds are normal. He exhibits no distension. There is no tenderness. There is no guarding and no CVA tenderness.  Musculoskeletal: He exhibits no edema.       Right shoulder: He exhibits no swelling and no deformity.  Neurological: He is alert and oriented to person, place, and time. He has normal strength and normal reflexes. No cranial nerve deficit or sensory deficit. Gait normal.  Skin: Skin is warm and dry. No rash noted. No cyanosis. Nails show no clubbing.  Psychiatric: He has a normal mood and affect. His speech is normal. Judgment normal. Cognition and memory are  normal.   CMP     Component Value Date/Time   NA 139 10/29/2016 0831   K 4.8 10/29/2016 0831   CL 105 10/29/2016 0831   CO2 25 10/29/2016 0831   GLUCOSE 126 (H) 10/29/2016 0831   BUN 18 10/29/2016 0831   CREATININE 1.39 (H) 10/29/2016 0831   CALCIUM 9.4 10/29/2016 0831   PROT 6.3 10/29/2016 0831   ALBUMIN 3.9 10/29/2016 0831   AST 18 10/29/2016 0831   ALT 18 10/29/2016 0831   ALKPHOS 44 10/29/2016 0831   BILITOT 0.3 10/29/2016 0831   GFRNONAA >60 05/01/2009 0535   GFRAA  05/01/2009 0535    >60        The eGFR has been calculated using the MDRD equation. This calculation has not been validated in all clinical situations. eGFR's persistently <60 mL/min signify possible Chronic Kidney Disease.     Diabetic Labs (most recent): Lab Results  Component Value Date   HGBA1C 7.5 (H) 10/29/2016   HGBA1C 8.9 06/25/2016   HGBA1C (H) 05/01/2009    8.0 (NOTE) The ADA recommends the following therapeutic goal for glycemic control related to Hgb A1c measurement: Goal of therapy: <6.5 Hgb A1c  Reference: American Diabetes Association: Clinical Practice Recommendations 2010, Diabetes Care, 2010, 33: (Suppl  1).    Assessment & Plan:   1. Uncontrolled type 2 diabetes mellitus with Improving chronic kidney disease.  - Patient has currently uncontrolled symptomatic type 2 DM since  60 years of age. - He came with controlled fasting blood glucose average 140-150, A1c 7.5 improving from 8.9%. - Recent labs reviewed.   His diabetes is complicated by stage 3 CK D and patient remains at a high risk for more acute and chronic complications of diabetes which include CAD, CVA, CKD, retinopathy, and neuropathy. These are all discussed in detail with the patient.  - I have counseled the patient on diet management and weight loss, by adopting a carbohydrate restricted/protein rich diet.  - Suggestion is made for patient to avoid simple carbohydrates   from their diet including Cakes ,  Desserts, Ice Cream,  Soda (  diet and regular) , Sweet Tea , Candies,  Chips, Cookies, Artificial Sweeteners,   and "Sugar-free" Products . This will help patient to have stable blood glucose profile and potentially avoid unintended weight gain.  - I encouraged the patient to switch to  unprocessed or minimally processed complex starch and increased protein intake (animal or plant source), fruits, and vegetables.  - Patient is advised to stick to a routine mealtimes to eat 3 meals  a day and avoid unnecessary snacks ( to snack only to correct hypoglycemia).  - The patient will be scheduled with Jearld Fenton, RDN, CDE for individualized DM education.  - I have approached patient with  the following individualized plan to manage diabetes and patient agrees:   -  He came with significant improvement in his glycemia.  -  I approached him  to stay on basal insulin and he agrees. -  I will increase   Tresiba to 30 units daily at bedtime associated with monitoring of blood glucose before breakfast.  -Patient is encouraged to call clinic for blood glucose levels less than 70 or above 200 mg /dl.  - I will discontinue Januvia after he finishes his current supply of 50 mg by mouth daily. - Based on his reasonably renal function, he will benefit from low-dose metformin. I will continue 500 mg of metformin by mouth twice a day.  -Patient is not a candidate fo SGLt2 i due to   recent CKD which is improving.  - Patient specific target  A1c;  LDL, HDL, Triglycerides, and  Waist Circumference were discussed in detail.  2) BP/HTN: uncontrolled. Continue current medications including ACEI/ARB. 3) Lipids/HPL:  Control unknown,  continue statins. 4)  Weight/Diet: CDE Consult will be initiated , exercise, and detailed carbohydrates information provided.  5) Chronic Care/Health Maintenance:  -Patient is on ACEI/ARB and Statin medications and encouraged to continue to follow up with Ophthalmology, Podiatrist  at least yearly or according to recommendations, and advised to   stay away from smoking. I have recommended yearly flu vaccine and pneumonia vaccination at least every 5 years; moderate intensity exercise for up to 150 minutes weekly; and  sleep for at least 7 hours a day.  - 30 minutes of time was spent on the care of this patient , 50% of which was applied for counseling on diabetes complications and their preventions.  - Patient to bring meter and  blood glucose logs during their next visit.   - I advised patient to maintain close follow up with Purvis Kilts, MD for primary care needs.  Follow up plan: - Return in about 3 months (around 02/03/2017) for follow up with pre-visit labs, meter, and logs.  Glade Lloyd, MD Phone: [phone]  Fax: [phone]   11/05/2016, 3:05 PM

## 2016-11-05 NOTE — Patient Instructions (Signed)

## 2016-11-05 NOTE — Progress Notes (Signed)
  Medical Nutrition Therapy:  Appt start time: 1200 end time:  1230.  Assessment:  Primary concerns today: Diabetes Type 2.  Working on cutting out sweets. Drinking water. Watching portions sizes. 20 units Toujeo 20 units.. 500 mg Metformin BID and Januvia 50 mg once a day.  Not having blurry vision anymore. Cut out sweets and sodas. Eating three balanced meals per day and adding more vegetables. Not snacking.  Drinking water. Limited physical activity due to back issues. Saw Dr. Dorris Fetch today and will discontinue Januvia and increasing Tresiba to 30 units a day.  Lab Results  Component Value Date   HGBA1C 7.5 (H) 10/29/2016    Preferred Learning Style:   Auditory  Visual  Hands on  No preference indicated    Learning Readiness:    Not ready  Contemplating  Ready  Change in progress   MEDICATIONS: See list  DIETARY INTAKE:  B Eggs and or cereal, coffee,  L)  Salad and meat, or hamburger with bun or chicken sandwich, or soup , unsweet tea or water D) Beef ribs, baked beans, slaw, unsweet tea  Usual physical activity: Walks daily  Estimated energy needs:  1800-200  calories 200 g carbohydrates 135 g protein 50 g fat  Progress Towards Goal(s):  In progress.   Nutritional Diagnosis:  NB-1.1 Food and nutrition-related knowledge deficit As related to Diabetes.  As evidenced by A1C 8.9%..    Intervention:  MY Plate, Carb counting, meal plan, taking insulin correctly. Eating three meals per day on time.   Goals Look into the [employer] for water aerobics. Or walk on treadmilll Keep up the [place]! Lose 2-3 lbs per month  Teaching Method Utilized:  Visual Auditory Hands on  Handouts given during visit include:  The Plate Method  Meal Plan Card   Barriers to learning/adherence to lifestyle change:  None  Demonstrated degree of understanding via:  Teach Back   Monitoring/Evaluation:  Dietary intake, exercise, meal planning, SBG, and body weight in 3 months  (s).

## 2016-11-28 DIAGNOSIS — I10 Essential (primary) hypertension: Secondary | ICD-10-CM | POA: Diagnosis not present

## 2016-11-28 DIAGNOSIS — Z79899 Other long term (current) drug therapy: Secondary | ICD-10-CM | POA: Diagnosis not present

## 2016-11-28 DIAGNOSIS — N183 Chronic kidney disease, stage 3 (moderate): Secondary | ICD-10-CM | POA: Diagnosis not present

## 2016-11-30 ENCOUNTER — Other Ambulatory Visit: Payer: Self-pay | Admitting: "Endocrinology

## 2016-12-13 DIAGNOSIS — C9211 Chronic myeloid leukemia, BCR/ABL-positive, in remission: Secondary | ICD-10-CM | POA: Diagnosis not present

## 2016-12-13 DIAGNOSIS — N189 Chronic kidney disease, unspecified: Secondary | ICD-10-CM | POA: Diagnosis not present

## 2016-12-13 DIAGNOSIS — Z79899 Other long term (current) drug therapy: Secondary | ICD-10-CM | POA: Diagnosis not present

## 2017-01-10 DIAGNOSIS — N183 Chronic kidney disease, stage 3 (moderate): Secondary | ICD-10-CM | POA: Diagnosis not present

## 2017-01-10 DIAGNOSIS — E559 Vitamin D deficiency, unspecified: Secondary | ICD-10-CM | POA: Diagnosis not present

## 2017-01-10 DIAGNOSIS — R809 Proteinuria, unspecified: Secondary | ICD-10-CM | POA: Diagnosis not present

## 2017-01-10 DIAGNOSIS — D509 Iron deficiency anemia, unspecified: Secondary | ICD-10-CM | POA: Diagnosis not present

## 2017-01-10 DIAGNOSIS — I10 Essential (primary) hypertension: Secondary | ICD-10-CM | POA: Diagnosis not present

## 2017-01-10 DIAGNOSIS — Z79899 Other long term (current) drug therapy: Secondary | ICD-10-CM | POA: Diagnosis not present

## 2017-01-15 DIAGNOSIS — N179 Acute kidney failure, unspecified: Secondary | ICD-10-CM | POA: Diagnosis not present

## 2017-01-15 DIAGNOSIS — D638 Anemia in other chronic diseases classified elsewhere: Secondary | ICD-10-CM | POA: Diagnosis not present

## 2017-01-15 DIAGNOSIS — N182 Chronic kidney disease, stage 2 (mild): Secondary | ICD-10-CM | POA: Diagnosis not present

## 2017-01-24 DIAGNOSIS — I10 Essential (primary) hypertension: Secondary | ICD-10-CM | POA: Diagnosis not present

## 2017-01-24 DIAGNOSIS — E782 Mixed hyperlipidemia: Secondary | ICD-10-CM | POA: Diagnosis not present

## 2017-01-24 DIAGNOSIS — E1165 Type 2 diabetes mellitus with hyperglycemia: Secondary | ICD-10-CM | POA: Diagnosis not present

## 2017-01-24 DIAGNOSIS — Z1389 Encounter for screening for other disorder: Secondary | ICD-10-CM | POA: Diagnosis not present

## 2017-01-24 DIAGNOSIS — G8929 Other chronic pain: Secondary | ICD-10-CM | POA: Diagnosis not present

## 2017-01-24 DIAGNOSIS — Z6832 Body mass index (BMI) 32.0-32.9, adult: Secondary | ICD-10-CM | POA: Diagnosis not present

## 2017-02-05 ENCOUNTER — Ambulatory Visit: Payer: No Typology Code available for payment source | Admitting: Nutrition

## 2017-02-05 ENCOUNTER — Ambulatory Visit: Payer: 59 | Admitting: "Endocrinology

## 2017-02-06 ENCOUNTER — Other Ambulatory Visit: Payer: Self-pay | Admitting: "Endocrinology

## 2017-02-06 LAB — COMPREHENSIVE METABOLIC PANEL
ALBUMIN: 3.8 g/dL (ref 3.6–5.1)
ALK PHOS: 52 U/L (ref 40–115)
ALT: 18 U/L (ref 9–46)
AST: 20 U/L (ref 10–35)
BILIRUBIN TOTAL: 0.3 mg/dL (ref 0.2–1.2)
BUN: 15 mg/dL (ref 7–25)
CALCIUM: 9.1 mg/dL (ref 8.6–10.3)
CO2: 26 mmol/L (ref 20–31)
Chloride: 105 mmol/L (ref 98–110)
Creat: 1.19 mg/dL (ref 0.70–1.25)
Glucose, Bld: 128 mg/dL — ABNORMAL HIGH (ref 65–99)
Potassium: 4.4 mmol/L (ref 3.5–5.3)
Sodium: 139 mmol/L (ref 135–146)
TOTAL PROTEIN: 6.2 g/dL (ref 6.1–8.1)

## 2017-02-07 LAB — HEMOGLOBIN A1C
HEMOGLOBIN A1C: 7.2 % — AB (ref <5.7)
MEAN PLASMA GLUCOSE: 160 mg/dL

## 2017-02-13 ENCOUNTER — Ambulatory Visit (INDEPENDENT_AMBULATORY_CARE_PROVIDER_SITE_OTHER): Payer: 59 | Admitting: "Endocrinology

## 2017-02-13 ENCOUNTER — Encounter: Payer: Self-pay | Admitting: "Endocrinology

## 2017-02-13 VITALS — BP 136/81 | HR 70 | Ht 73.0 in | Wt 237.0 lb

## 2017-02-13 DIAGNOSIS — E1165 Type 2 diabetes mellitus with hyperglycemia: Secondary | ICD-10-CM

## 2017-02-13 DIAGNOSIS — N183 Chronic kidney disease, stage 3 (moderate): Secondary | ICD-10-CM

## 2017-02-13 DIAGNOSIS — E1122 Type 2 diabetes mellitus with diabetic chronic kidney disease: Secondary | ICD-10-CM

## 2017-02-13 DIAGNOSIS — IMO0002 Reserved for concepts with insufficient information to code with codable children: Secondary | ICD-10-CM

## 2017-02-13 DIAGNOSIS — Z794 Long term (current) use of insulin: Secondary | ICD-10-CM

## 2017-02-13 DIAGNOSIS — E782 Mixed hyperlipidemia: Secondary | ICD-10-CM | POA: Diagnosis not present

## 2017-02-13 DIAGNOSIS — I10 Essential (primary) hypertension: Secondary | ICD-10-CM

## 2017-02-13 MED ORDER — METFORMIN HCL 850 MG PO TABS: 850.0000 mg | ORAL_TABLET | Freq: Two times a day (BID) | ORAL | 3 refills | Status: DC

## 2017-02-13 MED ORDER — INSULIN DEGLUDEC 100 UNIT/ML ~~LOC~~ SOPN: 20.0000 [IU] | PEN_INJECTOR | Freq: Every day | SUBCUTANEOUS | 2 refills | Status: DC

## 2017-02-13 NOTE — Patient Instructions (Signed)

## 2017-02-13 NOTE — Progress Notes (Signed)
Subjective:    Patient ID: Dylan Spears, male    DOB: Jan 23, 1956. Patient is being seen in f/u  for management of diabetes requested by  Purvis Kilts, MD  Past Medical History:  Diagnosis Date  . Chronic back pain   . CML (chronic myelocytic leukemia) ([place])   . Diabetes mellitus without complication ([place])   . History of stress test    myoview study suggested the possibility of basal inferior wall ischemia which was present in all 3 tonographic views.  [name] Hx of echocardiogram 05/2012   EF 55%Mildly hypertrophic left ventricle with normal systolic function and early signs of mild diastolic dysfuntion. Moderately dilated left atrium, mild aortic valve sclerosis with out stenosis.  . Hyperlipemia   . Hypertension   . Leukemia [facility])    Past Surgical History:  Procedure Laterality Date  . BACK SURGERY  12/2006  . CARDIAC CATHETERIZATION  2010   revealed mild nonobstructive CAD  . CARDIAC CATHETERIZATION  07/2012   This showed essentially normal coronary arteries without obstruction. there was normal LV function.  [name] KNEE SURGERY  01/1994  . LEFT HEART CATHETERIZATION WITH CORONARY ANGIOGRAM N/A 07/29/2012   Procedure: LEFT HEART CATHETERIZATION WITH CORONARY ANGIOGRAM;  Surgeon: Troy Sine, MD;  Location: [facility] CATH LAB;  Service: Cardiovascular;  Laterality: N/A;   Social History   Social History  . Marital status: Married    Spouse name: N/A  . Number of children: N/A  . Years of education: N/A   Social History Main Topics  . Smoking status: Never Smoker  . Smokeless tobacco: Never Used  . Alcohol use Yes  . Drug use: No  . Sexual activity: Not Asked   Other Topics Concern  . None   Social History Narrative  . None   Outpatient Encounter Prescriptions as of 02/13/2017  Medication Sig  . amLODipine (NORVASC) 10 MG tablet Take 1 tablet (10 mg total) by mouth daily.  [name] aspirin 81 MG tablet Take 81 mg by mouth daily.  [name] atorvastatin (LIPITOR) 20 MG tablet Take  20 mg by mouth at bedtime.   . B-D ULTRAFINE III SHORT PEN 31G X 8 MM MISC USING WITH TOUJEO  . glucose blood (ONE TOUCH TEST STRIPS) test strip Use as instructed  . imatinib (GLEEVEC) 400 MG tablet Take 400 mg by mouth daily. Take with meals and large glass of water.Caution:Chemotherapy.  . insulin degludec (TRESIBA FLEXTOUCH) 100 UNIT/ML SOPN FlexTouch Pen Inject 0.2 mLs (20 Units total) into the skin at bedtime.  . metFORMIN (GLUCOPHAGE) 850 MG tablet Take 1 tablet (850 mg total) by mouth 2 (two) times daily with a meal.  . Multiple Vitamin (MULTIVITAMIN WITH MINERALS) TABS Take 1 tablet by mouth daily.  . nebivolol (BYSTOLIC) 10 MG tablet Take 1 tablet (10 mg total) by mouth daily.  . Oxycodone HCl 10 MG TABS Take 10 mg by mouth as needed.   . ramipril (ALTACE) 5 MG capsule Take 1 capsule (5 mg total) by mouth daily.  . [DISCONTINUED] insulin degludec (TRESIBA FLEXTOUCH) 100 UNIT/ML SOPN FlexTouch Pen Inject 0.3 mLs (30 Units total) into the skin at bedtime.  . [DISCONTINUED] metFORMIN (GLUCOPHAGE) 500 MG tablet Take 1 tablet (500 mg total) by mouth 2 (two) times daily with a meal.   No facility-administered encounter medications on file as of 02/13/2017.    ALLERGIES: Allergies  Allergen Reactions  . Tylenol [Acetaminophen] Other (See Comments)    "when taking gleevec, do not take  tylenol"   VACCINATION STATUS:  There is no immunization history on file for this patient.  Diabetes  He presents for his follow-up diabetic visit. He has type 2 diabetes mellitus. Onset time: He was diagnosed at approximate age of 84 years. His disease course has been improving. There are no hypoglycemic associated symptoms. Pertinent negatives for hypoglycemia include no confusion, headaches, pallor or seizures. Associated symptoms include polydipsia and polyuria. Pertinent negatives for diabetes include no chest pain, no fatigue, no polyphagia and no weakness. There are no hypoglycemic complications.  Symptoms are improving. Diabetic complications include nephropathy. Risk factors for coronary artery disease include diabetes mellitus, dyslipidemia, hypertension, male sex, sedentary lifestyle and tobacco exposure. Current diabetic treatment includes oral agent (dual therapy) and insulin injections. His weight is stable. He is following a generally unhealthy diet. When asked about meal planning, he reported none. He has not had a previous visit with a dietitian. He never participates in exercise. His breakfast blood glucose range is generally 140-180 mg/dl. His overall blood glucose range is 140-180 mg/dl. (His average blood glucose for the last week is 347 (number equals 25).) An ACE inhibitor/angiotensin II receptor blocker is being taken. Eye exam is current.  Hyperlipidemia  This is a chronic problem. The current episode started more than 1 year ago. Exacerbating diseases include diabetes and obesity. Pertinent negatives include no chest pain, myalgias or shortness of breath. Current antihyperlipidemic treatment includes statins.  Hypertension  This is a chronic problem. The current episode started more than 1 year ago. The problem is uncontrolled. Pertinent negatives include no chest pain, headaches, neck pain, palpitations or shortness of breath. Past treatments include ACE inhibitors. Hypertensive end-organ damage includes kidney disease.     Review of Systems  Constitutional: Negative for chills, fatigue, fever and unexpected weight change.  HENT: Negative for dental problem, mouth sores and trouble swallowing.   Eyes: Negative for visual disturbance.  Respiratory: Negative for cough, choking, chest tightness, shortness of breath and wheezing.   Cardiovascular: Negative for chest pain, palpitations and leg swelling.  Gastrointestinal: Negative for abdominal distention, abdominal pain, constipation, diarrhea, nausea and vomiting.  Endocrine: Positive for polydipsia and polyuria. Negative for  polyphagia.  Genitourinary: Negative for dysuria, flank pain, hematuria and urgency.  Musculoskeletal: Negative for back pain, gait problem, myalgias and neck pain.  Skin: Negative for pallor, rash and wound.  Neurological: Negative for seizures, syncope, weakness, numbness and headaches.  Psychiatric/Behavioral: Negative.  Negative for confusion and dysphoric mood.    Objective:    BP 136/81   Pulse 70   Ht '6\' 1"'  (1.854 m)   Wt 237 lb (107.5 kg)   BMI 31.27 kg/m   Wt Readings from Last 3 Encounters:  02/13/17 237 lb (107.5 kg)  11/05/16 228 lb (103.4 kg)  11/05/16 228 lb (103.4 kg)    Physical Exam  Constitutional: He is oriented to person, place, and time. He appears well-developed. He is cooperative. No distress.  HENT:  Head: Normocephalic and atraumatic.  Eyes: EOM are normal.  Neck: Normal range of motion. Neck supple. No tracheal deviation present. No thyromegaly present.  Cardiovascular: Normal rate, S1 normal, S2 normal and normal heart sounds.  Exam reveals no gallop.   No murmur heard. Pulses:      Dorsalis pedis pulses are 1+ on the right side, and 1+ on the left side.       Posterior tibial pulses are 1+ on the right side, and 1+ on the left side.  Pulmonary/Chest: Breath  sounds normal. No respiratory distress. He has no wheezes.  Abdominal: Soft. Bowel sounds are normal. He exhibits no distension. There is no tenderness. There is no guarding and no CVA tenderness.  Musculoskeletal: He exhibits no edema.       Right shoulder: He exhibits no swelling and no deformity.  Neurological: He is alert and oriented to person, place, and time. He has normal strength and normal reflexes. No cranial nerve deficit or sensory deficit. Gait normal.  Skin: Skin is warm and dry. No rash noted. No cyanosis. Nails show no clubbing.  Psychiatric: He has a normal mood and affect. His speech is normal. Judgment normal. Cognition and memory are normal.   CMP     Component Value  Date/Time   NA 139 02/06/2017 0810   K 4.4 02/06/2017 0810   CL 105 02/06/2017 0810   CO2 26 02/06/2017 0810   GLUCOSE 128 (H) 02/06/2017 0810   BUN 15 02/06/2017 0810   CREATININE 1.19 02/06/2017 0810   CALCIUM 9.1 02/06/2017 0810   PROT 6.2 02/06/2017 0810   ALBUMIN 3.8 02/06/2017 0810   AST 20 02/06/2017 0810   ALT 18 02/06/2017 0810   ALKPHOS 52 02/06/2017 0810   BILITOT 0.3 02/06/2017 0810   GFRNONAA >60 05/01/2009 0535   GFRAA  05/01/2009 0535    >60        The eGFR has been calculated using the MDRD equation. This calculation has not been validated in all clinical situations. eGFR's persistently <60 mL/min signify possible Chronic Kidney Disease.     Diabetic Labs (most recent): Lab Results  Component Value Date   HGBA1C 7.2 (H) 02/06/2017   HGBA1C 7.5 (H) 10/29/2016   HGBA1C 8.9 06/25/2016    Assessment & Plan:   1. Uncontrolled type 2 diabetes mellitus with Improving chronic kidney disease.  - Patient has currently uncontrolled symptomatic type 2 DM since  61 years of age. - He came with controlled fasting blood glucose average 130-136, A1c 7.2%,  improving from 8.9%. - Recent labs reviewed.   His diabetes is complicated by stage 3 CK D and patient remains at a high risk for more acute and chronic complications of diabetes which include CAD, CVA, CKD, retinopathy, and neuropathy. These are all discussed in detail with the patient.  - I have counseled the patient on diet management and weight loss, by adopting a carbohydrate restricted/protein rich diet.  - Suggestion is made for patient to avoid simple carbohydrates   from their diet including Cakes , Desserts, Ice Cream,  Soda (  diet and regular) , Sweet Tea , Candies,  Chips, Cookies, Artificial Sweeteners,   and "Sugar-free" Products . This will help patient to have stable blood glucose profile and potentially avoid unintended weight gain.  - I encouraged the patient to switch to  unprocessed or  minimally processed complex starch and increased protein intake (animal or plant source), fruits, and vegetables.  - Patient is advised to stick to a routine mealtimes to eat 3 meals  a day and avoid unnecessary snacks ( to snack only to correct hypoglycemia).  - The patient will be scheduled with Jearld Fenton, RDN, CDE for individualized DM education.  - I have approached patient with the following individualized plan to manage diabetes and patient agrees:   -  He came with significant improvement in his glycemia.  -  I approached him  to stay on basal insulin and he agrees. -  I will decrease   Tresiba to  20 units daily at bedtime associated with monitoring of blood glucose before breakfast.  -Patient is encouraged to call clinic for blood glucose levels less than 70 or above 200 mg /dl.  - I will discontinue Januvia after he finishes his current supply of 50 mg by mouth daily. - Based on his improved  renal function, he will benefit from a higher dose of metformin. I will increase metformin to 850 mg by mouth twice a day.  -Patient is now a  candidate for SGLt2 i  if he loses control.   - Patient specific target  A1c;  LDL, HDL, Triglycerides, and  Waist Circumference were discussed in detail.  2) BP/HTN: uncontrolled. Continue current medications including ACEI/ARB. 3) Lipids/HPL:  Control unknown,  continue statins. 4)  Weight/Diet: CDE Consult will be initiated , exercise, and detailed carbohydrates information provided.  5) Chronic Care/Health Maintenance:  -Patient is on ACEI/ARB and Statin medications and encouraged to continue to follow up with Ophthalmology, Podiatrist at least yearly or according to recommendations, and advised to   stay away from smoking. I have recommended yearly flu vaccine and pneumonia vaccination at least every 5 years; moderate intensity exercise for up to 150 minutes weekly; and  sleep for at least 7 hours a day.  - 30 minutes of time was spent on  the care of this patient , 50% of which was applied for counseling on diabetes complications and their preventions.  - Patient to bring meter and  blood glucose logs during their next visit.   - I advised patient to maintain close follow up with Purvis Kilts, MD for primary care needs.  Follow up plan: - Return in about 3 months (around 05/16/2017) for follow up with pre-visit labs, meter, and logs.  Glade Lloyd, MD Phone: [phone]  Fax: [phone]   02/13/2017, 3:24 PM

## 2017-02-19 ENCOUNTER — Other Ambulatory Visit: Payer: Self-pay | Admitting: "Endocrinology

## 2017-04-03 DIAGNOSIS — Z79899 Other long term (current) drug therapy: Secondary | ICD-10-CM | POA: Diagnosis not present

## 2017-04-03 DIAGNOSIS — I10 Essential (primary) hypertension: Secondary | ICD-10-CM | POA: Diagnosis not present

## 2017-04-03 DIAGNOSIS — D509 Iron deficiency anemia, unspecified: Secondary | ICD-10-CM | POA: Diagnosis not present

## 2017-04-03 DIAGNOSIS — N183 Chronic kidney disease, stage 3 (moderate): Secondary | ICD-10-CM | POA: Diagnosis not present

## 2017-04-10 DIAGNOSIS — D649 Anemia, unspecified: Secondary | ICD-10-CM | POA: Diagnosis not present

## 2017-04-10 DIAGNOSIS — I10 Essential (primary) hypertension: Secondary | ICD-10-CM | POA: Diagnosis not present

## 2017-04-10 DIAGNOSIS — N182 Chronic kidney disease, stage 2 (mild): Secondary | ICD-10-CM | POA: Diagnosis not present

## 2017-04-16 ENCOUNTER — Other Ambulatory Visit: Payer: Self-pay | Admitting: Cardiovascular Disease

## 2017-04-16 NOTE — Telephone Encounter (Signed)
Rx request sent to pharmacy.  

## 2017-05-06 DIAGNOSIS — Z1389 Encounter for screening for other disorder: Secondary | ICD-10-CM | POA: Diagnosis not present

## 2017-05-06 DIAGNOSIS — Z6831 Body mass index (BMI) 31.0-31.9, adult: Secondary | ICD-10-CM | POA: Diagnosis not present

## 2017-05-06 DIAGNOSIS — E119 Type 2 diabetes mellitus without complications: Secondary | ICD-10-CM | POA: Diagnosis not present

## 2017-05-06 DIAGNOSIS — G8929 Other chronic pain: Secondary | ICD-10-CM | POA: Diagnosis not present

## 2017-05-06 DIAGNOSIS — E1140 Type 2 diabetes mellitus with diabetic neuropathy, unspecified: Secondary | ICD-10-CM | POA: Diagnosis not present

## 2017-05-06 DIAGNOSIS — E6609 Other obesity due to excess calories: Secondary | ICD-10-CM | POA: Diagnosis not present

## 2017-05-06 DIAGNOSIS — E782 Mixed hyperlipidemia: Secondary | ICD-10-CM | POA: Diagnosis not present

## 2017-05-27 ENCOUNTER — Other Ambulatory Visit: Payer: Self-pay | Admitting: "Endocrinology

## 2017-05-27 LAB — COMPREHENSIVE METABOLIC PANEL
ALK PHOS: 43 U/L (ref 40–115)
ALT: 17 U/L (ref 9–46)
AST: 17 U/L (ref 10–35)
Albumin: 4.1 g/dL (ref 3.6–5.1)
BUN: 15 mg/dL (ref 7–25)
CALCIUM: 9.2 mg/dL (ref 8.6–10.3)
CHLORIDE: 104 mmol/L (ref 98–110)
CO2: 26 mmol/L (ref 20–31)
Creat: 1.31 mg/dL — ABNORMAL HIGH (ref 0.70–1.25)
Glucose, Bld: 139 mg/dL — ABNORMAL HIGH (ref 65–99)
POTASSIUM: 4.7 mmol/L (ref 3.5–5.3)
Sodium: 137 mmol/L (ref 135–146)
TOTAL PROTEIN: 6.5 g/dL (ref 6.1–8.1)
Total Bilirubin: 0.4 mg/dL (ref 0.2–1.2)

## 2017-05-28 LAB — HEMOGLOBIN A1C
HEMOGLOBIN A1C: 7.5 % — AB (ref <5.7)
Mean Plasma Glucose: 169 mg/dL

## 2017-06-03 DIAGNOSIS — E119 Type 2 diabetes mellitus without complications: Secondary | ICD-10-CM | POA: Diagnosis not present

## 2017-06-03 DIAGNOSIS — Z794 Long term (current) use of insulin: Secondary | ICD-10-CM | POA: Diagnosis not present

## 2017-06-03 DIAGNOSIS — H2513 Age-related nuclear cataract, bilateral: Secondary | ICD-10-CM | POA: Diagnosis not present

## 2017-06-04 ENCOUNTER — Encounter: Payer: Self-pay | Admitting: "Endocrinology

## 2017-06-04 ENCOUNTER — Ambulatory Visit (INDEPENDENT_AMBULATORY_CARE_PROVIDER_SITE_OTHER): Payer: 59 | Admitting: "Endocrinology

## 2017-06-04 VITALS — BP 132/83 | HR 72 | Ht 73.0 in | Wt 236.0 lb

## 2017-06-04 DIAGNOSIS — Z794 Long term (current) use of insulin: Secondary | ICD-10-CM | POA: Diagnosis not present

## 2017-06-04 DIAGNOSIS — E1165 Type 2 diabetes mellitus with hyperglycemia: Secondary | ICD-10-CM

## 2017-06-04 DIAGNOSIS — E782 Mixed hyperlipidemia: Secondary | ICD-10-CM

## 2017-06-04 DIAGNOSIS — I10 Essential (primary) hypertension: Secondary | ICD-10-CM

## 2017-06-04 DIAGNOSIS — N183 Chronic kidney disease, stage 3 (moderate): Secondary | ICD-10-CM

## 2017-06-04 DIAGNOSIS — IMO0002 Reserved for concepts with insufficient information to code with codable children: Secondary | ICD-10-CM

## 2017-06-04 DIAGNOSIS — E1122 Type 2 diabetes mellitus with diabetic chronic kidney disease: Secondary | ICD-10-CM

## 2017-06-04 LAB — HM DIABETES EYE EXAM

## 2017-06-04 NOTE — Patient Instructions (Signed)

## 2017-06-04 NOTE — Progress Notes (Signed)
Subjective:    Patient ID: Dylan Spears, male    DOB: 1955/12/11. Patient is being seen in f/u  for management of diabetes requested by  Sharilyn Sites, MD  Past Medical History:  Diagnosis Date  . Chronic back pain   . CML (chronic myelocytic leukemia) ([place])   . Diabetes mellitus without complication ([place])   . History of stress test    myoview study suggested the possibility of basal inferior wall ischemia which was present in all 3 tonographic views.  [name] Hx of echocardiogram 05/2012   EF 55%Mildly hypertrophic left ventricle with normal systolic function and early signs of mild diastolic dysfuntion. Moderately dilated left atrium, mild aortic valve sclerosis with out stenosis.  . Hyperlipemia   . Hypertension   . Leukemia [facility])    Past Surgical History:  Procedure Laterality Date  . BACK SURGERY  12/2006  . CARDIAC CATHETERIZATION  2010   revealed mild nonobstructive CAD  . CARDIAC CATHETERIZATION  07/2012   This showed essentially normal coronary arteries without obstruction. there was normal LV function.  [name] KNEE SURGERY  01/1994  . LEFT HEART CATHETERIZATION WITH CORONARY ANGIOGRAM N/A 07/29/2012   Procedure: LEFT HEART CATHETERIZATION WITH CORONARY ANGIOGRAM;  Surgeon: Troy Sine, MD;  Location: [facility] CATH LAB;  Service: Cardiovascular;  Laterality: N/A;   Social History   Social History  . Marital status: Married    Spouse name: N/A  . Number of children: N/A  . Years of education: N/A   Social History Main Topics  . Smoking status: Never Smoker  . Smokeless tobacco: Never Used  . Alcohol use Yes  . Drug use: No  . Sexual activity: Not Asked   Other Topics Concern  . None   Social History Narrative  . None   Outpatient Encounter Prescriptions as of 06/04/2017  Medication Sig  . amLODipine (NORVASC) 10 MG tablet Take 1 tablet (10 mg total) by mouth daily.  [name] aspirin 81 MG tablet Take 81 mg by mouth daily.  [name] atorvastatin (LIPITOR) 20 MG tablet Take 20 mg  by mouth at bedtime.   . B-D ULTRAFINE III SHORT PEN 31G X 8 MM MISC USING WITH TOUJEO  . BYSTOLIC 10 MG tablet TAKE ONE TABLET BY MOUTH EVERY DAY.  [name] glucose blood (ONE TOUCH TEST STRIPS) test strip Use as instructed  . imatinib (GLEEVEC) 400 MG tablet Take 400 mg by mouth daily. Take with meals and large glass of water.Caution:Chemotherapy.  . metFORMIN (GLUCOPHAGE) 850 MG tablet Take 1 tablet (850 mg total) by mouth 2 (two) times daily with a meal.  . Multiple Vitamin (MULTIVITAMIN WITH MINERALS) TABS Take 1 tablet by mouth daily.  . Oxycodone HCl 10 MG TABS Take 10 mg by mouth as needed.   . ramipril (ALTACE) 5 MG capsule TAKE (1) CAPSULE BY MOUTH ONCE DAILY.  [name] TRESIBA FLEXTOUCH 100 UNIT/ML SOPN FlexTouch Pen INJECT 30 UNITS INTO THE SKIN AT BEDTIME.   No facility-administered encounter medications on file as of 06/04/2017.    ALLERGIES: Allergies  Allergen Reactions  . Tylenol [Acetaminophen] Other (See Comments)    "when taking gleevec, do not take tylenol"   VACCINATION STATUS:  There is no immunization history on file for this patient.  Diabetes  He presents for his follow-up diabetic visit. He has type 2 diabetes mellitus. Onset time: He was diagnosed at approximate age of 38 years. His disease course has been stable. There are no hypoglycemic associated symptoms. Pertinent negatives  for hypoglycemia include no confusion, headaches, pallor or seizures. Pertinent negatives for diabetes include no chest pain, no fatigue, no polydipsia, no polyphagia, no polyuria and no weakness. There are no hypoglycemic complications. Symptoms are stable. Diabetic complications include nephropathy. Risk factors for coronary artery disease include diabetes mellitus, dyslipidemia, hypertension, male sex, sedentary lifestyle and tobacco exposure. Current diabetic treatment includes oral agent (dual therapy) and insulin injections. His weight is stable. He is following a generally unhealthy diet. When  asked about meal planning, he reported none. He has not had a previous visit with a dietitian. He never participates in exercise. His breakfast blood glucose range is generally 140-180 mg/dl. His overall blood glucose range is 140-180 mg/dl. (His average blood glucose for the last week is 347 (number equals 25).) An ACE inhibitor/angiotensin II receptor blocker is being taken. Eye exam is current.  Hyperlipidemia  This is a chronic problem. The current episode started more than 1 year ago. Exacerbating diseases include diabetes and obesity. Pertinent negatives include no chest pain, myalgias or shortness of breath. Current antihyperlipidemic treatment includes statins.  Hypertension  This is a chronic problem. The current episode started more than 1 year ago. The problem is uncontrolled. Pertinent negatives include no chest pain, headaches, neck pain, palpitations or shortness of breath. Past treatments include ACE inhibitors. Hypertensive end-organ damage includes kidney disease.     Review of Systems  Constitutional: Negative for chills, fatigue, fever and unexpected weight change.  HENT: Negative for dental problem, mouth sores and trouble swallowing.   Eyes: Negative for visual disturbance.  Respiratory: Negative for cough, choking, chest tightness, shortness of breath and wheezing.   Cardiovascular: Negative for chest pain, palpitations and leg swelling.  Gastrointestinal: Negative for abdominal distention, abdominal pain, constipation, diarrhea, nausea and vomiting.  Endocrine: Negative for polydipsia, polyphagia and polyuria.  Genitourinary: Negative for dysuria, flank pain, hematuria and urgency.  Musculoskeletal: Negative for back pain, gait problem, myalgias and neck pain.  Skin: Negative for pallor, rash and wound.  Neurological: Negative for seizures, syncope, weakness, numbness and headaches.  Psychiatric/Behavioral: Negative.  Negative for confusion and dysphoric mood.     Objective:    BP 132/83   Pulse 72   Ht '6\' 1"'  (1.854 m)   Wt 236 lb (107 kg)   BMI 31.14 kg/m   Wt Readings from Last 3 Encounters:  06/04/17 236 lb (107 kg)  02/13/17 237 lb (107.5 kg)  11/05/16 228 lb (103.4 kg)    Physical Exam  Constitutional: He is oriented to person, place, and time. He appears well-developed. He is cooperative. No distress.  HENT:  Head: Normocephalic and atraumatic.  Eyes: EOM are normal.  Neck: Normal range of motion. Neck supple. No tracheal deviation present. No thyromegaly present.  Cardiovascular: Normal rate, S1 normal, S2 normal and normal heart sounds.  Exam reveals no gallop.   No murmur heard. Pulses:      Dorsalis pedis pulses are 1+ on the right side, and 1+ on the left side.       Posterior tibial pulses are 1+ on the right side, and 1+ on the left side.  Pulmonary/Chest: Breath sounds normal. No respiratory distress. He has no wheezes.  Abdominal: Soft. Bowel sounds are normal. He exhibits no distension. There is no tenderness. There is no guarding and no CVA tenderness.  Musculoskeletal: He exhibits no edema.       Right shoulder: He exhibits no swelling and no deformity.  Neurological: He is alert and oriented to  person, place, and time. He has normal strength and normal reflexes. No cranial nerve deficit or sensory deficit. Gait normal.  Skin: Skin is warm and dry. No rash noted. No cyanosis. Nails show no clubbing.  Psychiatric: He has a normal mood and affect. His speech is normal. Judgment normal. Cognition and memory are normal.   CMP     Component Value Date/Time   NA 137 05/27/2017 0823   K 4.7 05/27/2017 0823   CL 104 05/27/2017 0823   CO2 26 05/27/2017 0823   GLUCOSE 139 (H) 05/27/2017 0823   BUN 15 05/27/2017 0823   CREATININE 1.31 (H) 05/27/2017 0823   CALCIUM 9.2 05/27/2017 0823   PROT 6.5 05/27/2017 0823   ALBUMIN 4.1 05/27/2017 0823   AST 17 05/27/2017 0823   ALT 17 05/27/2017 0823   ALKPHOS 43 05/27/2017 0823    BILITOT 0.4 05/27/2017 0823   GFRNONAA >60 05/01/2009 0535   GFRAA  05/01/2009 0535    >60        The eGFR has been calculated using the MDRD equation. This calculation has not been validated in all clinical situations. eGFR's persistently <60 mL/min signify possible Chronic Kidney Disease.     Diabetic Labs (most recent): Lab Results  Component Value Date   HGBA1C 7.5 (H) 05/27/2017   HGBA1C 7.2 (H) 02/06/2017   HGBA1C 7.5 (H) 10/29/2016    Assessment & Plan:   1. Uncontrolled type 2 diabetes mellitus with Improving chronic kidney disease.  - Patient has currently uncontrolled symptomatic type 2 DM since  61 years of age. - He came with controlled fasting blood glucose average 130-136, A1c 7.5%, overall   improving from 8.9%. - Recent labs reviewed.   His diabetes is complicated by stage 2 CK D and patient remains at a high risk for more acute and chronic complications of diabetes which include CAD, CVA, CKD, retinopathy, and neuropathy. These are all discussed in detail with the patient.  - I have counseled the patient on diet management and weight loss, by adopting a carbohydrate restricted/protein rich diet.  - Suggestion is made for patient to avoid simple carbohydrates   from his  diet including Cakes , Desserts, Ice Cream,  Soda (  diet and regular) , Sweet Tea , Candies,  Chips, Cookies, Artificial Sweeteners,   and "Sugar-free" Products . This will help patient to have stable blood glucose profile and potentially avoid unintended weight gain.  - I encouraged the patient to switch to  unprocessed or minimally processed complex starch and increased protein intake (animal or plant source), fruits, and vegetables.  - Patient is advised to stick to a routine mealtimes to eat 3 meals  a day and avoid unnecessary snacks ( to snack only to correct hypoglycemia).  - The patient will be scheduled with Jearld Fenton, RDN, CDE for individualized DM education.  - I have  approached patient with the following individualized plan to manage diabetes and patient agrees:   -  I approached him  to stay on basal insulin and he agrees. -  I will increase  Tresiba to 30 units daily at bedtime associated with monitoring of blood glucose before breakfast.  -Patient is encouraged to call clinic for blood glucose levels less than 70 or above 200 mg /dl.  - Based on his improved  renal function, he will benefit from a higher dose of metformin. I will continue metformin  850 mg by mouth twice a day.   - Patient specific target  A1c;  LDL, HDL, Triglycerides, and  Waist Circumference were discussed in detail.  2) BP/HTN: uncontrolled. Continue current medications including ACEI/ARB. 3) Lipids/HPL:  Control unknown,  continue statins. 4)  Weight/Diet: CDE Consult will be initiated , exercise, and detailed carbohydrates information provided.  5) Chronic Care/Health Maintenance:  -Patient is on ACEI/ARB and Statin medications and encouraged to continue to follow up with Ophthalmology, Podiatrist at least yearly or according to recommendations, and advised to   stay away from smoking. I have recommended yearly flu vaccine and pneumonia vaccination at least every 5 years; moderate intensity exercise for up to 150 minutes weekly; and  sleep for at least 7 hours a day.  - 30 minutes of time was spent on the care of this patient , 50% of which was applied for counseling on diabetes complications and their preventions.  - Patient to bring meter and  blood glucose logs during his next visit.   - I advised patient to maintain close follow up with Sharilyn Sites, MD for primary care needs.  Follow up plan: - Return in about 4 months (around 10/05/2017) for meter, and logs.  Glade Lloyd, MD Phone: [phone]  Fax: [phone]   06/04/2017, 2:38 PM

## 2017-06-13 DIAGNOSIS — M62838 Other muscle spasm: Secondary | ICD-10-CM | POA: Diagnosis not present

## 2017-06-13 DIAGNOSIS — C9210 Chronic myeloid leukemia, BCR/ABL-positive, not having achieved remission: Secondary | ICD-10-CM | POA: Diagnosis not present

## 2017-06-13 DIAGNOSIS — N189 Chronic kidney disease, unspecified: Secondary | ICD-10-CM | POA: Diagnosis not present

## 2017-06-13 DIAGNOSIS — C9211 Chronic myeloid leukemia, BCR/ABL-positive, in remission: Secondary | ICD-10-CM | POA: Diagnosis not present

## 2017-06-18 ENCOUNTER — Other Ambulatory Visit: Payer: Self-pay | Admitting: "Endocrinology

## 2017-09-10 DIAGNOSIS — E782 Mixed hyperlipidemia: Secondary | ICD-10-CM | POA: Diagnosis not present

## 2017-09-10 DIAGNOSIS — C9210 Chronic myeloid leukemia, BCR/ABL-positive, not having achieved remission: Secondary | ICD-10-CM | POA: Diagnosis not present

## 2017-09-10 DIAGNOSIS — I10 Essential (primary) hypertension: Secondary | ICD-10-CM | POA: Diagnosis not present

## 2017-09-10 DIAGNOSIS — G8929 Other chronic pain: Secondary | ICD-10-CM | POA: Diagnosis not present

## 2017-09-10 DIAGNOSIS — E1140 Type 2 diabetes mellitus with diabetic neuropathy, unspecified: Secondary | ICD-10-CM | POA: Diagnosis not present

## 2017-09-10 DIAGNOSIS — C9111 Chronic lymphocytic leukemia of B-cell type in remission: Secondary | ICD-10-CM | POA: Diagnosis not present

## 2017-09-10 DIAGNOSIS — E119 Type 2 diabetes mellitus without complications: Secondary | ICD-10-CM | POA: Diagnosis not present

## 2017-09-14 ENCOUNTER — Other Ambulatory Visit: Payer: Self-pay | Admitting: Cardiovascular Disease

## 2017-09-14 ENCOUNTER — Other Ambulatory Visit: Payer: Self-pay | Admitting: "Endocrinology

## 2017-10-02 LAB — RENAL FUNCTION PANEL
Albumin: 3.9 g/dL (ref 3.6–5.1)
BUN / CREAT RATIO: 15 (calc) (ref 6–22)
BUN: 19 mg/dL (ref 7–25)
CO2: 25 mmol/L (ref 20–32)
CREATININE: 1.31 mg/dL — AB (ref 0.70–1.25)
Calcium: 9.5 mg/dL (ref 8.6–10.3)
Chloride: 105 mmol/L (ref 98–110)
GLUCOSE: 166 mg/dL — AB (ref 65–99)
PHOSPHORUS: 2.9 mg/dL (ref 2.5–4.5)
POTASSIUM: 4.6 mmol/L (ref 3.5–5.3)
SODIUM: 137 mmol/L (ref 135–146)

## 2017-10-02 LAB — HEMOGLOBIN A1C
EAG (MMOL/L): 8.5 (calc)
HEMOGLOBIN A1C: 7 %{Hb} — AB (ref <5.7)
MEAN PLASMA GLUCOSE: 154 (calc)

## 2017-10-07 ENCOUNTER — Ambulatory Visit (INDEPENDENT_AMBULATORY_CARE_PROVIDER_SITE_OTHER): Payer: 59 | Admitting: "Endocrinology

## 2017-10-07 ENCOUNTER — Encounter: Payer: Self-pay | Admitting: "Endocrinology

## 2017-10-07 VITALS — BP 135/84 | HR 68 | Ht 73.0 in | Wt 240.0 lb

## 2017-10-07 DIAGNOSIS — E782 Mixed hyperlipidemia: Secondary | ICD-10-CM | POA: Diagnosis not present

## 2017-10-07 DIAGNOSIS — Z794 Long term (current) use of insulin: Secondary | ICD-10-CM

## 2017-10-07 DIAGNOSIS — N183 Chronic kidney disease, stage 3 (moderate): Secondary | ICD-10-CM | POA: Diagnosis not present

## 2017-10-07 DIAGNOSIS — E1165 Type 2 diabetes mellitus with hyperglycemia: Secondary | ICD-10-CM | POA: Diagnosis not present

## 2017-10-07 DIAGNOSIS — I10 Essential (primary) hypertension: Secondary | ICD-10-CM | POA: Diagnosis not present

## 2017-10-07 DIAGNOSIS — E1122 Type 2 diabetes mellitus with diabetic chronic kidney disease: Secondary | ICD-10-CM

## 2017-10-07 DIAGNOSIS — IMO0002 Reserved for concepts with insufficient information to code with codable children: Secondary | ICD-10-CM

## 2017-10-07 NOTE — Patient Instructions (Signed)

## 2017-10-07 NOTE — Progress Notes (Signed)
Subjective:    Patient ID: Dylan Spears, male    DOB: [birth year]. Patient is being seen in f/u  for management of diabetes requested by  Sharilyn Sites, MD  Past Medical History:  Diagnosis Date  . Chronic back pain   . CML (chronic myelocytic leukemia) ([place])   . Diabetes mellitus without complication ([place])   . History of stress test    myoview study suggested the possibility of basal inferior wall ischemia which was present in all 3 tonographic views.  [name] Hx of echocardiogram 05/2012   EF 55%Mildly hypertrophic left ventricle with normal systolic function and early signs of mild diastolic dysfuntion. Moderately dilated left atrium, mild aortic valve sclerosis with out stenosis.  . Hyperlipemia   . Hypertension   . Leukemia [facility])    Past Surgical History:  Procedure Laterality Date  . BACK SURGERY  12/2006  . CARDIAC CATHETERIZATION  2010   revealed mild nonobstructive CAD  . CARDIAC CATHETERIZATION  07/2012   This showed essentially normal coronary arteries without obstruction. there was normal LV function.  [name] KNEE SURGERY  01/1994   Social History   Socioeconomic History  . Marital status: Married    Spouse name: None  . Number of children: None  . Years of education: None  . Highest education level: None  Social Needs  . Financial resource strain: None  . Food insecurity - worry: None  . Food insecurity - inability: None  . Transportation needs - medical: None  . Transportation needs - non-medical: None  Occupational History  . None  Tobacco Use  . Smoking status: Never Smoker  . Smokeless tobacco: Never Used  Substance and Sexual Activity  . Alcohol use: Yes  . Drug use: No  . Sexual activity: None  Other Topics Concern  . None  Social History Narrative  . None   Outpatient Encounter Medications as of 10/07/2017  Medication Sig  . amLODipine (NORVASC) 10 MG tablet TAKE 1 TABLET BY MOUTH ONCE A DAY FOR BLOOD PRESSURE.  [name] aspirin 81 MG tablet Take 81  mg by mouth daily.  [name] atorvastatin (LIPITOR) 20 MG tablet Take 20 mg by mouth at bedtime.   . B-D ULTRAFINE III SHORT PEN 31G X 8 MM MISC USING WITH TOUJEO  . BYSTOLIC 10 MG tablet TAKE ONE TABLET BY MOUTH EVERY DAY.  [name] glucose blood (ONE TOUCH TEST STRIPS) test strip Use as instructed  . imatinib (GLEEVEC) 400 MG tablet Take 400 mg by mouth daily. Take with meals and large glass of water.Caution:Chemotherapy.  . metFORMIN (GLUCOPHAGE) 850 MG tablet TAKE (1) TABLET BY MOUTH TWICE DAILY WITH MEALS.  . Multiple Vitamin (MULTIVITAMIN WITH MINERALS) TABS Take 1 tablet by mouth daily.  . Oxycodone HCl 10 MG TABS Take 10 mg by mouth as needed.   . ramipril (ALTACE) 5 MG capsule TAKE (1) CAPSULE BY MOUTH ONCE DAILY.  [name] TRESIBA FLEXTOUCH 100 UNIT/ML SOPN FlexTouch Pen INJECT 30 UNITS INTO THE SKIN AT BEDTIME.   No facility-administered encounter medications on file as of 10/07/2017.    ALLERGIES: Allergies  Allergen Reactions  . Tylenol [Acetaminophen] Other (See Comments)    "when taking gleevec, do not take tylenol"   VACCINATION STATUS:  There is no immunization history on file for this patient.  Diabetes  He presents for his follow-up diabetic visit. He has type 2 diabetes mellitus. Onset time: He was diagnosed at approximate age of 63 years. His disease course has been improving.  There are no hypoglycemic associated symptoms. Pertinent negatives for hypoglycemia include no confusion, headaches, pallor or seizures. Pertinent negatives for diabetes include no chest pain, no fatigue, no polydipsia, no polyphagia, no polyuria and no weakness. There are no hypoglycemic complications. Symptoms are improving. Diabetic complications include nephropathy. Risk factors for coronary artery disease include diabetes mellitus, dyslipidemia, hypertension, male sex, sedentary lifestyle and tobacco exposure. Current diabetic treatment includes oral agent (dual therapy) and insulin injections. His weight is  increasing steadily. He is following a generally unhealthy diet. When asked about meal planning, he reported none. He has not had a previous visit with a dietitian. He never participates in exercise. His breakfast blood glucose range is generally 140-180 mg/dl. His overall blood glucose range is 140-180 mg/dl. (His average blood glucose for the last week is 347 (number equals 25).) An ACE inhibitor/angiotensin II receptor blocker is being taken. Eye exam is current.  Hyperlipidemia  This is a chronic problem. The current episode started more than 1 year ago. Exacerbating diseases include diabetes and obesity. Pertinent negatives include no chest pain, myalgias or shortness of breath. Current antihyperlipidemic treatment includes statins.  Hypertension  This is a chronic problem. The current episode started more than 1 year ago. The problem is uncontrolled. Pertinent negatives include no chest pain, headaches, neck pain, palpitations or shortness of breath. Past treatments include ACE inhibitors. Hypertensive end-organ damage includes kidney disease.     Review of Systems  Constitutional: Negative for chills, fatigue, fever and unexpected weight change.  HENT: Negative for dental problem, mouth sores and trouble swallowing.   Eyes: Negative for visual disturbance.  Respiratory: Negative for cough, choking, chest tightness, shortness of breath and wheezing.   Cardiovascular: Negative for chest pain, palpitations and leg swelling.  Gastrointestinal: Negative for abdominal distention, abdominal pain, constipation, diarrhea, nausea and vomiting.  Endocrine: Negative for polydipsia, polyphagia and polyuria.  Genitourinary: Negative for dysuria, flank pain, hematuria and urgency.  Musculoskeletal: Negative for back pain, gait problem, myalgias and neck pain.  Skin: Negative for pallor, rash and wound.  Neurological: Negative for seizures, syncope, weakness, numbness and headaches.   Psychiatric/Behavioral: Negative.  Negative for confusion and dysphoric mood.    Objective:    BP 135/84   Pulse 68   Ht _0  (1.854 m)   Wt 240 lb (108.9 kg)   BMI 31.66 kg/m   Wt Readings from Last 3 Encounters:  10/07/17 240 lb (108.9 kg)  06/04/17 236 lb (107 kg)  02/13/17 237 lb (107.5 kg)    Physical Exam  Constitutional: He is oriented to person, place, and time. He appears well-developed. He is cooperative. No distress.  HENT:  Head: Normocephalic and atraumatic.  Eyes: EOM are normal.  Neck: Normal range of motion. Neck supple. No tracheal deviation present. No thyromegaly present.  Cardiovascular: Normal rate, S1 normal, S2 normal and normal heart sounds. Exam reveals no gallop.  No murmur heard. Pulses:      Dorsalis pedis pulses are 1+ on the right side, and 1+ on the left side.       Posterior tibial pulses are 1+ on the right side, and 1+ on the left side.  Pulmonary/Chest: Breath sounds normal. No respiratory distress. He has no wheezes.  Abdominal: Soft. Bowel sounds are normal. He exhibits no distension. There is no tenderness. There is no guarding and no CVA tenderness.  Musculoskeletal: He exhibits no edema.       Right shoulder: He exhibits no swelling and no deformity.  Neurological: He is alert and oriented to person, place, and time. He has normal strength and normal reflexes. No cranial nerve deficit or sensory deficit. Gait normal.  Skin: Skin is warm and dry. No rash noted. No cyanosis. Nails show no clubbing.  Psychiatric: He has a normal mood and affect. His speech is normal. Judgment normal. Cognition and memory are normal.   CMP     Component Value Date/Time   NA 137 10/01/2017 0911   K 4.6 10/01/2017 0911   CL 105 10/01/2017 0911   CO2 25 10/01/2017 0911   GLUCOSE 166 (H) 10/01/2017 0911   BUN 19 10/01/2017 0911   CREATININE 1.31 (H) 10/01/2017 0911   CALCIUM 9.5 10/01/2017 0911   PROT 6.5 05/27/2017 0823   ALBUMIN 4.1 05/27/2017 0823    AST 17 05/27/2017 0823   ALT 17 05/27/2017 0823   ALKPHOS 43 05/27/2017 0823   BILITOT 0.4 05/27/2017 0823   GFRNONAA >60 05/01/2009 0535   GFRAA  05/01/2009 0535    >60        The eGFR has been calculated using the MDRD equation. This calculation has not been validated in all clinical situations. eGFR's persistently <60 mL/min signify possible Chronic Kidney Disease.     Diabetic Labs (most recent): Lab Results  Component Value Date   HGBA1C 7.0 (H) 10/01/2017   HGBA1C 7.5 (H) 05/27/2017   HGBA1C 7.2 (H) 02/06/2017    Assessment & Plan:   1. Uncontrolled type 2 diabetes mellitus with Improving chronic kidney disease.  - Patient has currently uncontrolled symptomatic type 2 DM since  61 years of age. - He came with controlled fasting blood glucose average 144-156-136, A1c 7%, overall   improving from 8.9%. - Recent labs reviewed.   His diabetes is complicated by stage 2 CK D and patient remains at a high risk for more acute and chronic complications of diabetes which include CAD, CVA, CKD, retinopathy, and neuropathy. These are all discussed in detail with the patient.  - I have counseled the patient on diet management and weight loss, by adopting a carbohydrate restricted/protein rich diet.  -  Suggestion is made for him to avoid simple carbohydrates  from his diet including Cakes, Sweet Desserts / Pastries, Ice Cream, Soda (diet and regular), Sweet Tea, Candies, Chips, Cookies, Store Bought Juices, Alcohol in Excess of  1-2 drinks a day, Artificial Sweeteners, and "Sugar-free" Products. This will help patient to have stable blood glucose profile and potentially avoid unintended weight gain.   - I encouraged the patient to switch to  unprocessed or minimally processed complex starch and increased protein intake (animal or plant source), fruits, and vegetables.  - Patient is advised to stick to a routine mealtimes to eat 3 meals  a day and avoid unnecessary snacks ( to  snack only to correct hypoglycemia).   - I have approached patient with the following individualized plan to manage diabetes and patient agrees:   -  I approached him  to stay on basal insulin and he agrees. -  I will continue Tresiba  30 units daily at bedtime associated with monitoring of blood glucose before breakfast and any other time as needed.  -Patient is encouraged to call clinic for blood glucose levels less than 70 or above 200 mg /dl.  - He has stable  renal function, he will continue to benefit from metformin therapy. I advised him to continue metformin 850 minutes by mouth twice a day after meals.  - Patient  specific target  A1c;  LDL, HDL, Triglycerides, and  Waist Circumference were discussed in detail.  2) BP/HTN: uncontrolled. Continue current medications including ACEI/ARB. 3) Lipids/HPL:  Control unknown,  continue statins. 4)  Weight/Diet: CDE Consult has been initiated , exercise, and detailed carbohydrates information provided.  5) Chronic Care/Health Maintenance:  -Patient is on ACEI/ARB and Statin medications and encouraged to continue to follow up with Ophthalmology, Podiatrist at least yearly or according to recommendations, and advised to   stay away from smoking. I have recommended yearly flu vaccine and pneumonia vaccination at least every 5 years; moderate intensity exercise for up to 150 minutes weekly; and  sleep for at least 7 hours a day.  - Time spent with the patient: 25 min, of which >50% was spent in reviewing his sugar logs , discussing his hypo- and hyper-glycemic episodes, reviewing his current and  previous labs and insulin doses and developing a plan to avoid hypo- and hyper-glycemia.  - I advised patient to maintain close follow up with Sharilyn Sites, MD for primary care needs.  Follow up plan: - Return in about 4 months (around 02/04/2018) for follow up with pre-visit labs, meter, and logs.  Glade Lloyd, MD Phone: [phone]  Fax: [phone]   -  This note was partially dictated with voice recognition software. Similar sounding words can be transcribed inadequately or may not  be corrected upon review.  10/07/2017, 2:43 PM

## 2017-10-15 ENCOUNTER — Other Ambulatory Visit: Payer: Self-pay | Admitting: "Endocrinology

## 2017-10-15 ENCOUNTER — Other Ambulatory Visit: Payer: Self-pay | Admitting: Cardiovascular Disease

## 2017-10-15 DIAGNOSIS — Z79899 Other long term (current) drug therapy: Secondary | ICD-10-CM | POA: Diagnosis not present

## 2017-10-15 DIAGNOSIS — D509 Iron deficiency anemia, unspecified: Secondary | ICD-10-CM | POA: Diagnosis not present

## 2017-10-15 DIAGNOSIS — E559 Vitamin D deficiency, unspecified: Secondary | ICD-10-CM | POA: Diagnosis not present

## 2017-10-15 NOTE — Telephone Encounter (Signed)
REFILL 

## 2017-10-23 DIAGNOSIS — N182 Chronic kidney disease, stage 2 (mild): Secondary | ICD-10-CM | POA: Diagnosis not present

## 2017-10-23 DIAGNOSIS — R809 Proteinuria, unspecified: Secondary | ICD-10-CM | POA: Diagnosis not present

## 2017-10-23 DIAGNOSIS — I10 Essential (primary) hypertension: Secondary | ICD-10-CM | POA: Diagnosis not present

## 2017-11-15 ENCOUNTER — Other Ambulatory Visit: Payer: Self-pay | Admitting: "Endocrinology

## 2017-11-15 ENCOUNTER — Other Ambulatory Visit: Payer: Self-pay | Admitting: Cardiovascular Disease

## 2017-12-09 ENCOUNTER — Other Ambulatory Visit: Payer: Self-pay | Admitting: "Endocrinology

## 2017-12-16 ENCOUNTER — Other Ambulatory Visit: Payer: Self-pay | Admitting: Cardiovascular Disease

## 2017-12-19 DIAGNOSIS — M62838 Other muscle spasm: Secondary | ICD-10-CM | POA: Diagnosis not present

## 2017-12-19 DIAGNOSIS — N189 Chronic kidney disease, unspecified: Secondary | ICD-10-CM | POA: Diagnosis not present

## 2017-12-19 DIAGNOSIS — C9210 Chronic myeloid leukemia, BCR/ABL-positive, not having achieved remission: Secondary | ICD-10-CM | POA: Diagnosis not present

## 2017-12-19 DIAGNOSIS — Z79899 Other long term (current) drug therapy: Secondary | ICD-10-CM | POA: Diagnosis not present

## 2017-12-19 DIAGNOSIS — C9211 Chronic myeloid leukemia, BCR/ABL-positive, in remission: Secondary | ICD-10-CM | POA: Diagnosis not present

## 2018-01-13 DIAGNOSIS — Z6833 Body mass index (BMI) 33.0-33.9, adult: Secondary | ICD-10-CM | POA: Diagnosis not present

## 2018-01-13 DIAGNOSIS — I10 Essential (primary) hypertension: Secondary | ICD-10-CM | POA: Diagnosis not present

## 2018-01-13 DIAGNOSIS — E6609 Other obesity due to excess calories: Secondary | ICD-10-CM | POA: Diagnosis not present

## 2018-01-13 DIAGNOSIS — G8929 Other chronic pain: Secondary | ICD-10-CM | POA: Diagnosis not present

## 2018-01-13 DIAGNOSIS — E1140 Type 2 diabetes mellitus with diabetic neuropathy, unspecified: Secondary | ICD-10-CM | POA: Diagnosis not present

## 2018-01-13 DIAGNOSIS — Z1389 Encounter for screening for other disorder: Secondary | ICD-10-CM | POA: Diagnosis not present

## 2018-01-13 DIAGNOSIS — C9210 Chronic myeloid leukemia, BCR/ABL-positive, not having achieved remission: Secondary | ICD-10-CM | POA: Diagnosis not present

## 2018-01-13 DIAGNOSIS — E785 Hyperlipidemia, unspecified: Secondary | ICD-10-CM | POA: Diagnosis not present

## 2018-01-14 ENCOUNTER — Other Ambulatory Visit: Payer: Self-pay | Admitting: Cardiovascular Disease

## 2018-01-14 NOTE — Telephone Encounter (Signed)
Rx(s) sent to pharmacy electronically.  

## 2018-01-28 ENCOUNTER — Other Ambulatory Visit: Payer: Self-pay | Admitting: Cardiovascular Disease

## 2018-01-28 DIAGNOSIS — E1165 Type 2 diabetes mellitus with hyperglycemia: Secondary | ICD-10-CM | POA: Diagnosis not present

## 2018-01-28 DIAGNOSIS — Z794 Long term (current) use of insulin: Secondary | ICD-10-CM | POA: Diagnosis not present

## 2018-01-28 DIAGNOSIS — E1122 Type 2 diabetes mellitus with diabetic chronic kidney disease: Secondary | ICD-10-CM | POA: Diagnosis not present

## 2018-01-29 LAB — COMPLETE METABOLIC PANEL WITH GFR
AG Ratio: 1.9 (calc) (ref 1.0–2.5)
ALBUMIN MSPROF: 3.9 g/dL (ref 3.6–5.1)
ALKALINE PHOSPHATASE (APISO): 48 U/L (ref 40–115)
ALT: 16 U/L (ref 9–46)
AST: 15 U/L (ref 10–35)
BUN / CREAT RATIO: 12 (calc) (ref 6–22)
BUN: 18 mg/dL (ref 7–25)
CO2: 26 mmol/L (ref 20–32)
CREATININE: 1.49 mg/dL — AB (ref 0.70–1.25)
Calcium: 9.1 mg/dL (ref 8.6–10.3)
Chloride: 106 mmol/L (ref 98–110)
GFR, EST NON AFRICAN AMERICAN: 50 mL/min/{1.73_m2} — AB (ref >=60)
GFR, Est African American: 57 mL/min/{1.73_m2} — ABNORMAL LOW (ref >=60)
GLOBULIN: 2.1 g/dL (ref 1.9–3.7)
GLUCOSE: 151 mg/dL — AB (ref 65–139)
Potassium: 4.4 mmol/L (ref 3.5–5.3)
SODIUM: 138 mmol/L (ref 135–146)
Total Bilirubin: 0.3 mg/dL (ref 0.2–1.2)
Total Protein: 6 g/dL — ABNORMAL LOW (ref 6.1–8.1)

## 2018-01-29 LAB — T4, FREE: FREE T4: 1.3 ng/dL (ref 0.8–1.8)

## 2018-01-29 LAB — HEMOGLOBIN A1C
EAG (MMOL/L): 8.7 (calc)
HEMOGLOBIN A1C: 7.1 %{Hb} — AB (ref <5.7)
MEAN PLASMA GLUCOSE: 157 (calc)

## 2018-01-29 LAB — TSH: TSH: 2.17 m[IU]/L (ref 0.40–4.50)

## 2018-02-04 ENCOUNTER — Ambulatory Visit (INDEPENDENT_AMBULATORY_CARE_PROVIDER_SITE_OTHER): Payer: 59 | Admitting: "Endocrinology

## 2018-02-04 ENCOUNTER — Encounter: Payer: Self-pay | Admitting: "Endocrinology

## 2018-02-04 VITALS — BP 134/84 | HR 76 | Ht 73.0 in | Wt 244.0 lb

## 2018-02-04 DIAGNOSIS — I10 Essential (primary) hypertension: Secondary | ICD-10-CM | POA: Diagnosis not present

## 2018-02-04 DIAGNOSIS — E782 Mixed hyperlipidemia: Secondary | ICD-10-CM | POA: Diagnosis not present

## 2018-02-04 DIAGNOSIS — IMO0002 Reserved for concepts with insufficient information to code with codable children: Secondary | ICD-10-CM

## 2018-02-04 DIAGNOSIS — E1122 Type 2 diabetes mellitus with diabetic chronic kidney disease: Secondary | ICD-10-CM | POA: Diagnosis not present

## 2018-02-04 DIAGNOSIS — N183 Chronic kidney disease, stage 3 (moderate): Secondary | ICD-10-CM

## 2018-02-04 DIAGNOSIS — Z794 Long term (current) use of insulin: Secondary | ICD-10-CM

## 2018-02-04 DIAGNOSIS — E1165 Type 2 diabetes mellitus with hyperglycemia: Secondary | ICD-10-CM | POA: Diagnosis not present

## 2018-02-04 MED ORDER — METFORMIN HCL 500 MG PO TABS: ORAL_TABLET | ORAL | 3 refills | Status: DC

## 2018-02-04 MED ORDER — INSULIN DEGLUDEC 100 UNIT/ML ~~LOC~~ SOPN: PEN_INJECTOR | SUBCUTANEOUS | 2 refills | Status: DC

## 2018-02-04 NOTE — Progress Notes (Signed)
Subjective:    Patient ID: Dylan Spears, male    DOB: June 30, 1956. Patient is being seen in f/u  for management of diabetes requested by  Sharilyn Sites, MD  Past Medical History:  Diagnosis Date  . Chronic back pain   . CML (chronic myelocytic leukemia) ([place])   . Diabetes mellitus without complication ([place])   . History of stress test    myoview study suggested the possibility of basal inferior wall ischemia which was present in all 3 tonographic views.  [name] Hx of echocardiogram 05/2012   EF 55%Mildly hypertrophic left ventricle with normal systolic function and early signs of mild diastolic dysfuntion. Moderately dilated left atrium, mild aortic valve sclerosis with out stenosis.  . Hyperlipemia   . Hypertension   . Leukemia [facility])    Past Surgical History:  Procedure Laterality Date  . BACK SURGERY  12/2006  . CARDIAC CATHETERIZATION  2010   revealed mild nonobstructive CAD  . CARDIAC CATHETERIZATION  07/2012   This showed essentially normal coronary arteries without obstruction. there was normal LV function.  [name] KNEE SURGERY  01/1994  . LEFT HEART CATHETERIZATION WITH CORONARY ANGIOGRAM N/A 07/29/2012   Procedure: LEFT HEART CATHETERIZATION WITH CORONARY ANGIOGRAM;  Surgeon: Troy Sine, MD;  Location: [facility] CATH LAB;  Service: Cardiovascular;  Laterality: N/A;   Social History   Socioeconomic History  . Marital status: Married    Spouse name: None  . Number of children: None  . Years of education: None  . Highest education level: None  Social Needs  . Financial resource strain: None  . Food insecurity - worry: None  . Food insecurity - inability: None  . Transportation needs - medical: None  . Transportation needs - non-medical: None  Occupational History  . None  Tobacco Use  . Smoking status: Never Smoker  . Smokeless tobacco: Never Used  Substance and Sexual Activity  . Alcohol use: Yes  . Drug use: No  . Sexual activity: None  Other Topics Concern  . None   Social History Narrative  . None   Outpatient Encounter Medications as of 02/04/2018  Medication Sig  . amLODipine (NORVASC) 10 MG tablet Take 1 tablet (10 mg total) by mouth daily. <PLEASE MAKE APPOINTMENT FOR REFILLS>  . aspirin 81 MG tablet Take 81 mg by mouth daily.  [name] atorvastatin (LIPITOR) 20 MG tablet Take 20 mg by mouth at bedtime.   . B-D ULTRAFINE III SHORT PEN 31G X 8 MM MISC USING WITH TOUJEO  . glucose blood (ONE TOUCH ULTRA TEST) test strip TESTING UP TO THREE TIMES DAILY.  [name] imatinib (GLEEVEC) 400 MG tablet Take 400 mg by mouth daily. Take with meals and large glass of water.Caution:Chemotherapy.  . insulin degludec (TRESIBA FLEXTOUCH) 100 UNIT/ML SOPN FlexTouch Pen INJECT 34 UNITS INTO THE SKIN AT BEDTIME.  . metFORMIN (GLUCOPHAGE) 500 MG tablet TAKE (1) TABLET BY MOUTH TWICE DAILY WITH MEALS.  . Multiple Vitamin (MULTIVITAMIN WITH MINERALS) TABS Take 1 tablet by mouth daily.  . nebivolol (BYSTOLIC) 10 MG tablet Take 1 tablet (10 mg total) by mouth daily. Please keep upcoming appt for further refills. Thanks!  . Oxycodone HCl 10 MG TABS Take 10 mg by mouth as needed.   . ramipril (ALTACE) 5 MG capsule Take 1 capsule (5 mg total) by mouth daily. Please keep upcoming appt for further refills.  . [DISCONTINUED] metFORMIN (GLUCOPHAGE) 850 MG tablet TAKE (1) TABLET BY MOUTH TWICE DAILY WITH MEALS.  . [DISCONTINUED]  TRESIBA FLEXTOUCH 100 UNIT/ML SOPN FlexTouch Pen INJECT 30 UNITS INTO THE SKIN AT BEDTIME.   No facility-administered encounter medications on file as of 02/04/2018.    ALLERGIES: Allergies  Allergen Reactions  . Tylenol [Acetaminophen] Other (See Comments)    "when taking gleevec, do not take tylenol"   VACCINATION STATUS:  There is no immunization history on file for this patient.  Diabetes  He presents for his follow-up diabetic visit. He has type 2 diabetes mellitus. Onset time: He was diagnosed at approximate age of 51 years. His disease course has been  stable. There are no hypoglycemic associated symptoms. Pertinent negatives for hypoglycemia include no confusion, headaches, pallor or seizures. Pertinent negatives for diabetes include no chest pain, no fatigue, no polydipsia, no polyphagia, no polyuria and no weakness. There are no hypoglycemic complications. Symptoms are stable. Diabetic complications include nephropathy. Risk factors for coronary artery disease include diabetes mellitus, dyslipidemia, hypertension, male sex, sedentary lifestyle and tobacco exposure. Current diabetic treatment includes oral agent (dual therapy) and insulin injections. His weight is increasing steadily. He is following a generally unhealthy diet. When asked about meal planning, he reported none. He has not had a previous visit with a dietitian. He never participates in exercise. His breakfast blood glucose range is generally 140-180 mg/dl. His overall blood glucose range is 140-180 mg/dl. An ACE inhibitor/angiotensin II receptor blocker is being taken. Eye exam is current.  Hyperlipidemia  This is a chronic problem. The current episode started more than 1 year ago. Exacerbating diseases include diabetes and obesity. Pertinent negatives include no chest pain, myalgias or shortness of breath. Current antihyperlipidemic treatment includes statins. Risk factors for coronary artery disease include dyslipidemia, hypertension, male sex, a sedentary lifestyle and obesity.  Hypertension  This is a chronic problem. The current episode started more than 1 year ago. The problem is uncontrolled. Pertinent negatives include no chest pain, headaches, neck pain, palpitations or shortness of breath. Risk factors for coronary artery disease include dyslipidemia, diabetes mellitus, male gender, obesity and sedentary lifestyle. Past treatments include ACE inhibitors. Hypertensive end-organ damage includes kidney disease.     Review of Systems  Constitutional: Negative for chills, fatigue,  fever and unexpected weight change.  HENT: Negative for dental problem, mouth sores and trouble swallowing.   Eyes: Negative for visual disturbance.  Respiratory: Negative for cough, choking, chest tightness, shortness of breath and wheezing.   Cardiovascular: Negative for chest pain, palpitations and leg swelling.  Gastrointestinal: Negative for abdominal distention, abdominal pain, constipation, diarrhea, nausea and vomiting.  Endocrine: Negative for polydipsia, polyphagia and polyuria.  Genitourinary: Negative for dysuria, flank pain, hematuria and urgency.  Musculoskeletal: Negative for back pain, gait problem, myalgias and neck pain.  Skin: Negative for pallor, rash and wound.  Neurological: Negative for seizures, syncope, weakness, numbness and headaches.  Psychiatric/Behavioral: Negative.  Negative for confusion and dysphoric mood.    Objective:    BP 134/84   Pulse 76   Ht 6\' 1"  (1.854 m)   Wt 244 lb (110.7 kg)   BMI 32.19 kg/m   Wt Readings from Last 3 Encounters:  02/04/18 244 lb (110.7 kg)  10/07/17 240 lb (108.9 kg)  06/04/17 236 lb (107 kg)    Physical Exam  Constitutional: He is oriented to person, place, and time. He appears well-developed. He is cooperative. No distress.  HENT:  Head: Normocephalic and atraumatic.  Eyes: EOM are normal.  Neck: Normal range of motion. Neck supple. No tracheal deviation present. No thyromegaly present.  Cardiovascular: Normal rate, S1 normal, S2 normal and normal heart sounds. Exam reveals no gallop.  No murmur heard. Pulses:      Dorsalis pedis pulses are 1+ on the right side, and 1+ on the left side.       Posterior tibial pulses are 1+ on the right side, and 1+ on the left side.  Pulmonary/Chest: Breath sounds normal. No respiratory distress. He has no wheezes.  Abdominal: Soft. Bowel sounds are normal. He exhibits no distension. There is no tenderness. There is no guarding and no CVA tenderness.  Musculoskeletal: He exhibits  no edema.       Right shoulder: He exhibits no swelling and no deformity.  Neurological: He is alert and oriented to person, place, and time. He has normal strength and normal reflexes. No cranial nerve deficit or sensory deficit. Gait normal.  Skin: Skin is warm and dry. No rash noted. No cyanosis. Nails show no clubbing.  Psychiatric: He has a normal mood and affect. His speech is normal. Judgment normal. Cognition and memory are normal.   CMP     Component Value Date/Time   NA 138 01/28/2018 0758   K 4.4 01/28/2018 0758   CL 106 01/28/2018 0758   CO2 26 01/28/2018 0758   GLUCOSE 151 (H) 01/28/2018 0758   BUN 18 01/28/2018 0758   CREATININE 1.49 (H) 01/28/2018 0758   CALCIUM 9.1 01/28/2018 0758   PROT 6.0 (L) 01/28/2018 0758   ALBUMIN 4.1 05/27/2017 0823   AST 15 01/28/2018 0758   ALT 16 01/28/2018 0758   ALKPHOS 43 05/27/2017 0823   BILITOT 0.3 01/28/2018 0758   GFRNONAA 50 (L) 01/28/2018 0758   GFRAA 57 (L) 01/28/2018 0758     Diabetic Labs (most recent): Lab Results  Component Value Date   HGBA1C 7.1 (H) 01/28/2018   HGBA1C 7.0 (H) 10/01/2017   HGBA1C 7.5 (H) 05/27/2017    Assessment & Plan:   1. Uncontrolled type 2 diabetes mellitus with worsening chronic kidney disease.  - Patient has currently uncontrolled symptomatic type 2 DM since  62 years of age. - He came with controlled fasting blood glucose average of 142 over the last 30 days.  His repeat labs show A1c of 7.1%, overall improving from 8.9%.   - Recent labs reviewed.   His diabetes is complicated by stage 2 CK D and patient remains at a high risk for more acute and chronic complications of diabetes which include CAD, CVA, CKD, retinopathy, and neuropathy. These are all discussed in detail with the patient.  - I have counseled the patient on diet management and weight loss, by adopting a carbohydrate restricted/protein rich diet.  -  Suggestion is made for him to avoid simple carbohydrates  from his diet  including Cakes, Sweet Desserts / Pastries, Ice Cream, Soda (diet and regular), Sweet Tea, Candies, Chips, Cookies, Store Bought Juices, Alcohol in Excess of  1-2 drinks a day, Artificial Sweeteners, and "Sugar-free" Products. This will help patient to have stable blood glucose profile and potentially avoid unintended weight gain.  - I encouraged the patient to switch to  unprocessed or minimally processed complex starch and increased protein intake (animal or plant source), fruits, and vegetables.  - Patient is advised to stick to a routine mealtimes to eat 3 meals  a day and avoid unnecessary snacks ( to snack only to correct hypoglycemia).   - I have approached patient with the following individualized plan to manage diabetes and patient agrees:   -  I approached him  to stay on basal insulin and he agrees. -  I will increase his Tyler Aas to 34  units daily at bedtime associated with monitoring of blood glucose before breakfast and any other time as needed.  -Patient is encouraged to call clinic for blood glucose levels less than 70 or above 200 mg /dl.  - His labs show increasing serum creatinine, I will decrease his metformin to 500 mg p.o. twice daily after meals.  He is advised to avoid any over-the-counter NSAIDs and avoid dehydration.    - Patient specific target  A1c;  LDL, HDL, Triglycerides, and  Waist Circumference were discussed in detail.  2) BP/HTN: His blood pressure is controlled to target.   Continue current medications including ACEI/ARB. 3) Lipids/HPL:  Control unknown,  continue statins. 4)  Weight/Diet: CDE Consult has been initiated , exercise, and detailed carbohydrates information provided.  5) Chronic Care/Health Maintenance:  -Patient is on ACEI/ARB and Statin medications and encouraged to continue to follow up with Ophthalmology, Podiatrist at least yearly or according to recommendations, and advised to   stay away from smoking. I have recommended yearly flu vaccine  and pneumonia vaccination at least every 5 years; moderate intensity exercise for up to 150 minutes weekly; and  sleep for at least 7 hours a day.  - I advised patient to maintain close follow up with Sharilyn Sites, MD for primary care needs.  - Time spent with the patient: 25 min, of which >50% was spent in reviewing his blood glucose logs , discussing his hypo- and hyper-glycemic episodes, reviewing his current and  previous labs and insulin doses and developing a plan to avoid hypo- and hyper-glycemia. Please refer to Patient Instructions for Blood Glucose Monitoring and Insulin/Medications Dosing Guide"  in media tab for additional information.  Follow up plan: - Return in about 6 months (around 08/07/2018) for meter, and logs.  Glade Lloyd, MD Phone: [phone]  Fax: [phone]  -  This note was partially dictated with voice recognition software. Similar sounding words can be transcribed inadequately or may not  be corrected upon review.  02/04/2018, 4:45 PM

## 2018-02-04 NOTE — Patient Instructions (Signed)

## 2018-02-18 ENCOUNTER — Ambulatory Visit (INDEPENDENT_AMBULATORY_CARE_PROVIDER_SITE_OTHER): Payer: Self-pay

## 2018-02-18 DIAGNOSIS — Z86010 Personal history of colonic polyps: Secondary | ICD-10-CM

## 2018-02-18 MED ORDER — PEG 3350-KCL-NA BICARB-NACL 420 G PO SOLR: 4000.0000 mL | ORAL | 0 refills | Status: DC

## 2018-02-18 NOTE — Progress Notes (Signed)
Gastroenterology Pre-Procedure Review  Request Date:02/18/18 Requesting Physician:  Dr.Golding (last tcs 06/25/08 RMR- tubulovillous adenoma)  PATIENT REVIEW QUESTIONS: The patient responded to the following health history questions as indicated:    1. Diabetes Melitis: yes on tresiba, metformin 2. Joint replacements in the past 12 months: no 3. Major health problems in the past 3 months: no 4. Has an artificial valve or MVP: no 5. Has a defibrillator: no 6. Has been advised in past to take antibiotics in advance of a procedure like teeth cleaning: no 7. Family history of colon cancer: yes (mother  age 10)  60. Alcohol Use: no 9. History of sleep apnea: no  10. History of coronary artery or other vascular stents placed within the last 12 months: no 11. History of any prior anesthesia complications: no    MEDICATIONS & ALLERGIES:    Patient reports the following regarding taking any blood thinners:   Plavix? no Aspirin? yes (81mg ) Coumadin? no Brilinta? no Xarelto? no Eliquis? no Pradaxa? no Savaysa? no Effient? no  Patient confirms/reports the following medications:  Current Outpatient Medications  Medication Sig Dispense Refill  . amLODipine (NORVASC) 5 MG tablet Take by mouth daily.    [name] aspirin 81 MG tablet Take 81 mg by mouth daily.    [name] atorvastatin (LIPITOR) 20 MG tablet Take 20 mg by mouth at bedtime.     [name] imatinib (GLEEVEC) 400 MG tablet Take 400 mg by mouth daily. Take with meals and large glass of water.Caution:Chemotherapy.    . insulin degludec (TRESIBA FLEXTOUCH) 100 UNIT/ML SOPN FlexTouch Pen INJECT 34 UNITS INTO THE SKIN AT BEDTIME. 15 mL 2  . metFORMIN (GLUCOPHAGE) 500 MG tablet TAKE (1) TABLET BY MOUTH TWICE DAILY WITH MEALS. 60 tablet 3  . Multiple Vitamin (MULTIVITAMIN WITH MINERALS) TABS Take 1 tablet by mouth daily.    . nebivolol (BYSTOLIC) 10 MG tablet Take 1 tablet (10 mg total) by mouth daily. Please keep upcoming appt for further refills. Thanks!  (Patient taking differently: Take 5 mg by mouth daily. Please keep upcoming appt for further refills. Thanks!) 30 tablet 0  . Oxycodone HCl 10 MG TABS Take 10 mg by mouth as needed.     . ramipril (ALTACE) 5 MG capsule Take 1 capsule (5 mg total) by mouth daily. Please keep upcoming appt for further refills. 30 capsule 0  . B-D ULTRAFINE III SHORT PEN 31G X 8 MM MISC USING WITH TOUJEO 100 each 5  . glucose blood (ONE TOUCH ULTRA TEST) test strip TESTING UP TO THREE TIMES DAILY. 100 each 2   No current facility-administered medications for this visit.     Patient confirms/reports the following allergies:  Allergies  Allergen Reactions  . Tylenol [Acetaminophen] Other (See Comments)    "when taking gleevec, do not take tylenol"    No orders of the defined types were placed in this encounter.   AUTHORIZATION INFORMATION Primary Insurance: medicare   ID #: [id number] Pre-Cert / Auth required: no   SCHEDULE INFORMATION: Procedure has been scheduled as follows:  Date: 04/09/18, Time: 10:30 Location: [facility]   This Gastroenterology Pre-Precedure Review Form is being routed to the following provider(s): Walden Field NP

## 2018-02-18 NOTE — Patient Instructions (Signed)
Dylan Spears   1956/04/21 MRN: [id number]    Procedure Date: 04/09/18 Time to register: 9:30 Place to register: [facility] Procedure Time: 10:30 Scheduled provider: R. Garfield Cornea, MD  PREPARATION FOR COLONOSCOPY WITH TRI-LYTE SPLIT PREP  Please notify us immediately if you are diabetic, take iron supplements, or if you are on Coumadin or any other blood thinners.   Please hold the following medications:   You will need to purchase 1 fleet enema and 1 box of Bisacodyl '5mg'$  tablets.   2 DAYS BEFORE PROCEDURE:  DATE: 04/07/18  DAY: Monday Begin clear liquid diet AFTER your lunch meal. NO SOLID FOODS after this point.  1 DAY BEFORE PROCEDURE:  DATE: 04/08/18  DAY: Tuesday Continue clear liquids the entire day - NO SOLID FOOD.   Diabetic medications adjustments for today:   At 2:00 pm:  Take 2 Bisacodyl tablets.   At 4:00pm:  Start drinking your solution. Make sure you mix well per instructions on the bottle. Try to drink 1 (one) 8 ounce glass every 10-15 minutes until you have consumed HALF the jug. You should complete by 6:00pm.You must keep the left over solution refrigerated until completed next day.  Continue clear liquids. You must drink plenty of clear liquids to prevent dehyration and kidney failure. Nothing to eat or drink after midnight.  EXCEPTION: If you take medications for your heart, blood pressure or breathing, you may take these medications with a small amount of clear liquid.    DAY OF PROCEDURE:   DATE: 04/09/18   DAY: Wednesday  Diabetic medications adjustments for today:   Five hours before your procedure time @ 5:30am:  Finish remaining amout of bowel prep, drinking 1 (one) 8 ounce glass every 10-15 minutes until complete. You have two hours to consume remaining prep.   Three hours before your procedure time '@7'$ :30am:  Nothing by mouth.   At least one hour before going to the hospital:  Give yourself one Fleet enema. You may take your morning  medications with sip of water unless we have instructed otherwise.      Please see below for Dietary Information.  CLEAR LIQUIDS INCLUDE:  Water Jello (NOT red in color)   Ice Popsicles (NOT red in color)   Tea (sugar ok, no milk/cream) Powdered fruit flavored drinks  Coffee (sugar ok, no milk/cream) Gatorade/ Lemonade/ Kool-Aid  (NOT red in color)   Juice: apple, white grape, white cranberry Soft drinks  Clear bullion, consomme, broth (fat free beef/chicken/vegetable)  Carbonated beverages (any kind)  Strained chicken noodle soup Hard Candy   Remember: Clear liquids are liquids that will allow you to see your fingers on the other side of a clear glass. Be sure liquids are NOT red in color, and not cloudy, but CLEAR.  DO NOT EAT OR DRINK ANY OF THE FOLLOWING:  Dairy products of any kind   Cranberry juice Tomato juice / V8 juice   Grapefruit juice Orange juice     Red grape juice  Do not eat any solid foods, including such foods as: cereal, oatmeal, yogurt, fruits, vegetables, creamed soups, eggs, bread, crackers, pureed foods in a blender, etc.   HELPFUL HINTS FOR DRINKING PREP SOLUTION:   Make sure prep is extremely cold. Mix and refrigerate the the morning of the prep. You may also put in the freezer.   You may try mixing some Crystal Light or Country Time Lemonade if you prefer. Mix in small amounts; add more if necessary.  Try drinking through a straw  Rinse mouth with water or a mouthwash between glasses, to remove after-taste.  Try sipping on a cold beverage /ice/ popsicles between glasses of prep.  Place a piece of sugar-free hard candy in mouth between glasses.  If you become nauseated, try consuming smaller amounts, or stretch out the time between glasses. Stop for 30-60 minutes, then slowly start back drinking.        OTHER INSTRUCTIONS  You will need a responsible adult at least 63 years of age to accompany you and drive you home. This person must remain  in the waiting room during your procedure. The hospital will cancel your procedure if you do not have a responsible adult with you.   1. Wear loose fitting clothing that is easily removed. 2. Leave jewelry and other valuables at home.  3. Remove all body piercing jewelry and leave at home. 4. Total time from sign-in until discharge is approximately 2-3 hours. 5. You should go home directly after your procedure and rest. You can resume normal activities the day after your procedure. 6. The day of your procedure you should not:  Drive  Make legal decisions  Operate machinery  Drink alcohol  Return to work   You may call the office (Dept: [phone]) before 5:00pm, or page the doctor on call [phone]) after 5:00pm, for further instructions, if necessary.   Insurance Information YOU WILL NEED TO CHECK WITH YOUR INSURANCE COMPANY FOR THE BENEFITS OF COVERAGE YOU HAVE FOR THIS PROCEDURE.  UNFORTUNATELY, NOT ALL INSURANCE COMPANIES HAVE BENEFITS TO COVER ALL OR PART OF THESE TYPES OF PROCEDURES.  IT IS YOUR RESPONSIBILITY TO CHECK YOUR BENEFITS, HOWEVER, WE WILL BE GLAD TO ASSIST YOU WITH ANY CODES YOUR INSURANCE COMPANY MAY NEED.    PLEASE NOTE THAT MOST INSURANCE COMPANIES WILL NOT COVER A SCREENING COLONOSCOPY FOR PEOPLE UNDER THE AGE OF 50  IF YOU HAVE BC[employer] INSURANCE, YOU MAY HAVE BENEFITS FOR A SCREENING COLONOSCOPY BUT IF POLYPS ARE FOUND THE DIAGNOSIS WILL CHANGE AND THEN YOU MAY HAVE A DEDUCTIBLE THAT WILL NEED TO BE MET. SO PLEASE MAKE SURE YOU CHECK YOUR BENEFITS FOR A SCREENING COLONOSCOPY AS WELL AS A DIAGNOSTIC COLONOSCOPY.

## 2018-02-19 NOTE — Progress Notes (Signed)
Please schedule ov.  

## 2018-02-19 NOTE — Progress Notes (Signed)
Will need OV for likely propofol due to chronic pain medications and also due to histories (on chemo, personal history of TVA and maternal history of colon CA.)

## 2018-02-20 ENCOUNTER — Encounter: Payer: Self-pay | Admitting: Internal Medicine

## 2018-02-25 ENCOUNTER — Other Ambulatory Visit: Payer: Self-pay | Admitting: Cardiovascular Disease

## 2018-02-25 NOTE — Telephone Encounter (Signed)
Rx(s) sent to pharmacy electronically.  

## 2018-02-26 NOTE — Progress Notes (Signed)
Pt is aware and he is aware of appt date and time. Current tcs cancelled.

## 2018-03-07 ENCOUNTER — Other Ambulatory Visit: Payer: Self-pay | Admitting: "Endocrinology

## 2018-03-14 ENCOUNTER — Encounter: Payer: Self-pay | Admitting: Cardiovascular Disease

## 2018-03-14 ENCOUNTER — Ambulatory Visit (INDEPENDENT_AMBULATORY_CARE_PROVIDER_SITE_OTHER): Payer: 59 | Admitting: Cardiovascular Disease

## 2018-03-14 VITALS — BP 134/82 | HR 68 | Ht 73.0 in | Wt 247.0 lb

## 2018-03-14 DIAGNOSIS — E669 Obesity, unspecified: Secondary | ICD-10-CM

## 2018-03-14 DIAGNOSIS — C9210 Chronic myeloid leukemia, BCR/ABL-positive, not having achieved remission: Secondary | ICD-10-CM

## 2018-03-14 DIAGNOSIS — N289 Disorder of kidney and ureter, unspecified: Secondary | ICD-10-CM | POA: Diagnosis not present

## 2018-03-14 DIAGNOSIS — I10 Essential (primary) hypertension: Secondary | ICD-10-CM | POA: Diagnosis not present

## 2018-03-14 DIAGNOSIS — E782 Mixed hyperlipidemia: Secondary | ICD-10-CM | POA: Diagnosis not present

## 2018-03-14 NOTE — Patient Instructions (Signed)

## 2018-03-14 NOTE — Progress Notes (Signed)
Patient ID: Michaiah Holsopple, male   DOB: July 17, 1956, 62 y.o.   MRN: [id number]     PCP:  Dr. Hilma Favors   HPI: Umair Rosiles, is a 62 y.o. male who presents to the office today for a 52 month cardiology evaluation.   Mr. Sensing has a history of CML which was originally diagnosed in 2004, as well as a history of hypertension, hyperlipidemia, and type 2 diabetes mellitus. In 2010 he developed nitrate responsive chest pain which led to cardiac catheterization.  Catheterization revealed normal coronary arteries and normal LV function. In 2013 because of recurrent chest pain symptoms and echo Doppler study as well as Myoview scan were done. With exercise Myoview he exercised to a 9 minute workload. There was a question of borderline mild ischemia in the basal inferior wall. Ejection fraction is 54%. An echo Doppler study showed normal systolic and diastolic function. There was a question of pseudo-dyskinesis of the inferior wall due to increased infradiaphragmatic pressure.   He had mild aortic sclerosis.  I last saw him in November 2017.  At that time, he had noted some mild shortness of breath with significant activity.  Since I last saw him, Dr. Hilma Favors reduce his amlodipine dose and he now is on amlodipine at 5 mg, Bystolic 5 mg and ramipril 5 mg daily for hypertension.  He continues to be on atorvastatin 20 mg for hyperlipidemia.  He is diabetic on metformin and chest CT but flex, touch insulin.  His CML is in remission and he continues to be on Gleevec.  He admits to some weight gain over the past year and a half.  He also has not been exercising regularly.  He is retired.  He had laboratory in October 2018 by Dr. Hilma Favors which showed a cholesterol 123, HDL 43, LDL 64, triglycerides 79.  Recent hemoglobin A1c in March 2019 was 7.1. Renal function was mildly impaired at 1.49, but this was stable.  He presents for reevaluation.  Past Medical History:  Diagnosis Date  . Chronic back pain   . CML  (chronic myelocytic leukemia) ([place])   . Diabetes mellitus without complication ([place])   . History of stress test    myoview study suggested the possibility of basal inferior wall ischemia which was present in all 3 tonographic views.  [name] Hx of echocardiogram 05/2012   EF 55%Mildly hypertrophic left ventricle with normal systolic function and early signs of mild diastolic dysfuntion. Moderately dilated left atrium, mild aortic valve sclerosis with out stenosis.  . Hyperlipemia   . Hypertension   . Leukemia [facility])     Past Surgical History:  Procedure Laterality Date  . BACK SURGERY  12/2006  . CARDIAC CATHETERIZATION  2010   revealed mild nonobstructive CAD  . CARDIAC CATHETERIZATION  07/2012   This showed essentially normal coronary arteries without obstruction. there was normal LV function.  [name] KNEE SURGERY  01/1994  . LEFT HEART CATHETERIZATION WITH CORONARY ANGIOGRAM N/A 07/29/2012   Procedure: LEFT HEART CATHETERIZATION WITH CORONARY ANGIOGRAM;  Surgeon: Troy Sine, MD;  Location: [facility] CATH LAB;  Service: Cardiovascular;  Laterality: N/A;    Allergies  Allergen Reactions  . Tylenol [Acetaminophen] Other (See Comments)    "when taking gleevec, do not take tylenol"    Current Outpatient Medications  Medication Sig Dispense Refill  . ACCU-CHEK AVIVA PLUS test strip USE TO TEST 3 TIMES DAILY. 100 each 5  . ACCU-CHEK SOFTCLIX LANCETS lancets USE TO TEST 3 TIMES DAILY. 100 each  5  . amLODipine (NORVASC) 5 MG tablet Take 5 mg by mouth daily.    [name] aspirin 81 MG tablet Take 81 mg by mouth daily.    [name] atorvastatin (LIPITOR) 20 MG tablet Take 20 mg by mouth at bedtime.     . B-D ULTRAFINE III SHORT PEN 31G X 8 MM MISC USING WITH TOUJEO 100 each 5  . imatinib (GLEEVEC) 400 MG tablet Take 400 mg by mouth daily. Take with meals and large glass of water.Caution:Chemotherapy.    . metFORMIN (GLUCOPHAGE) 500 MG tablet TAKE (1) TABLET BY MOUTH TWICE DAILY WITH MEALS. 60 tablet 3  . Multiple  Vitamin (MULTIVITAMIN WITH MINERALS) TABS Take 1 tablet by mouth daily.    . nebivolol (BYSTOLIC) 5 MG tablet Take 5 mg by mouth daily.    . Oxycodone HCl 10 MG TABS Take 10 mg by mouth as needed.     . polyethylene glycol-electrolytes (TRILYTE) 420 g solution Take 4,000 mLs by mouth as directed. 4000 mL 0  . ramipril (ALTACE) 5 MG capsule TAKE (1) CAPSULE BY MOUTH ONCE DAILY. 30 capsule 0  . TRESIBA FLEXTOUCH 100 UNIT/ML SOPN FlexTouch Pen INJECT 30 UNITS INTO THE SKIN AT BEDTIME. 15 mL 2   No current facility-administered medications for this visit.     Social History   Socioeconomic History  . Marital status: Married    Spouse name: Not on file  . Number of children: Not on file  . Years of education: Not on file  . Highest education level: Not on file  Occupational History  . Not on file  Social Needs  . Financial resource strain: Not on file  . Food insecurity:    Worry: Not on file    Inability: Not on file  . Transportation needs:    Medical: Not on file    Non-medical: Not on file  Tobacco Use  . Smoking status: Never Smoker  . Smokeless tobacco: Never Used  Substance and Sexual Activity  . Alcohol use: Yes  . Drug use: No  . Sexual activity: Not on file  Lifestyle  . Physical activity:    Days per week: Not on file    Minutes per session: Not on file  . Stress: Not on file  Relationships  . Social connections:    Talks on phone: Not on file    Gets together: Not on file    Attends religious service: Not on file    Active member of club or organization: Not on file    Attends meetings of clubs or organizations: Not on file    Relationship status: Not on file  . Intimate partner violence:    Fear of current or ex partner: Not on file    Emotionally abused: Not on file    Physically abused: Not on file    Forced sexual activity: Not on file  Other Topics Concern  . Not on file  Social History Narrative  . Not on file    Family History  Problem  Relation Age of Onset  . Cancer Mother   . Cancer Father    Social history is notable in that he is married. He has 2 children. There is no tobacco use. He quit at age 77. He does not routinely exercise. He does note some fatigue. He rarely drinks alcohol.  ROS General: Negative; No fevers, chills, or night sweats;  HEENT: Negative; No changes in vision or hearing, sinus congestion, difficulty swallowing Pulmonary: Negative; No  cough, wheezing, shortness of breath, hemoptysis Cardiovascular: Negative; No chest pain, presyncope, syncope, palpitations GI: Negative; No nausea, vomiting, diarrhea, or abdominal pain GU: Mild erectile dysfunction Musculoskeletal: Negative; no myalgias, joint pain, or weakness Hematologic/Oncology: Positive for CML Endocrine: Positive for type 2 diabetes mellitus Neuro: Negative; no changes in balance, headaches Skin: Negative; No rashes or skin lesions Psychiatric: Negative; No behavioral problems, depression Sleep: Negative; No snoring, daytime sleepiness, hypersomnolence, bruxism, restless legs, hypnogognic hallucinations, no cataplexy Other comprehensive 14 point system review is negative.  PE BP 134/82 (BP Location: Left Arm, Patient Position: Sitting, Cuff Size: Large)   Pulse 68   Ht '6\' 1"'  (1.854 m)   Wt 247 lb (112 kg)   BMI 32.59 kg/m    Repeat blood pressure by me was 132/78  Wt Readings from Last 3 Encounters:  03/14/18 247 lb (112 kg)  02/04/18 244 lb (110.7 kg)  10/07/17 240 lb (108.9 kg)   General: Alert, oriented, no distress.  Skin: normal turgor, no rashes, warm and dry HEENT: Normocephalic, atraumatic. Pupils equal round and reactive to light; sclera anicteric; extraocular muscles intact;  Nose without nasal septal hypertrophy Mouth/Parynx benign; Mallinpatti scale 2 Neck: No JVD, no carotid bruits; normal carotid upstroke Lungs: clear to ausculatation and percussion; no wheezing or rales Chest wall: without tenderness to  palpitation Heart: PMI not displaced, RRR, s1 s2 normal, 1/6 systolic murmur, no diastolic murmur, no rubs, gallops, thrills, or heaves Abdomen: mild central adiposity; soft, nontender; no hepatosplenomehaly, BS+; abdominal aorta nontender and not dilated by palpation. Back: no CVA tenderness Pulses 2+, no bruits Musculoskeletal: full range of motion, normal strength, no joint deformities Extremities: no clubbing cyanosis or edema, Homan's sign negative  Neurologic: grossly nonfocal; Cranial nerves grossly wnl Psychologic: Normal mood and affect   ECG (independently read by me): normal sinus rhythm at 68 bpm.  LVH by voltage.  November 2017 ECG (independently read by me): Normal sinus rhythm at 65 bpm.  LVH by voltage.  Normal intervals.  T-wave abnormality in lead 3.  November 2016 ECG (independently read by me): Normal sinus rhythm at 66.  LVH by voltage criteria.  October 2015 ECG (independently read by me): Sinus rhythm at 76 beats per minute.  LVH by voltage criteria in lead aVL  08/2013 ECG: Sinus rhythm. Moderate LVH by voltage criteria. Early repolarization in V2  LABS:  BMP Latest Ref Rng & Units 01/28/2018 10/01/2017 05/27/2017  Glucose 65 - 139 mg/dL 151(H) 166(H) 139(H)  BUN 7 - 25 mg/dL '18 19 15  ' Creatinine 0.70 - 1.25 mg/dL 1.49(H) 1.31(H) 1.31(H)  BUN/Creat Ratio 6 - 22 (calc) 12 15 -  Sodium 135 - 146 mmol/L 138 137 137  Potassium 3.5 - 5.3 mmol/L 4.4 4.6 4.7  Chloride 98 - 110 mmol/L 106 105 104  CO2 20 - 32 mmol/L '26 25 26  ' Calcium 8.6 - 10.3 mg/dL 9.1 9.5 9.2   Hepatic Function Latest Ref Rng & Units 01/28/2018 05/27/2017 02/06/2017  Total Protein 6.1 - 8.1 g/dL 6.0(L) 6.5 6.2  Albumin 3.6 - 5.1 g/dL - 4.1 3.8  AST 10 - 35 U/L '15 17 20  ' ALT 9 - 46 U/L '16 17 18  ' Alk Phosphatase 40 - 115 U/L - 43 52  Total Bilirubin 0.2 - 1.2 mg/dL 0.3 0.4 0.3   CBC Latest Ref Rng & Units 05/02/2009 05/01/2009 04/30/2009  WBC 4.0 - 10.5 K/uL 7.3 7.5 9.9  Hemoglobin 13.0 - 17.0 g/dL 13.3  12.7(L) 14.6  Hematocrit 39.0 -  52.0 % 39.1 36.8(L) 43.8  Platelets 150 - 400 K/uL 236 216 276   Lab Results  Component Value Date   MCV 92.1 05/02/2009   MCV 91.3 05/01/2009   MCV 92.0 04/30/2009            05/01/2009   Lab Results  Component Value Date   HGBA1C 7.1 (H) 01/28/2018  Lipid Panel  No results found for: CHOL, TRIG, HDL, CHOLHDL, VLDL, LDLCALC, LDLDIRECT   RADIOLOGY: No results found.  IMPRESSION: 1. Essential hypertension   2. Mixed hyperlipidemia   3. CML (chronic myelocytic leukemia) ([place])   4. Renal insufficiency   5. Mild obesity     ASSESSMENT AND PLAN: Mr. Dago Jungwirth is a 62 year old African-American gentleman gentleman who has a history of hypertension with documented mild left ventricular hypertrophy on echocardiography and also by ECG criteria.  .  He has a history of mild renal insufficiency which has been stable as well as CML, which has been in remission.  Since I last saw him, his amlodipine dose was reduced from 10 mg down to 5 mg by Dr. Hilma Favors.  His blood pressure today is upper normal.  On his regimen consisting of amlodipine 5 mg, Bystolic 5 mg, and ramipril 5 mg.  Most recent creatinine was 1.49 which is consistent with stage III chronic kidney disease with an estimated GFR 57.  He has hyperlipidemia and continues to be on atorvastatin 20 mg with target LDL is less than 70.  Laboratory in October 2018 showed an LDL at 64.  He has previous documentation of normal coronary arteries.  He has not had exertional chest pain symptomatology.  Since I last saw him, he has gained 15 pounds.  He has not been exercising.  I discussed the American Heart Association recommendations exercising at least 5 days per week for minimum of 30 minutes.  He is diabetic on metformin in addition to insulin.  We discussed weight loss.  He denies issues with sleep.  As long as he remains stable, I will see him in one year for reevaluation.   Time spent: 25 minutes Troy Sine, MD, [facility]  03/14/2018 4:03 PM

## 2018-03-29 ENCOUNTER — Other Ambulatory Visit: Payer: Self-pay | Admitting: Cardiovascular Disease

## 2018-03-31 NOTE — Telephone Encounter (Signed)
refill 

## 2018-04-09 ENCOUNTER — Ambulatory Visit (HOSPITAL_COMMUNITY): Admit: 2018-04-09 | Payer: 59 | Admitting: Internal Medicine

## 2018-04-09 ENCOUNTER — Encounter (HOSPITAL_COMMUNITY): Payer: Self-pay

## 2018-04-09 SURGERY — COLONOSCOPY
Anesthesia: Moderate Sedation

## 2018-04-23 DIAGNOSIS — N183 Chronic kidney disease, stage 3 (moderate): Secondary | ICD-10-CM | POA: Diagnosis not present

## 2018-04-23 DIAGNOSIS — I10 Essential (primary) hypertension: Secondary | ICD-10-CM | POA: Diagnosis not present

## 2018-04-23 DIAGNOSIS — D509 Iron deficiency anemia, unspecified: Secondary | ICD-10-CM | POA: Diagnosis not present

## 2018-04-28 ENCOUNTER — Ambulatory Visit (INDEPENDENT_AMBULATORY_CARE_PROVIDER_SITE_OTHER): Payer: 59 | Admitting: Nurse Practitioner

## 2018-04-28 ENCOUNTER — Encounter: Payer: Self-pay | Admitting: Nurse Practitioner

## 2018-04-28 ENCOUNTER — Other Ambulatory Visit: Payer: Self-pay | Admitting: Cardiovascular Disease

## 2018-04-28 DIAGNOSIS — R69 Illness, unspecified: Secondary | ICD-10-CM | POA: Insufficient documentation

## 2018-04-28 DIAGNOSIS — Z800 Family history of malignant neoplasm of digestive organs: Secondary | ICD-10-CM | POA: Insufficient documentation

## 2018-04-28 NOTE — Assessment & Plan Note (Addendum)
Noted family history of colon cancer in his mother who was diagnosed in her early 50s.  No other known primary relatives with colorectal cancer.  This will likely change his interval follow-up, depending on colonoscopy findings.  We will proceed with colonoscopy at this time.  His last exam was 10 years ago and found tubulovillous adenoma.  No timeframe found for recommended repeat, however the age of the exam and change in the EMR system since then makes this difficult.  Recommended follow-up based on post procedure recommendations and as needed for any GI issues.  Proceed with TCS on propofol/MAC with Dr. Gala Romney in near future: the risks, benefits, and alternatives have been discussed with the patient in detail. The patient states understanding and desires to proceed.  She is currently on oxycodone for chronic back pain.  No other anticoagulants, anxiolytics, chronic pain medications, or antidepressants.  We will plan for the procedure on propofol/MAC to promote adequate sedation.

## 2018-04-28 NOTE — Assessment & Plan Note (Signed)
The patient is currently on multiple medications including insulin, diabetes medication, chronic pain medication.  We will plan for his colonoscopy on propofol/MAC due to chronic pain meds.  Will make adjustments to his diabetes medications to account for prep and n.p.o. after midnight.

## 2018-04-28 NOTE — Patient Instructions (Signed)
1. We will schedule your colonoscopy for you. 2. Further recommendations will be made after your procedure. 3. Return for follow-up based on recommendations made after your procedure. 4. Alternatively, you can call us if you have any GI concerns for which we could be of assistance 5. Call us if you have any questions or concerns.  At [facility] we value your feedback. You may receive a survey about your visit today. Please share your experience as we strive to create trusting relationships with our patients to provide genuine, compassionate, quality care.  It was great to meet you today!  I hope you have a great summer!!

## 2018-04-28 NOTE — Progress Notes (Signed)
Primary Care Physician:  Sharilyn Sites, MD Primary Gastroenterologist:  Dr. Gala Romney  Chief Complaint  Patient presents with  . Colonoscopy    consult    HPI:   Dylan Spears is a 62 y.o. male who presents to schedule colonoscopy.  Phone/nurse triage was deferred to office visit due to polypharmacy and personal histories.  He was noted to be on oxycodone.  Family history of colon cancer in his mother at age 84.  Diagnosed with chronic myeloid leukemia in 2006.  Followed by oncology at [facility], last office visit 12/19/2017.  At that time no significant interval history and no complaints other than chronic fatigue.  Noted CML in long-standing molecular remission, CBC/CMP stable.  They indicated he could discontinue his daily chemotherapy but the patient elected to stay on medication.  Colonoscopy completed 06/25/2008 which found normal rectum, left-sided diverticula, pedunculated mid descending colon polyp status post removal.  Surgical pathology found the polyp to be tubulovillous adenoma. Due to the age of the colonoscopy, no recommendation for follow-up found. Letter was mailed to patient 2019. Likely not know about family history at that time.  Today he states he's doing well overall. Still with some fatigue, he thinks is related to CML. Denies abdominal pain, N/V, hematochezia, melena, fever, chills, unintentional weight loss. Denies chest pain, dyspnea, dizziness, lightheadedness, syncope, near syncope. Denies any other upper or lower GI symptoms.  His mother did have colon CA.  Past Medical History:  Diagnosis Date  . Chronic back pain   . CML (chronic myelocytic leukemia) ([place])   . Diabetes mellitus without complication ([place])   . History of stress test    myoview study suggested the possibility of basal inferior wall ischemia which was present in all 3 tonographic views.  [name] Hx of echocardiogram 05/2012   EF 55%Mildly hypertrophic left  ventricle with normal systolic function and early signs of mild diastolic dysfuntion. Moderately dilated left atrium, mild aortic valve sclerosis with out stenosis.  . Hyperlipemia   . Hypertension   . Leukemia [facility])     Past Surgical History:  Procedure Laterality Date  . BACK SURGERY  12/2006  . CARDIAC CATHETERIZATION  2010   revealed mild nonobstructive CAD  . CARDIAC CATHETERIZATION  07/2012   This showed essentially normal coronary arteries without obstruction. there was normal LV function.  [name] KNEE SURGERY  01/1994  . LEFT HEART CATHETERIZATION WITH CORONARY ANGIOGRAM N/A 07/29/2012   Procedure: LEFT HEART CATHETERIZATION WITH CORONARY ANGIOGRAM;  Surgeon: Troy Sine, MD;  Location: [facility] CATH LAB;  Service: Cardiovascular;  Laterality: N/A;    Current Outpatient Medications  Medication Sig Dispense Refill  . ACCU-CHEK AVIVA PLUS test strip USE TO TEST 3 TIMES DAILY. 100 each 5  . ACCU-CHEK SOFTCLIX LANCETS lancets USE TO TEST 3 TIMES DAILY. 100 each 5  . amLODipine (NORVASC) 5 MG tablet Take 5 mg by mouth daily.    [name] aspirin 81 MG tablet Take 81 mg by mouth daily.    [name] atorvastatin (LIPITOR) 20 MG tablet Take 20 mg by mouth at bedtime.     . B-D ULTRAFINE III SHORT PEN 31G X 8 MM MISC USING WITH TOUJEO 100 each 5  . BYSTOLIC 10 MG tablet TAKE ONE TABLET BY MOUTH EVERY DAY. 30 tablet 11  . imatinib (GLEEVEC) 400 MG tablet Take 400 mg by mouth daily. Take with meals and large glass of water.Caution:Chemotherapy.    . metFORMIN (GLUCOPHAGE) 500 MG  tablet TAKE (1) TABLET BY MOUTH TWICE DAILY WITH MEALS. 60 tablet 3  . Multiple Vitamin (MULTIVITAMIN WITH MINERALS) TABS Take 1 tablet by mouth daily.    . Oxycodone HCl 10 MG TABS Take 10 mg by mouth as needed.     . ramipril (ALTACE) 5 MG capsule TAKE (1) CAPSULE BY MOUTH ONCE DAILY. 30 capsule 11  . TRESIBA FLEXTOUCH 100 UNIT/ML SOPN FlexTouch Pen INJECT 30 UNITS INTO THE SKIN AT BEDTIME. (Patient taking differently: INJECT 30 UNITS  INTO THE SKIN AT BEDTIME. Takes 34 units at bedtime.) 15 mL 2  . polyethylene glycol-electrolytes (TRILYTE) 420 g solution Take 4,000 mLs by mouth as directed. (Patient not taking: Reported on 04/28/2018) 4000 mL 0   No current facility-administered medications for this visit.     Allergies as of 04/28/2018 - Review Complete 04/28/2018  Allergen Reaction Noted  . Tylenol [acetaminophen] Other (See Comments) 07/24/2012    Family History  Problem Relation Age of Onset  . Cancer Mother   . Cancer Father     Social History   Socioeconomic History  . Marital status: Married    Spouse name: Not on file  . Number of children: Not on file  . Years of education: Not on file  . Highest education level: Not on file  Occupational History  . Not on file  Social Needs  . Financial resource strain: Not on file  . Food insecurity:    Worry: Not on file    Inability: Not on file  . Transportation needs:    Medical: Not on file    Non-medical: Not on file  Tobacco Use  . Smoking status: Former Research scientist (life sciences)  . Smokeless tobacco: Never Used  . Tobacco comment: "30 years ago"  Substance and Sexual Activity  . Alcohol use: Yes    Comment: "very seldom"  . Drug use: No  . Sexual activity: Not on file  Lifestyle  . Physical activity:    Days per week: Not on file    Minutes per session: Not on file  . Stress: Not on file  Relationships  . Social connections:    Talks on phone: Not on file    Gets together: Not on file    Attends religious service: Not on file    Active member of club or organization: Not on file    Attends meetings of clubs or organizations: Not on file    Relationship status: Not on file  . Intimate partner violence:    Fear of current or ex partner: Not on file    Emotionally abused: Not on file    Physically abused: Not on file    Forced sexual activity: Not on file  Other Topics Concern  . Not on file  Social History Narrative  . Not on file    Review of  Systems: General: Negative for anorexia, weight loss, fever, chills. ENT: Negative for hoarseness, difficulty swallowing. CV: Negative for chest pain, angina, palpitations, peripheral edema.  Respiratory: Negative for dyspnea at rest, cough, sputum, wheezing.  GI: See history of present illness. MS: Admits chronic back pain.  Derm: Negative for rash or itching.  Endo: Negative for unusual weight change.  Heme: Negative for bruising or bleeding. Allergy: Negative for rash or hives.    Physical Exam: BP (!) 153/87   Pulse 69   Temp (!) 97.2 F (36.2 C) (Oral)   Ht 6\' 1"  (1.854 m)   Wt 244 lb (110.7 kg)   BMI  32.19 kg/m  General:   Alert and oriented. Pleasant and cooperative. Well-nourished and well-developed.  Head:  Normocephalic and atraumatic. Eyes:  Without icterus, sclera clear and conjunctiva pink.  Ears:  Normal auditory acuity. Cardiovascular:  S1, S2 present without murmurs appreciated. Extremities without clubbing or edema. Respiratory:  Clear to auscultation bilaterally. No wheezes, rales, or rhonchi. No distress.  Gastrointestinal:  +BS, soft, non-tender and non-distended. No HSM noted. No guarding or rebound. No masses appreciated.  Rectal:  Deferred  Musculoskalatal:  Symmetrical without gross deformities. Neurologic:  Alert and oriented x4;  grossly normal neurologically. Psych:  Alert and cooperative. Normal mood and affect. Heme/Lymph/Immune: No excessive bruising noted.    04/28/2018 9:38 AM   Disclaimer: This note was dictated with voice recognition software. Similar sounding words can inadvertently be transcribed and may not be corrected upon review.

## 2018-04-29 ENCOUNTER — Telehealth: Payer: Self-pay | Admitting: Internal Medicine

## 2018-04-29 ENCOUNTER — Other Ambulatory Visit: Payer: Self-pay | Admitting: *Deleted

## 2018-04-29 DIAGNOSIS — Z86010 Personal history of colonic polyps: Secondary | ICD-10-CM

## 2018-04-29 DIAGNOSIS — Z800 Family history of malignant neoplasm of digestive organs: Secondary | ICD-10-CM

## 2018-04-29 NOTE — Telephone Encounter (Signed)
Pre-op scheduled for 07/07/18 at 11:00am. Letter mailed with prep instructions

## 2018-04-29 NOTE — Telephone Encounter (Signed)
Pt was seen yesterday and called today to schedule his procedure. Please call him on his cell [phone]

## 2018-04-29 NOTE — Progress Notes (Signed)
cc'd to pcp 

## 2018-04-29 NOTE — Telephone Encounter (Signed)
Called spoke with patient and he is scheduled for 07/14/18 at 10:45am for TCS w/ MAC W/ RMR. Patient states he already has prep at home. I have mailed new patient instructions to him. Aware will call back with pre-op appointment.    PA was approved via [facility] website Auth # [id number]

## 2018-04-30 DIAGNOSIS — R809 Proteinuria, unspecified: Secondary | ICD-10-CM | POA: Diagnosis not present

## 2018-04-30 DIAGNOSIS — N183 Chronic kidney disease, stage 3 (moderate): Secondary | ICD-10-CM | POA: Diagnosis not present

## 2018-04-30 DIAGNOSIS — D638 Anemia in other chronic diseases classified elsewhere: Secondary | ICD-10-CM | POA: Diagnosis not present

## 2018-05-19 DIAGNOSIS — Z0001 Encounter for general adult medical examination with abnormal findings: Secondary | ICD-10-CM | POA: Diagnosis not present

## 2018-05-19 DIAGNOSIS — Z6833 Body mass index (BMI) 33.0-33.9, adult: Secondary | ICD-10-CM | POA: Diagnosis not present

## 2018-05-19 DIAGNOSIS — N183 Chronic kidney disease, stage 3 (moderate): Secondary | ICD-10-CM | POA: Diagnosis not present

## 2018-05-19 DIAGNOSIS — E1129 Type 2 diabetes mellitus with other diabetic kidney complication: Secondary | ICD-10-CM | POA: Diagnosis not present

## 2018-05-19 DIAGNOSIS — E785 Hyperlipidemia, unspecified: Secondary | ICD-10-CM | POA: Diagnosis not present

## 2018-05-19 DIAGNOSIS — I10 Essential (primary) hypertension: Secondary | ICD-10-CM | POA: Diagnosis not present

## 2018-05-19 DIAGNOSIS — C9210 Chronic myeloid leukemia, BCR/ABL-positive, not having achieved remission: Secondary | ICD-10-CM | POA: Diagnosis not present

## 2018-06-05 DIAGNOSIS — H2513 Age-related nuclear cataract, bilateral: Secondary | ICD-10-CM | POA: Diagnosis not present

## 2018-06-05 DIAGNOSIS — Z794 Long term (current) use of insulin: Secondary | ICD-10-CM | POA: Diagnosis not present

## 2018-06-05 DIAGNOSIS — E119 Type 2 diabetes mellitus without complications: Secondary | ICD-10-CM | POA: Diagnosis not present

## 2018-06-05 DIAGNOSIS — E1129 Type 2 diabetes mellitus with other diabetic kidney complication: Secondary | ICD-10-CM | POA: Diagnosis not present

## 2018-06-09 DIAGNOSIS — N183 Chronic kidney disease, stage 3 (moderate): Secondary | ICD-10-CM | POA: Diagnosis not present

## 2018-06-09 DIAGNOSIS — Z79899 Other long term (current) drug therapy: Secondary | ICD-10-CM | POA: Diagnosis not present

## 2018-06-09 DIAGNOSIS — I10 Essential (primary) hypertension: Secondary | ICD-10-CM | POA: Diagnosis not present

## 2018-06-12 DIAGNOSIS — M62838 Other muscle spasm: Secondary | ICD-10-CM | POA: Diagnosis not present

## 2018-06-12 DIAGNOSIS — C9211 Chronic myeloid leukemia, BCR/ABL-positive, in remission: Secondary | ICD-10-CM | POA: Diagnosis not present

## 2018-06-16 DIAGNOSIS — C9211 Chronic myeloid leukemia, BCR/ABL-positive, in remission: Secondary | ICD-10-CM | POA: Diagnosis not present

## 2018-06-30 ENCOUNTER — Other Ambulatory Visit: Payer: Self-pay | Admitting: Cardiovascular Disease

## 2018-07-02 NOTE — Patient Instructions (Signed)
Dylan Spears.  07/02/2018     @PREFPERIOPPHARMACY @   Your procedure is scheduled on  07/14/2018 .  Report to [facility] at  915   A.M.  Call this number if you have problems the morning of surgery:  [phone]   Remember:  Do not eat or drink after midnight.  You may drink clear liquids until ( follow the instructions given to you) .  Clear liquids allowed are:                    Water, Juice (non-citric and without pulp), Carbonated beverages, Clear Tea, Black Coffee only, Plain Jell-O only, Gatorade and Plain Popsicles only    Take these medicines the morning of surgery with A SIP OF WATER  Amlodipine, bystolic, oxycodone (if needed), altace. Take 1/2 of your night time insulin (take 17 units). DO NOT take any medications for diabetes the morning of your procedure.    Do not wear jewelry, make-up or nail polish.  Do not wear lotions, powders, or perfumes, or deodorant.  Do not shave 48 hours prior to surgery.  Men may shave face and neck.  Do not bring valuables to the hospital.  [facility] is not responsible for any belongings or valuables.  Contacts, dentures or bridgework may not be worn into surgery.  Leave your suitcase in the car.  After surgery it may be brought to your room.  For patients admitted to the hospital, discharge time will be determined by your treatment team.  Patients discharged the day of surgery will not be allowed to drive home.   Name and phone number of your driver:   family Special instructions:  Follow the diet and prep instructions given to you by Dr Roseanne Kaufman office.  Please read over the following fact sheets that you were given. Anesthesia Post-op Instructions and Care and Recovery After Surgery       Colonoscopy, Adult A colonoscopy is an exam to look at the large intestine. It is done to check for problems, such as:  Lumps (tumors).  Growths (polyps).  Swelling (inflammation).  Bleeding.  What happens before  the procedure? Eating and drinking Follow instructions from your doctor about eating and drinking. These instructions may include:  A few days before the procedure - follow a low-fiber diet. ? Avoid nuts. ? Avoid seeds. ? Avoid dried fruit. ? Avoid raw fruits. ? Avoid vegetables.  1-3 days before the procedure - follow a clear liquid diet. Avoid liquids that have red or purple dye. Drink only clear liquids, such as: ? Clear broth or bouillon. ? Black coffee or tea. ? Clear juice. ? Clear soft drinks or sports drinks. ? Gelatin dessert. ? Popsicles.  On the day of the procedure - do not eat or drink anything during the 2 hours before the procedure.  Bowel prep If you were prescribed an oral bowel prep:  Take it as told by your doctor. Starting the day before your procedure, you will need to drink a lot of liquid. The liquid will cause you to poop (have bowel movements) until your poop is almost clear or light green.  If your skin or butt gets irritated from diarrhea, you may: ? Wipe the area with wipes that have medicine in them, such as adult wet wipes with aloe and vitamin E. ? Put something on your skin that soothes the area, such as petroleum jelly.  If you throw  up (vomit) while drinking the bowel prep, take a break for up to 60 minutes. Then begin the bowel prep again. If you keep throwing up and you cannot take the bowel prep without throwing up, call your doctor.  General instructions  Ask your doctor about changing or stopping your normal medicines. This is important if you take diabetes medicines or blood thinners.  Plan to have someone take you home from the hospital or clinic. What happens during the procedure?  An IV tube may be put into one of your veins.  You will be given medicine to help you relax (sedative).  To reduce your risk of infection: ? Your doctors will wash their hands. ? Your anal area will be washed with soap.  You will be asked to lie on  your side with your knees bent.  Your doctor will get a long, thin, flexible tube ready. The tube will have a camera and a light on the end.  The tube will be put into your anus.  The tube will be gently put into your large intestine.  Air will be delivered into your large intestine to keep it open. You may feel some pressure or cramping.  The camera will be used to take photos.  A small tissue sample may be removed from your body to be looked at under a microscope (biopsy). If any possible problems are found, the tissue will be sent to a lab for testing.  If small growths are found, your doctor may remove them and have them checked for cancer.  The tube that was put into your anus will be slowly removed. The procedure may vary among doctors and hospitals. What happens after the procedure?  Your doctor will check on you often until the medicines you were given have worn off.  Do not drive for 24 hours after the procedure.  You may have a small amount of blood in your poop.  You may pass gas.  You may have mild cramps or bloating in your belly (abdomen).  It is up to you to get the results of your procedure. Ask your doctor, or the department performing the procedure, when your results will be ready. This information is not intended to replace advice given to you by your health care provider. Make sure you discuss any questions you have with your health care provider. Document Released: 12/15/2010 Document Revised: 09/12/2016 Document Reviewed: 01/24/2016 Elsevier Interactive Patient Education  2017 [employer]  Colonoscopy, Adult, Care After This sheet gives you information about how to care for yourself after your procedure. Your health care provider may also give you more specific instructions. If you have problems or questions, contact your health care provider. What can I expect after the procedure? After the procedure, it is common to have:  A small amount of blood in  your stool for 24 hours after the procedure.  Some gas.  Mild abdominal cramping or bloating.  Follow these instructions at home: General instructions   For the first 24 hours after the procedure: ? Do not drive or use machinery. ? Do not sign important documents. ? Do not drink alcohol. ? Do your regular daily activities at a slower pace than normal. ? Eat soft, easy-to-digest foods. ? Rest often.  Take over-the-counter or prescription medicines only as told by your health care provider.  It is up to you to get the results of your procedure. Ask your health care provider, or the department performing the procedure, when your  results will be ready. Relieving cramping and bloating  Try walking around when you have cramps or feel bloated.  Apply heat to your abdomen as told by your health care provider. Use a heat source that your health care provider recommends, such as a moist heat pack or a heating pad. ? Place a towel between your skin and the heat source. ? Leave the heat on for 20-30 minutes. ? Remove the heat if your skin turns bright red. This is especially important if you are unable to feel pain, heat, or cold. You may have a greater risk of getting burned. Eating and drinking  Drink enough fluid to keep your urine clear or pale yellow.  Resume your normal diet as instructed by your health care provider. Avoid heavy or fried foods that are hard to digest.  Avoid drinking alcohol for as long as instructed by your health care provider. Contact a health care provider if:  You have blood in your stool 2-3 days after the procedure. Get help right away if:  You have more than a small spotting of blood in your stool.  You pass large blood clots in your stool.  Your abdomen is swollen.  You have nausea or vomiting.  You have a fever.  You have increasing abdominal pain that is not relieved with medicine. This information is not intended to replace advice given to  you by your health care provider. Make sure you discuss any questions you have with your health care provider. Document Released: 06/26/2004 Document Revised: 08/06/2016 Document Reviewed: 01/24/2016 Elsevier Interactive Patient Education  2018 [place] Anesthesia is a term that refers to techniques, procedures, and medicines that help a person stay safe and comfortable during a medical procedure. Monitored anesthesia care, or sedation, is one type of anesthesia. Your anesthesia specialist may recommend sedation if you will be having a procedure that does not require you to be unconscious, such as:  Cataract surgery.  A dental procedure.  A biopsy.  A colonoscopy.  During the procedure, you may receive a medicine to help you relax (sedative). There are three levels of sedation:  Mild sedation. At this level, you may feel awake and relaxed. You will be able to follow directions.  Moderate sedation. At this level, you will be sleepy. You may not remember the procedure.  Deep sedation. At this level, you will be asleep. You will not remember the procedure.  The more medicine you are given, the deeper your level of sedation will be. Depending on how you respond to the procedure, the anesthesia specialist may change your level of sedation or the type of anesthesia to fit your needs. An anesthesia specialist will monitor you closely during the procedure. Let your health care provider know about:  Any allergies you have.  All medicines you are taking, including vitamins, herbs, eye drops, creams, and over-the-counter medicines.  Any use of steroids (by mouth or as a cream).  Any problems you or family members have had with sedatives and anesthetic medicines.  Any blood disorders you have.  Any surgeries you have had.  Any medical conditions you have, such as sleep apnea.  Whether you are pregnant or may be pregnant.  Any use of cigarettes, alcohol,  or street drugs. What are the risks? Generally, this is a safe procedure. However, problems may occur, including:  Getting too much medicine (oversedation).  Nausea.  Allergic reaction to medicines.  Trouble breathing. If this happens, a breathing tube  may be used to help with breathing. It will be removed when you are awake and breathing on your own.  Heart trouble.  Lung trouble.  Before the procedure Staying hydrated Follow instructions from your health care provider about hydration, which may include:  Up to 2 hours before the procedure - you may continue to drink clear liquids, such as water, clear fruit juice, black coffee, and plain tea.  Eating and drinking restrictions Follow instructions from your health care provider about eating and drinking, which may include:  8 hours before the procedure - stop eating heavy meals or foods such as meat, fried foods, or fatty foods.  6 hours before the procedure - stop eating light meals or foods, such as toast or cereal.  6 hours before the procedure - stop drinking milk or drinks that contain milk.  2 hours before the procedure - stop drinking clear liquids.  Medicines Ask your health care provider about:  Changing or stopping your regular medicines. This is especially important if you are taking diabetes medicines or blood thinners.  Taking medicines such as aspirin and ibuprofen. These medicines can thin your blood. Do not take these medicines before your procedure if your health care provider instructs you not to.  Tests and exams  You will have a physical exam.  You may have blood tests done to show: ? How well your kidneys and liver are working. ? How well your blood can clot.  General instructions  Plan to have someone take you home from the hospital or clinic.  If you will be going home right after the procedure, plan to have someone with you for 24 hours.  What happens during the procedure?  Your blood  pressure, heart rate, breathing, level of pain and overall condition will be monitored.  An IV tube will be inserted into one of your veins.  Your anesthesia specialist will give you medicines as needed to keep you comfortable during the procedure. This may mean changing the level of sedation.  The procedure will be performed. After the procedure  Your blood pressure, heart rate, breathing rate, and blood oxygen level will be monitored until the medicines you were given have worn off.  Do not drive for 24 hours if you received a sedative.  You may: ? Feel sleepy, clumsy, or nauseous. ? Feel forgetful about what happened after the procedure. ? Have a sore throat if you had a breathing tube during the procedure. ? Vomit. This information is not intended to replace advice given to you by your health care provider. Make sure you discuss any questions you have with your health care provider. Document Released: 08/08/2005 Document Revised: 04/20/2016 Document Reviewed: 03/04/2016 Elsevier Interactive Patient Education  2018 [place], Care After These instructions provide you with information about caring for yourself after your procedure. Your health care provider may also give you more specific instructions. Your treatment has been planned according to current medical practices, but problems sometimes occur. Call your health care provider if you have any problems or questions after your procedure. What can I expect after the procedure? After your procedure, it is common to:  Feel sleepy for several hours.  Feel clumsy and have poor balance for several hours.  Feel forgetful about what happened after the procedure.  Have poor judgment for several hours.  Feel nauseous or vomit.  Have a sore throat if you had a breathing tube during the procedure.  Follow these instructions at home:  For at least 24 hours after the procedure:   Do not: ? Participate  in activities in which you could fall or become injured. ? Drive. ? Use heavy machinery. ? Drink alcohol. ? Take sleeping pills or medicines that cause drowsiness. ? Make important decisions or sign legal documents. ? Take care of children on your own.  Rest. Eating and drinking  Follow the diet that is recommended by your health care provider.  If you vomit, drink water, juice, or soup when you can drink without vomiting.  Make sure you have little or no nausea before eating solid foods. General instructions  Have a responsible adult stay with you until you are awake and alert.  Take over-the-counter and prescription medicines only as told by your health care provider.  If you smoke, do not smoke without supervision.  Keep all follow-up visits as told by your health care provider. This is important. Contact a health care provider if:  You keep feeling nauseous or you keep vomiting.  You feel light-headed.  You develop a rash.  You have a fever. Get help right away if:  You have trouble breathing. This information is not intended to replace advice given to you by your health care provider. Make sure you discuss any questions you have with your health care provider. Document Released: 03/04/2016 Document Revised: 07/04/2016 Document Reviewed: 03/04/2016 Elsevier Interactive Patient Education  [employer].

## 2018-07-03 ENCOUNTER — Telehealth: Payer: Self-pay | Admitting: "Endocrinology

## 2018-07-03 NOTE — Telephone Encounter (Signed)
Dylan Spears is stating his blood sugars are running high  Sunday 8/4 220  08/05 227  08/06 174  08/07 182  08/08 283  Please advise of changes that need to be made

## 2018-07-07 ENCOUNTER — Encounter (HOSPITAL_COMMUNITY)
Admission: RE | Admit: 2018-07-07 | Discharge: 2018-07-07 | Disposition: A | Discharge status: Home / Self Care | Payer: 59 | Source: Ambulatory Visit | Attending: Internal Medicine | Admitting: Internal Medicine

## 2018-07-07 ENCOUNTER — Encounter (HOSPITAL_COMMUNITY): Payer: Self-pay

## 2018-07-07 ENCOUNTER — Other Ambulatory Visit: Payer: Self-pay

## 2018-07-07 DIAGNOSIS — Z01812 Encounter for preprocedural laboratory examination: Secondary | ICD-10-CM | POA: Insufficient documentation

## 2018-07-07 LAB — CBC WITH DIFFERENTIAL/PLATELET
Basophils Absolute: 0.1 10*3/uL (ref 0.0–0.1)
Basophils Relative: 1 %
Eosinophils Absolute: 0.1 10*3/uL (ref 0.0–0.7)
Eosinophils Relative: 1 %
HEMATOCRIT: 36.9 % — AB (ref 39.0–52.0)
Hemoglobin: 12.9 g/dL — ABNORMAL LOW (ref 13.0–17.0)
LYMPHS PCT: 20 %
Lymphs Abs: 1.7 10*3/uL (ref 0.7–4.0)
MCH: 30.9 pg (ref 26.0–34.0)
MCHC: 35 g/dL (ref 30.0–36.0)
MCV: 88.3 fL (ref 78.0–100.0)
Monocytes Absolute: 0.9 10*3/uL (ref 0.1–1.0)
Monocytes Relative: 10 %
NEUTROS ABS: 5.8 10*3/uL (ref 1.7–7.7)
Neutrophils Relative %: 68 %
Platelets: 319 10*3/uL (ref 150–400)
RBC: 4.18 MIL/uL — ABNORMAL LOW (ref 4.22–5.81)
RDW: 13 % (ref 11.5–15.5)
WBC: 8.4 10*3/uL (ref 4.0–10.5)

## 2018-07-07 LAB — BASIC METABOLIC PANEL
Anion gap: 7 (ref 5–15)
BUN: 20 mg/dL (ref 8–23)
CHLORIDE: 104 mmol/L (ref 98–111)
CO2: 25 mmol/L (ref 22–32)
Calcium: 9.6 mg/dL (ref 8.9–10.3)
Creatinine, Ser: 1.28 mg/dL — ABNORMAL HIGH (ref 0.61–1.24)
GFR calc Af Amer: >60 mL/min (ref >60)
GFR calc non Af Amer: 58 mL/min — ABNORMAL LOW (ref >60)
Glucose, Bld: 316 mg/dL — ABNORMAL HIGH (ref 70–99)
POTASSIUM: 4.2 mmol/L (ref 3.5–5.1)
Sodium: 136 mmol/L (ref 135–145)

## 2018-07-07 NOTE — Telephone Encounter (Signed)
Advise to increase Tresiba to 40 units nightly.

## 2018-07-07 NOTE — Telephone Encounter (Signed)
Called pt. No answer °

## 2018-07-08 ENCOUNTER — Encounter: Payer: Self-pay | Admitting: "Endocrinology

## 2018-07-08 ENCOUNTER — Ambulatory Visit (INDEPENDENT_AMBULATORY_CARE_PROVIDER_SITE_OTHER): Payer: 59 | Admitting: "Endocrinology

## 2018-07-08 VITALS — BP 148/90 | Ht 73.0 in | Wt 241.0 lb

## 2018-07-08 DIAGNOSIS — N183 Chronic kidney disease, stage 3 (moderate): Secondary | ICD-10-CM | POA: Diagnosis not present

## 2018-07-08 DIAGNOSIS — Z794 Long term (current) use of insulin: Secondary | ICD-10-CM

## 2018-07-08 DIAGNOSIS — E1165 Type 2 diabetes mellitus with hyperglycemia: Secondary | ICD-10-CM | POA: Diagnosis not present

## 2018-07-08 DIAGNOSIS — E1122 Type 2 diabetes mellitus with diabetic chronic kidney disease: Secondary | ICD-10-CM | POA: Diagnosis not present

## 2018-07-08 DIAGNOSIS — IMO0002 Reserved for concepts with insufficient information to code with codable children: Secondary | ICD-10-CM

## 2018-07-08 DIAGNOSIS — I10 Essential (primary) hypertension: Secondary | ICD-10-CM

## 2018-07-08 DIAGNOSIS — E782 Mixed hyperlipidemia: Secondary | ICD-10-CM

## 2018-07-08 MED ORDER — INSULIN DEGLUDEC 100 UNIT/ML ~~LOC~~ SOPN: 50.0000 [IU] | PEN_INJECTOR | Freq: Every day | SUBCUTANEOUS | 2 refills | Status: DC

## 2018-07-08 NOTE — Progress Notes (Signed)
Endocrinology follow-up note  Subjective:    Patient ID: Dylan Spears., male    DOB: 03-08-1956. Patient is being seen in follow-up   for management of diabetes requested by  Sharilyn Sites, MD  Past Medical History:  Diagnosis Date  . Chronic back pain   . CML (chronic myelocytic leukemia) ([place])   . Diabetes mellitus without complication ([place])   . History of stress test    myoview study suggested the possibility of basal inferior wall ischemia which was present in all 3 tonographic views.  [name] Hx of echocardiogram 05/2012   EF 55%Mildly hypertrophic left ventricle with normal systolic function and early signs of mild diastolic dysfuntion. Moderately dilated left atrium, mild aortic valve sclerosis with out stenosis.  . Hyperlipemia   . Hypertension   . Leukemia [facility])    Past Surgical History:  Procedure Laterality Date  . BACK SURGERY  12/2006  . CARDIAC CATHETERIZATION  2010   revealed mild nonobstructive CAD  . CARDIAC CATHETERIZATION  07/2012   This showed essentially normal coronary arteries without obstruction. there was normal LV function.  [name] KNEE SURGERY Left 01/1994  . LEFT HEART CATHETERIZATION WITH CORONARY ANGIOGRAM N/A 07/29/2012   Procedure: LEFT HEART CATHETERIZATION WITH CORONARY ANGIOGRAM;  Surgeon: Troy Sine, MD;  Location: [facility] CATH LAB;  Service: Cardiovascular;  Laterality: N/A;   Social History   Socioeconomic History  . Marital status: Married    Spouse name: Not on file  . Number of children: Not on file  . Years of education: Not on file  . Highest education level: Not on file  Occupational History  . Not on file  Social Needs  . Financial resource strain: Not on file  . Food insecurity:    Worry: Not on file    Inability: Not on file  . Transportation needs:    Medical: Not on file    Non-medical: Not on file  Tobacco Use  . Smoking status: Former Smoker    Packs/day: 0.25    Years: 15.00    Pack years: 3.75    Types: Cigarettes   Last attempt to quit: 07/08/1987    Years since quitting: 31.0  . Smokeless tobacco: Never Used  . Tobacco comment: "30 years ago"  Substance and Sexual Activity  . Alcohol use: Yes    Comment: "very seldom"  . Drug use: No  . Sexual activity: Yes    Birth control/protection: None  Lifestyle  . Physical activity:    Days per week: Not on file    Minutes per session: Not on file  . Stress: Not on file  Relationships  . Social connections:    Talks on phone: Not on file    Gets together: Not on file    Attends religious service: Not on file    Active member of club or organization: Not on file    Attends meetings of clubs or organizations: Not on file    Relationship status: Not on file  Other Topics Concern  . Not on file  Social History Narrative  . Not on file   Outpatient Encounter Medications as of 07/08/2018  Medication Sig  . ACCU-CHEK AVIVA PLUS test strip USE TO TEST 3 TIMES DAILY.  [name] ACCU-CHEK SOFTCLIX LANCETS lancets USE TO TEST 3 TIMES DAILY.  [name] amLODipine (NORVASC) 10 MG tablet TAKE 1 TABLET BY MOUTH ONCE A DAY FOR BLOOD PRESSURE.  [name] aspirin EC 81 MG tablet Take 81 mg by mouth daily.  [name]  atorvastatin (LIPITOR) 20 MG tablet Take 20 mg by mouth at bedtime.   . B-D ULTRAFINE III SHORT PEN 31G X 8 MM MISC USING WITH TOUJEO  . BYSTOLIC 10 MG tablet TAKE ONE TABLET BY MOUTH EVERY DAY. (Patient taking differently: TAKE ONE-HALF TABLET (5 MG) BY MOUTH EVERY DAY.)  . insulin degludec (TRESIBA FLEXTOUCH) 100 UNIT/ML SOPN FlexTouch Pen Inject 0.5 mLs (50 Units total) into the skin at bedtime.  . metFORMIN (GLUCOPHAGE) 500 MG tablet TAKE (1) TABLET BY MOUTH TWICE DAILY WITH MEALS.  . Multiple Vitamin (MULTIVITAMIN WITH MINERALS) TABS Take 1 tablet by mouth daily.  . nebivolol (BYSTOLIC) 5 MG tablet Take 5 mg by mouth daily.  . Oxycodone HCl 10 MG TABS Take 10 mg by mouth daily as needed (for pain.).   [name] polyethylene glycol-electrolytes (TRILYTE) 420 g solution Take 4,000 mLs by  mouth as directed.  . ramipril (ALTACE) 5 MG capsule TAKE (1) CAPSULE BY MOUTH ONCE DAILY.  . [DISCONTINUED] TRESIBA FLEXTOUCH 100 UNIT/ML SOPN FlexTouch Pen INJECT 30 UNITS INTO THE SKIN AT BEDTIME. (Patient taking differently: INJECT 34 UNITS INTO THE SKIN AT BEDTIME.)   No facility-administered encounter medications on file as of 07/08/2018.    ALLERGIES: No Known Allergies VACCINATION STATUS:  There is no immunization history on file for this patient.  Diabetes  He presents for his follow-up diabetic visit. He has type 2 diabetes mellitus. Onset time: He was diagnosed at approximate age of 62 years. His disease course has been worsening. There are no hypoglycemic associated symptoms. Pertinent negatives for hypoglycemia include no confusion, headaches, pallor or seizures. Pertinent negatives for diabetes include no chest pain, no fatigue, no polydipsia, no polyphagia, no polyuria and no weakness. There are no hypoglycemic complications. Symptoms are worsening. Diabetic complications include nephropathy. Risk factors for coronary artery disease include diabetes mellitus, dyslipidemia, hypertension, male sex, sedentary lifestyle and tobacco exposure. Current diabetic treatment includes oral agent (dual therapy) and insulin injections. His weight is fluctuating minimally. He is following a generally unhealthy diet. When asked about meal planning, he reported none. He has not had a previous visit with a dietitian. He never participates in exercise. His breakfast blood glucose range is generally >200 mg/dl. His lunch blood glucose range is generally >200 mg/dl. His overall blood glucose range is >200 mg/dl. An ACE inhibitor/angiotensin II receptor blocker is being taken. Eye exam is current.  Hyperlipidemia  This is a chronic problem. The current episode started more than 1 year ago. Exacerbating diseases include diabetes and obesity. Pertinent negatives include no chest pain, myalgias or shortness of  breath. Current antihyperlipidemic treatment includes statins. Risk factors for coronary artery disease include dyslipidemia, hypertension, male sex, a sedentary lifestyle and obesity.  Hypertension  This is a chronic problem. The current episode started more than 1 year ago. The problem is uncontrolled. Pertinent negatives include no chest pain, headaches, neck pain, palpitations or shortness of breath. Risk factors for coronary artery disease include dyslipidemia, diabetes mellitus, male gender, obesity and sedentary lifestyle. Past treatments include ACE inhibitors. Hypertensive end-organ damage includes kidney disease.     Review of Systems  Constitutional: Negative for chills, fatigue, fever and unexpected weight change.  HENT: Negative for dental problem, mouth sores and trouble swallowing.   Eyes: Negative for visual disturbance.  Respiratory: Negative for cough, choking, chest tightness, shortness of breath and wheezing.   Cardiovascular: Negative for chest pain, palpitations and leg swelling.  Gastrointestinal: Negative for abdominal distention, abdominal pain, constipation, diarrhea, nausea and  vomiting.  Endocrine: Negative for polydipsia, polyphagia and polyuria.  Genitourinary: Negative for dysuria, flank pain, hematuria and urgency.  Musculoskeletal: Negative for back pain, gait problem, myalgias and neck pain.  Skin: Negative for pallor, rash and wound.  Neurological: Negative for seizures, syncope, weakness, numbness and headaches.  Psychiatric/Behavioral: Negative.  Negative for confusion and dysphoric mood.    Objective:    BP (!) 148/90   Wt 241 lb (109.3 kg)   BMI 31.80 kg/m   Wt Readings from Last 3 Encounters:  07/08/18 241 lb (109.3 kg)  07/07/18 242 lb (109.8 kg)  04/28/18 244 lb (110.7 kg)    Physical Exam  Constitutional: He is oriented to person, place, and time. He appears well-developed. He is cooperative. No distress.  HENT:  Head: Normocephalic and  atraumatic.  Eyes: EOM are normal.  Neck: Normal range of motion. Neck supple. No tracheal deviation present. No thyromegaly present.  Cardiovascular: Normal rate, S1 normal and S2 normal. Exam reveals no gallop.  No murmur heard. Pulses:      Dorsalis pedis pulses are 1+ on the right side, and 1+ on the left side.       Posterior tibial pulses are 1+ on the right side, and 1+ on the left side.  Pulmonary/Chest: Effort normal. No respiratory distress. He has no wheezes.  Abdominal: He exhibits no distension. There is no tenderness. There is no guarding and no CVA tenderness.  Musculoskeletal: He exhibits no edema.       Right shoulder: He exhibits no swelling and no deformity.  Neurological: He is alert and oriented to person, place, and time. He has normal strength. No cranial nerve deficit or sensory deficit. Gait normal.  Skin: Skin is warm and dry. No rash noted. No cyanosis. Nails show no clubbing.  Psychiatric: He has a normal mood and affect. His speech is normal. Judgment normal. Cognition and memory are normal.   CMP     Component Value Date/Time   NA 136 07/07/2018 1113   K 4.2 07/07/2018 1113   CL 104 07/07/2018 1113   CO2 25 07/07/2018 1113   GLUCOSE 316 (H) 07/07/2018 1113   BUN 20 07/07/2018 1113   CREATININE 1.28 (H) 07/07/2018 1113   CREATININE 1.49 (H) 01/28/2018 0758   CALCIUM 9.6 07/07/2018 1113   PROT 6.0 (L) 01/28/2018 0758   ALBUMIN 4.1 05/27/2017 0823   AST 15 01/28/2018 0758   ALT 16 01/28/2018 0758   ALKPHOS 43 05/27/2017 0823   BILITOT 0.3 01/28/2018 0758   GFRNONAA 58 (L) 07/07/2018 1113   GFRNONAA 50 (L) 01/28/2018 0758   GFRAA >60 07/07/2018 1113   GFRAA 57 (L) 01/28/2018 0758     Diabetic Labs (most recent): Lab Results  Component Value Date   HGBA1C 7.1 (H) 01/28/2018   HGBA1C 7.0 (H) 10/01/2017   HGBA1C 7.5 (H) 05/27/2017     Assessment & Plan:   1. Uncontrolled type 2 diabetes mellitus with worsening chronic kidney disease.  -  Patient has currently uncontrolled symptomatic type 2 DM since  62 years of age. - He returns before his scheduled appointment due to loss of control with significantly above target glycemic profile over the last 14 days averaging 245.    He was recently taken off of his Steward by his oncologist. His most recent A1c was 7.1% in March 2019.    - Recent labs reviewed.   His diabetes is complicated by stage 2 CK D and patient remains at a high  risk for more acute and chronic complications of diabetes which include CAD, CVA, CKD, retinopathy, and neuropathy. These are all discussed in detail with the patient.  - I have counseled the patient on diet management and weight loss, by adopting a carbohydrate restricted/protein rich diet.  -  Suggestion is made for him to avoid simple carbohydrates  from his diet including Cakes, Sweet Desserts / Pastries, Ice Cream, Soda (diet and regular), Sweet Tea, Candies, Chips, Cookies, Store Bought Juices, Alcohol in Excess of  1-2 drinks a day, Artificial Sweeteners, and "Sugar-free" Products. This will help patient to have stable blood glucose profile and potentially avoid unintended weight gain.  - I encouraged the patient to switch to  unprocessed or minimally processed complex starch and increased protein intake (animal or plant source), fruits, and vegetables.  - Patient is advised to stick to a routine mealtimes to eat 3 meals  a day and avoid unnecessary snacks ( to snack only to correct hypoglycemia).   - I have approached patient with the following individualized plan to manage diabetes and patient agrees:   -He has room on his basal insulin before his being considered for prandial insulin.  -I discussed and increased his Tyler Aas to 50 units nightly associated with monitoring of blood glucose 4 times a day- before meals and at bedtime and return on his scheduled appointment for reevaluation.    -Patient is encouraged to call clinic for blood glucose  levels less than 70 or above 200 mg /dl.  - His labs show improving renal function.  He is advised to remain on low-dose metformin at 500 mg p.o. twice daily-daily after breakfast and supper.   - He is advised to avoid any over-the-counter NSAIDs and avoid dehydration.    - Patient specific target  A1c;  LDL, HDL, Triglycerides, and  Waist Circumference were discussed in detail.  2) BP/HTN: His blood pressure is not controlled to target.  He is advised to continue his current blood pressure medications including amlodipine 10 mg p.o. daily, ramipril 5 mg p.o. Daily, as well as Bystolic 5 mg p.o. daily.   3) Lipids/HPL: No recent lipid panel to review.  He is advised to continue his atorvastatin 20 mg p.o. nightly. He will be considered for fasting lipid panel on subsequent visits.   4)  Weight/Diet: CDE Consult has been initiated , exercise, and detailed carbohydrates information provided.  5) Chronic Care/Health Maintenance:  -Patient is on ACEI/ARB and Statin medications and encouraged to continue to follow up with Ophthalmology, Podiatrist at least yearly or according to recommendations, and advised to   stay away from smoking. I have recommended yearly flu vaccine and pneumonia vaccination at least every 5 years; moderate intensity exercise for up to 150 minutes weekly; and  sleep for at least 7 hours a day.  - I advised patient to maintain close follow up with Sharilyn Sites, MD for primary care needs.  - Time spent with the patient: 25 min, of which >50% was spent in reviewing his blood glucose logs , discussing his hypo- and hyper-glycemic episodes, reviewing his current and  previous labs and insulin doses and developing a plan to avoid hypo- and hyper-glycemia. Please refer to Patient Instructions for Blood Glucose Monitoring and Insulin/Medications Dosing Guide"  in media tab for additional information. Merrily Brittle. participated in the discussions, expressed understanding, and  voiced agreement with the above plans.  All questions were answered to his satisfaction. he is encouraged to contact clinic should he  have any questions or concerns prior to his return visit.  Follow up plan: - Return for Keep Regular Appointment with Pre-visit Labs.  Glade Lloyd, MD Phone: [phone]  Fax: [phone]  -  This note was partially dictated with voice recognition software. Similar sounding words can be transcribed inadequately or may not  be corrected upon review.  07/08/2018, 4:04 PM

## 2018-07-08 NOTE — Patient Instructions (Signed)

## 2018-07-08 NOTE — Telephone Encounter (Signed)
Pt seen in office today.

## 2018-07-14 ENCOUNTER — Ambulatory Visit (HOSPITAL_COMMUNITY)
Admission: RE | Admit: 2018-07-14 | Discharge: 2018-07-14 | Disposition: A | Discharge status: Home / Self Care | Payer: 59 | Source: Ambulatory Visit | Attending: Internal Medicine | Admitting: Internal Medicine

## 2018-07-14 ENCOUNTER — Ambulatory Visit (HOSPITAL_COMMUNITY): Payer: 59 | Admitting: Anesthesiology

## 2018-07-14 ENCOUNTER — Encounter (HOSPITAL_COMMUNITY)
Admission: RE | Disposition: A | Discharge status: Home / Self Care | Payer: Self-pay | Source: Ambulatory Visit | Attending: Internal Medicine

## 2018-07-14 DIAGNOSIS — D123 Benign neoplasm of transverse colon: Secondary | ICD-10-CM | POA: Diagnosis not present

## 2018-07-14 DIAGNOSIS — E785 Hyperlipidemia, unspecified: Secondary | ICD-10-CM | POA: Insufficient documentation

## 2018-07-14 DIAGNOSIS — Z800 Family history of malignant neoplasm of digestive organs: Secondary | ICD-10-CM | POA: Diagnosis not present

## 2018-07-14 DIAGNOSIS — I2510 Atherosclerotic heart disease of native coronary artery without angina pectoris: Secondary | ICD-10-CM | POA: Insufficient documentation

## 2018-07-14 DIAGNOSIS — E119 Type 2 diabetes mellitus without complications: Secondary | ICD-10-CM | POA: Diagnosis not present

## 2018-07-14 DIAGNOSIS — Z86010 Personal history of colonic polyps: Secondary | ICD-10-CM | POA: Diagnosis not present

## 2018-07-14 DIAGNOSIS — Z79899 Other long term (current) drug therapy: Secondary | ICD-10-CM | POA: Diagnosis not present

## 2018-07-14 DIAGNOSIS — I10 Essential (primary) hypertension: Secondary | ICD-10-CM | POA: Insufficient documentation

## 2018-07-14 DIAGNOSIS — K5730 Diverticulosis of large intestine without perforation or abscess without bleeding: Secondary | ICD-10-CM | POA: Insufficient documentation

## 2018-07-14 DIAGNOSIS — Z7984 Long term (current) use of oral hypoglycemic drugs: Secondary | ICD-10-CM | POA: Insufficient documentation

## 2018-07-14 DIAGNOSIS — Z1211 Encounter for screening for malignant neoplasm of colon: Secondary | ICD-10-CM | POA: Diagnosis not present

## 2018-07-14 DIAGNOSIS — Z7982 Long term (current) use of aspirin: Secondary | ICD-10-CM | POA: Insufficient documentation

## 2018-07-14 DIAGNOSIS — K635 Polyp of colon: Secondary | ICD-10-CM | POA: Diagnosis not present

## 2018-07-14 DIAGNOSIS — Z87891 Personal history of nicotine dependence: Secondary | ICD-10-CM | POA: Insufficient documentation

## 2018-07-14 HISTORY — PX: COLONOSCOPY WITH PROPOFOL: SHX5780

## 2018-07-14 HISTORY — PX: POLYPECTOMY: SHX5525

## 2018-07-14 LAB — GLUCOSE, CAPILLARY: GLUCOSE-CAPILLARY: 89 mg/dL (ref 70–99)

## 2018-07-14 SURGERY — COLONOSCOPY WITH PROPOFOL
Anesthesia: Monitor Anesthesia Care

## 2018-07-14 MED ORDER — HYDROCODONE-ACETAMINOPHEN 7.5-325 MG PO TABS: 1.0000 | ORAL_TABLET | Freq: Once | ORAL | Status: DC | PRN

## 2018-07-14 MED ORDER — CHLORHEXIDINE GLUCONATE CLOTH 2 % EX PADS: 6.0000 | MEDICATED_PAD | Freq: Once | CUTANEOUS | Status: DC

## 2018-07-14 MED ORDER — PROPOFOL 500 MG/50ML IV EMUL
INTRAVENOUS | Status: DC | PRN
  Administered 2018-07-14: 12:00:00 via INTRAVENOUS
  Administered 2018-07-14: 150 ug/kg/min via INTRAVENOUS

## 2018-07-14 MED ORDER — LACTATED RINGERS IV SOLN: INTRAVENOUS | Status: DC

## 2018-07-14 MED ORDER — MEPERIDINE HCL 100 MG/ML IJ SOLN: 6.2500 mg | INTRAMUSCULAR | Status: DC | PRN

## 2018-07-14 MED ORDER — HYDROMORPHONE HCL 1 MG/ML IJ SOLN: 0.2500 mg | INTRAMUSCULAR | Status: DC | PRN

## 2018-07-14 MED ORDER — PROPOFOL 10 MG/ML IV BOLUS
INTRAVENOUS | Status: AC
  Filled 2018-07-14: qty 20

## 2018-07-14 MED ORDER — LACTATED RINGERS IV SOLN
INTRAVENOUS | Status: DC | PRN
  Administered 2018-07-14: 09:00:00 via INTRAVENOUS

## 2018-07-14 MED ORDER — PROMETHAZINE HCL 25 MG/ML IJ SOLN: 6.2500 mg | INTRAMUSCULAR | Status: DC | PRN

## 2018-07-14 MED ORDER — MIDAZOLAM HCL 5 MG/5ML IJ SOLN
INTRAMUSCULAR | Status: DC | PRN
  Administered 2018-07-14: 2 mg via INTRAVENOUS

## 2018-07-14 NOTE — H&P (Signed)
@LOGO @   Primary Care Physician:  Sharilyn Sites, MD Primary Gastroenterologist:  Dr. Gala Romney  Pre-Procedure History & Physical: HPI:  Dylan Spears. is a 62 y.o. male here for here for surveillance colonoscopy. The tubulovillous adenoma 2009 with no follow-up. Patient denies any lower GI tract symptoms.  Past Medical History:  Diagnosis Date  . Chronic back pain   . CML (chronic myelocytic leukemia) ([place])   . Diabetes mellitus without complication ([place])   . History of stress test    myoview study suggested the possibility of basal inferior wall ischemia which was present in all 3 tonographic views.  [name] Hx of echocardiogram 05/2012   EF 55%Mildly hypertrophic left ventricle with normal systolic function and early signs of mild diastolic dysfuntion. Moderately dilated left atrium, mild aortic valve sclerosis with out stenosis.  . Hyperlipemia   . Hypertension   . Leukemia [facility])     Past Surgical History:  Procedure Laterality Date  . BACK SURGERY  12/2006  . CARDIAC CATHETERIZATION  2010   revealed mild nonobstructive CAD  . CARDIAC CATHETERIZATION  07/2012   This showed essentially normal coronary arteries without obstruction. there was normal LV function.  [name] KNEE SURGERY Left 01/1994  . LEFT HEART CATHETERIZATION WITH CORONARY ANGIOGRAM N/A 07/29/2012   Procedure: LEFT HEART CATHETERIZATION WITH CORONARY ANGIOGRAM;  Surgeon: Troy Sine, MD;  Location: [facility] CATH LAB;  Service: Cardiovascular;  Laterality: N/A;    Prior to Admission medications   Medication Sig Start Date End Date Taking? Authorizing Provider  amLODipine (NORVASC) 10 MG tablet TAKE 1 TABLET BY MOUTH ONCE A DAY FOR BLOOD PRESSURE. 06/30/18  Yes Troy Sine, MD  aspirin EC 81 MG tablet Take 81 mg by mouth daily.   Yes [provider]  atorvastatin (LIPITOR) 20 MG tablet Take 20 mg by mouth at bedtime.    Yes [provider]  BYSTOLIC 10 MG tablet TAKE ONE TABLET BY MOUTH EVERY DAY. Patient  taking differently: TAKE ONE-HALF TABLET (5 MG) BY MOUTH EVERY DAY. 03/31/18  Yes Troy Sine, MD  insulin degludec (TRESIBA FLEXTOUCH) 100 UNIT/ML SOPN FlexTouch Pen Inject 0.5 mLs (50 Units total) into the skin at bedtime. 07/08/18  Yes Nida, Marella Chimes, MD  metFORMIN (GLUCOPHAGE) 500 MG tablet TAKE (1) TABLET BY MOUTH TWICE DAILY WITH MEALS. 02/04/18  Yes Nida, Marella Chimes, MD  Multiple Vitamin (MULTIVITAMIN WITH MINERALS) TABS Take 1 tablet by mouth daily.   Yes [provider]  nebivolol (BYSTOLIC) 5 MG tablet Take 5 mg by mouth daily.   Yes [provider]  Oxycodone HCl 10 MG TABS Take 10 mg by mouth daily as needed (for pain.).  10/04/16  Yes [provider]  ramipril (ALTACE) 5 MG capsule TAKE (1) CAPSULE BY MOUTH ONCE DAILY. 03/31/18  Yes Troy Sine, MD  ACCU-CHEK AVIVA PLUS test strip USE TO TEST 3 TIMES DAILY. 03/10/18   Cassandria Anger, MD  ACCU-CHEK SOFTCLIX LANCETS lancets USE TO TEST 3 TIMES DAILY. 03/10/18   Cassandria Anger, MD  B-D ULTRAFINE III SHORT PEN 31G X 8 MM MISC USING WITH TOUJEO 12/09/17   Cassandria Anger, MD  polyethylene glycol-electrolytes (TRILYTE) 420 g solution Take 4,000 mLs by mouth as directed. 02/18/18   Carlis Stable, NP    Allergies as of 04/29/2018 - Review Complete 04/28/2018  Allergen Reaction Noted  . Tylenol [acetaminophen] Other (See Comments) 07/24/2012    Family History  Problem Relation Age of Onset  .  Colon cancer Mother 61  . Cancer Father        Unsure of what type; not colon CA    Social History   Socioeconomic History  . Marital status: Married    Spouse name: Not on file  . Number of children: Not on file  . Years of education: Not on file  . Highest education level: Not on file  Occupational History  . Not on file  Social Needs  . Financial resource strain: Not on file  . Food insecurity:    Worry: Not on file    Inability: Not on file  . Transportation needs:     Medical: Not on file    Non-medical: Not on file  Tobacco Use  . Smoking status: Former Smoker    Packs/day: 0.25    Years: 15.00    Pack years: 3.75    Types: Cigarettes    Last attempt to quit: 07/08/1987    Years since quitting: 31.0  . Smokeless tobacco: Never Used  . Tobacco comment: "30 years ago"  Substance and Sexual Activity  . Alcohol use: Yes    Comment: "very seldom"  . Drug use: No  . Sexual activity: Yes    Birth control/protection: None  Lifestyle  . Physical activity:    Days per week: Not on file    Minutes per session: Not on file  . Stress: Not on file  Relationships  . Social connections:    Talks on phone: Not on file    Gets together: Not on file    Attends religious service: Not on file    Active member of club or organization: Not on file    Attends meetings of clubs or organizations: Not on file    Relationship status: Not on file  . Intimate partner violence:    Fear of current or ex partner: Not on file    Emotionally abused: Not on file    Physically abused: Not on file    Forced sexual activity: Not on file  Other Topics Concern  . Not on file  Social History Narrative  . Not on file    Review of Systems: See HPI, otherwise negative ROS  Physical Exam: Pulse (!) 57   Temp 98.4 F (36.9 C) (Oral)   Resp 18   SpO2 97%  General:   Alert,  Well-developed, well-nourished, pleasant and cooperative in NAD Neck:  Supple; no masses or thyromegaly. No significant cervical adenopathy. Lungs:  Clear throughout to auscultation.   No wheezes, crackles, or rhonchi. No acute distress. Heart:  Regular rate and rhythm; no murmurs, clicks, rubs,  or gallops. Abdomen: Non-distended, normal bowel sounds.  Soft and nontender without appreciable mass or hepatosplenomegaly.  Pulses:  Normal pulses noted. Extremities:  Without clubbing or edema.  Impression/Plan:   62 year old gentleman here for surveillance colonoscopy.  The risks, benefits, limitations,  alternatives and imponderables have been reviewed with the patient. Questions have been answered. All parties are agreeable.      Notice: This dictation was prepared with Dragon dictation along with smaller phrase technology. Any transcriptional errors that result from this process are unintentional and may not be corrected upon review.

## 2018-07-14 NOTE — Op Note (Signed)
[facility] Patient Name: Dylan Spears Procedure Date: 07/14/2018 11:36 AM MRN: [id number] Date of Birth: 10-04-1956 Attending MD: Norvel Richards , MD CSN: [id number] Age: 62 Admit Type: Outpatient Procedure:                Colonoscopy Indications:              High risk colon cancer surveillance: Personal                            history of colonic polyps Providers:                Norvel Richards, MD, Otis Peak B. Sharon Seller, RN,                            Randa Spike, Technician Referring MD:              Medicines:                Propofol per Anesthesia Complications:            No immediate complications. Estimated Blood Loss:     Estimated blood loss was minimal. Procedure:                Pre-Anesthesia Assessment:                           - Prior to the procedure, a History and Physical                            was performed, and patient medications and                            allergies were reviewed. The patient's tolerance of                            previous anesthesia was also reviewed. The risks                            and benefits of the procedure and the sedation                            options and risks were discussed with the patient.                            All questions were answered, and informed consent                            was obtained. Prior Anticoagulants: The patient has                            taken no previous anticoagulant or antiplatelet                            agents. ASA Grade Assessment: II - A patient with  mild systemic disease. After reviewing the risks                            and benefits, the patient was deemed in                            satisfactory condition to undergo the procedure.                           After obtaining informed consent, the colonoscope                            was passed under direct vision. Throughout the                            procedure,  the patient's blood pressure, pulse, and                            oxygen saturations were monitored continuously. The                            [id number] ([id number]) scope was introduced through                            the and advanced to the the cecum, identified by                            appendiceal orifice and ileocecal valve. The                            colonoscopy was performed without difficulty. The                            patient tolerated the procedure well. The quality                            of the bowel preparation was adequate. The                            ileocecal valve, appendiceal orifice, and rectum                            were photographed. The entire colon was well                            visualized. Scope In: 12:07:48 PM Scope Out: 12:24:33 PM Scope Withdrawal Time: 0 hours 7 minutes 32 seconds  Total Procedure Duration: 0 hours 16 minutes 45 seconds  Findings:      The perianal and digital rectal examinations were normal.      Scattered medium-mouthed diverticula were found in the sigmoid colon and       descending colon.      Two semi-pedunculated polyps were found in the hepatic flexure. The       polyps were 4 to 6 mm in size.  These polyps were removed with a cold       snare. Resection and retrieval were complete. Estimated blood loss was       minimal.      The exam was otherwise without abnormality on direct and retroflexion       views. Impression:               - Diverticulosis in the sigmoid colon and in the                            descending colon.                           - Two 4 to 6 mm polyps at the hepatic flexure,                            removed with a cold snare. Resected and retrieved.                           - The examination was otherwise normal on direct                            and retroflexion views. Moderate Sedation:      Moderate (conscious) sedation was personally administered by an       anesthesia  professional. The following parameters were monitored: oxygen       saturation, heart rate, blood pressure, respiratory rate, EKG, adequacy       of pulmonary ventilation, and response to care. Total physician       intraservice time was 23 minutes. Recommendation:           - Patient has a contact number available for                            emergencies. The signs and symptoms of potential                            delayed complications were discussed with the                            patient. Return to normal activities tomorrow.                            Written discharge instructions were provided to the                            patient.                           - Resume previous diet.                           - Continue present medications.                           - Repeat colonoscopy date to be determined after  pending pathology results are reviewed for                            surveillance based on pathology results.                           - Return to GI office (date not yet determined). Procedure Code(s):        --- Professional ---                           [phone], Colonoscopy, flexible; with removal of                            tumor(s), polyp(s), or other lesion(s) by snare                            technique Diagnosis Code(s):        --- Professional ---                           Z86.010, Personal history of colonic polyps                           D12.3, Benign neoplasm of transverse colon (hepatic                            flexure or splenic flexure)                           K57.30, Diverticulosis of large intestine without                            perforation or abscess without bleeding CPT copyright 2017 American Medical Association. All rights reserved. The codes documented in this report are preliminary and upon coder review may  be revised to meet current compliance requirements. Cristopher Estimable. Wyndi Northrup, MD Norvel Richards, MD 07/14/2018 12:30:50 PM This report has been signed electronically. Number of Addenda: 0

## 2018-07-14 NOTE — Anesthesia Postprocedure Evaluation (Signed)
Anesthesia Post Note  Patient: Dylan Spears.  Procedure(s) Performed: COLONOSCOPY WITH PROPOFOL (N/A ) POLYPECTOMY  Patient location during evaluation: PACU Anesthesia Type: MAC Level of consciousness: awake and alert and patient cooperative Pain management: pain level controlled Vital Signs Assessment: post-procedure vital signs reviewed and stable Respiratory status: spontaneous breathing, nonlabored ventilation and respiratory function stable Cardiovascular status: blood pressure returned to baseline Postop Assessment: no apparent nausea or vomiting Anesthetic complications: no     Last Vitals:  Vitals:   07/14/18 0927  Pulse: (!) 57  Resp: 18  Temp: 36.9 C  SpO2: 97%    Last Pain:  Vitals:   07/14/18 1200  TempSrc:   PainSc: 0-No pain                 Axell Trigueros J

## 2018-07-14 NOTE — Anesthesia Preprocedure Evaluation (Signed)
Anesthesia Evaluation  Patient identified by MRN, date of birth, ID band Patient awake    Reviewed: Allergy & Precautions, H&P , NPO status , Patient's Chart, lab work & pertinent test results, reviewed documented beta blocker date and time   Airway Mallampati: III  TM Distance: >3 FB Neck ROM: full    Dental no notable dental hx. (+) Teeth Intact, Dental Advidsory Given   Pulmonary neg pulmonary ROS, former smoker,    Pulmonary exam normal breath sounds clear to auscultation       Cardiovascular Exercise Tolerance: Good hypertension, negative cardio ROS   Rhythm:regular Rate:Normal     Neuro/Psych negative neurological ROS  negative psych ROS   GI/Hepatic negative GI ROS, Neg liver ROS,   Endo/Other  negative endocrine ROSdiabetes, Type 2  Renal/GU Renal diseasenegative Renal ROS  negative genitourinary   Musculoskeletal   Abdominal   Peds  Hematology negative hematology ROS (+)   Anesthesia Other Findings 20-plus yrs h/o DM  Denies neuropthy Denies any h/o CP, MI  Reproductive/Obstetrics negative OB ROS                             Anesthesia Physical Anesthesia Plan  ASA: III  Anesthesia Plan: MAC   Post-op Pain Management:    Induction:   PONV Risk Score and Plan:   Airway Management Planned:   Additional Equipment:   Intra-op Plan:   Post-operative Plan:   Informed Consent: I have reviewed the patients History and Physical, chart, labs and discussed the procedure including the risks, benefits and alternatives for the proposed anesthesia with the patient or authorized representative who has indicated his/her understanding and acceptance.   Dental Advisory Given  Plan Discussed with: CRNA and Anesthesiologist  Anesthesia Plan Comments:         Anesthesia Quick Evaluation

## 2018-07-14 NOTE — Transfer of Care (Signed)
Immediate Anesthesia Transfer of Care Note  Patient: Dylan Spears.  Procedure(s) Performed: COLONOSCOPY WITH PROPOFOL (N/A ) POLYPECTOMY  Patient Location: PACU  Anesthesia Type:MAC  Level of Consciousness: awake and patient cooperative  Airway & Oxygen Therapy: Patient Spontanous Breathing and Patient connected to nasal cannula oxygen  Post-op Assessment: Report given to RN, Post -op Vital signs reviewed and stable and Patient moving all extremities  Post vital signs: Reviewed and stable  Last Vitals:  Vitals Value Taken Time  BP    Temp    Pulse    Resp    SpO2      Last Pain:  Vitals:   07/14/18 1200  TempSrc:   PainSc: 0-No pain         Complications: No apparent anesthesia complications

## 2018-07-14 NOTE — Discharge Instructions (Signed)
Colonoscopy Discharge Instructions  Read the instructions outlined below and refer to this sheet in the next few weeks. These discharge instructions provide you with general information on caring for yourself after you leave the hospital. Your doctor may also give you specific instructions. While your treatment has been planned according to the most current medical practices available, unavoidable complications occasionally occur. If you have any problems or questions after discharge, call Dr. Gala Romney at [phone]. ACTIVITY  You may resume your regular activity, but move at a slower pace for the next 24 hours.   Take frequent rest periods for the next 24 hours.   Walking will help get rid of the air and reduce the bloated feeling in your belly (abdomen).   No driving for 24 hours (because of the medicine (anesthesia) used during the test).    Do not sign any important legal documents or operate any machinery for 24 hours (because of the anesthesia used during the test).  NUTRITION  Drink plenty of fluids.   You may resume your normal diet as instructed by your doctor.   Begin with a light meal and progress to your normal diet. Heavy or fried foods are harder to digest and may make you feel sick to your stomach (nauseated).   Avoid alcoholic beverages for 24 hours or as instructed.  MEDICATIONS  You may resume your normal medications unless your doctor tells you otherwise.  WHAT YOU CAN EXPECT TODAY  Some feelings of bloating in the abdomen.   Passage of more gas than usual.   Spotting of blood in your stool or on the toilet paper.  IF YOU HAD POLYPS REMOVED DURING THE COLONOSCOPY:  No aspirin products for 7 days or as instructed.   No alcohol for 7 days or as instructed.   Eat a soft diet for the next 24 hours.  FINDING OUT THE RESULTS OF YOUR TEST Not all test results are available during your visit. If your test results are not back during the visit, make an appointment  with your caregiver to find out the results. Do not assume everything is normal if you have not heard from your caregiver or the medical facility. It is important for you to follow up on all of your test results.  SEEK IMMEDIATE MEDICAL ATTENTION IF:  You have more than a spotting of blood in your stool.   Your belly is swollen (abdominal distention).   You are nauseated or vomiting.   You have a temperature over 101.   You have abdominal pain or discomfort that is severe or gets worse throughout the day.    Diverticulosis and colon polyp information provided  Further recommendations to follow pending review of pathology report    Diverticulosis Diverticulosis is a condition that develops when small pouches (diverticula) form in the wall of the large intestine (colon). The colon is where water is absorbed and stool is formed. The pouches form when the inside layer of the colon pushes through weak spots in the outer layers of the colon. You may have a few pouches or many of them. What are the causes? The cause of this condition is not known. What increases the risk? The following factors may make you more likely to develop this condition:  Being older than age 92. Your risk for this condition increases with age. Diverticulosis is rare among people younger than age 65. By age 92, many people have it.  Eating a low-fiber diet.  Having frequent constipation.  Being  overweight.  Not getting enough exercise.  Smoking.  Taking over-the-counter pain medicines, like aspirin and ibuprofen.  Having a family history of diverticulosis.  What are the signs or symptoms? In most people, there are no symptoms of this condition. If you do have symptoms, they may include:  Bloating.  Cramps in the abdomen.  Constipation or diarrhea.  Pain in the lower left side of the abdomen.  How is this diagnosed? This condition is most often diagnosed during an exam for other colon problems.  Because diverticulosis usually has no symptoms, it often cannot be diagnosed independently. This condition may be diagnosed by:  Using a flexible scope to examine the colon (colonoscopy).  Taking an X-ray of the colon after dye has been put into the colon (barium enema).  Doing a CT scan.  How is this treated? You may not need treatment for this condition if you have never developed an infection related to diverticulosis. If you have had an infection before, treatment may include:  Eating a high-fiber diet. This may include eating more fruits, vegetables, and grains.  Taking a fiber supplement.  Taking a live bacteria supplement (probiotic).  Taking medicine to relax your colon.  Taking antibiotic medicines.  Follow these instructions at home:  Drink 6-8 glasses of water or more each day to prevent constipation.  Try not to strain when you have a bowel movement.  If you have had an infection before: ? Eat more fiber as directed by your health care provider or your diet and nutrition specialist (dietitian). ? Take a fiber supplement or probiotic, if your health care provider approves.  Take over-the-counter and prescription medicines only as told by your health care provider.  If you were prescribed an antibiotic, take it as told by your health care provider. Do not stop taking the antibiotic even if you start to feel better.  Keep all follow-up visits as told by your health care provider. This is important. Contact a health care provider if:  You have pain in your abdomen.  You have bloating.  You have cramps.  You have not had a bowel movement in 3 days. Get help right away if:  Your pain gets worse.  Your bloating becomes very bad.  You have a fever or chills, and your symptoms suddenly get worse.  You vomit.  You have bowel movements that are bloody or black.  You have bleeding from your rectum. Summary  Diverticulosis is a condition that develops when  small pouches (diverticula) form in the wall of the large intestine (colon).  You may have a few pouches or many of them.  This condition is most often diagnosed during an exam for other colon problems.  If you have had an infection related to diverticulosis, treatment may include increasing the fiber in your diet, taking supplements, or taking medicines. This information is not intended to replace advice given to you by your health care provider. Make sure you discuss any questions you have with your health care provider. Document Released: 08/09/2004 Document Revised: 10/01/2016 Document Reviewed: 10/01/2016 Elsevier Interactive Patient Education  2017 [employer].     Colon Polyps Polyps are tissue growths inside the body. Polyps can grow in many places, including the large intestine (colon). A polyp may be a round bump or a mushroom-shaped growth. You could have one polyp or several. Most colon polyps are noncancerous (benign). However, some colon polyps can become cancerous over time. What are the causes? The exact cause of colon  polyps is not known. What increases the risk? This condition is more likely to develop in people who:  Have a family history of colon cancer or colon polyps.  Are older than 43 or older than 45 if they are African American.  Have inflammatory bowel disease, such as ulcerative colitis or Crohn disease.  Are overweight.  Smoke cigarettes.  Do not get enough exercise.  Drink too much alcohol.  Eat a diet that is: ? High in fat and red meat. ? Low in fiber.  Had childhood cancer that was treated with abdominal radiation.  What are the signs or symptoms? Most polyps do not cause symptoms. If you have symptoms, they may include:  Blood coming from your rectum when having a bowel movement.  Blood in your stool.The stool may look dark red or black.  A change in bowel habits, such as constipation or diarrhea.  How is this diagnosed? This  condition is diagnosed with a colonoscopy. This is a procedure that uses a lighted, flexible scope to look at the inside of your colon. How is this treated? Treatment for this condition involves removing any polyps that are found. Those polyps will then be tested for cancer. If cancer is found, your health care provider will talk to you about options for colon cancer treatment. Follow these instructions at home: Diet  Eat plenty of fiber, such as fruits, vegetables, and whole grains.  Eat foods that are high in calcium and vitamin D, such as milk, cheese, yogurt, eggs, liver, fish, and broccoli.  Limit foods high in fat, red meats, and processed meats, such as hot dogs, sausage, bacon, and lunch meats.  Maintain a healthy weight, or lose weight if recommended by your health care provider. General instructions  Do not smoke cigarettes.  Do not drink alcohol excessively.  Keep all follow-up visits as told by your health care provider. This is important. This includes keeping regularly scheduled colonoscopies. Talk to your health care provider about when you need a colonoscopy.  Exercise every day or as told by your health care provider. Contact a health care provider if:  You have new or worsening bleeding during a bowel movement.  You have new or increased blood in your stool.  You have a change in bowel habits.  You unexpectedly lose weight. This information is not intended to replace advice given to you by your health care provider. Make sure you discuss any questions you have with your health care provider. Document Released: 08/08/2004 Document Revised: 04/19/2016 Document Reviewed: 10/03/2015 Elsevier Interactive Patient Education  2018 [place], Care After These instructions provide you with information about caring for yourself after your procedure. Your health care provider may also give you more specific instructions. Your treatment has  been planned according to current medical practices, but problems sometimes occur. Call your health care provider if you have any problems or questions after your procedure. What can I expect after the procedure? After your procedure, it is common to:  Feel sleepy for several hours.  Feel clumsy and have poor balance for several hours.  Feel forgetful about what happened after the procedure.  Have poor judgment for several hours.  Feel nauseous or vomit.  Have a sore throat if you had a breathing tube during the procedure.  Follow these instructions at home: For at least 24 hours after the procedure:   Do not: ? Participate in activities in which you could fall or become injured. ?  Drive. ? Use heavy machinery. ? Drink alcohol. ? Take sleeping pills or medicines that cause drowsiness. ? Make important decisions or sign legal documents. ? Take care of children on your own.  Rest. Eating and drinking  Follow the diet that is recommended by your health care provider.  If you vomit, drink water, juice, or soup when you can drink without vomiting.  Make sure you have little or no nausea before eating solid foods. General instructions  Have a responsible adult stay with you until you are awake and alert.  Take over-the-counter and prescription medicines only as told by your health care provider.  If you smoke, do not smoke without supervision.  Keep all follow-up visits as told by your health care provider. This is important. Contact a health care provider if:  You keep feeling nauseous or you keep vomiting.  You feel light-headed.  You develop a rash.  You have a fever. Get help right away if:  You have trouble breathing. This information is not intended to replace advice given to you by your health care provider. Make sure you discuss any questions you have with your health care provider. Document Released: 03/04/2016 Document Revised: 07/04/2016 Document  Reviewed: 03/04/2016 Elsevier Interactive Patient Education  [employer].

## 2018-07-16 ENCOUNTER — Encounter: Payer: Self-pay | Admitting: Internal Medicine

## 2018-07-16 DIAGNOSIS — C9211 Chronic myeloid leukemia, BCR/ABL-positive, in remission: Secondary | ICD-10-CM | POA: Diagnosis not present

## 2018-07-17 ENCOUNTER — Encounter (HOSPITAL_COMMUNITY): Payer: Self-pay | Admitting: Internal Medicine

## 2018-07-21 ENCOUNTER — Telehealth: Payer: Self-pay | Admitting: "Endocrinology

## 2018-07-21 DIAGNOSIS — C9211 Chronic myeloid leukemia, BCR/ABL-positive, in remission: Secondary | ICD-10-CM | POA: Diagnosis not present

## 2018-07-21 DIAGNOSIS — M329 Systemic lupus erythematosus, unspecified: Secondary | ICD-10-CM | POA: Diagnosis not present

## 2018-07-21 DIAGNOSIS — N039 Chronic nephritic syndrome with unspecified morphologic changes: Secondary | ICD-10-CM | POA: Diagnosis not present

## 2018-07-21 DIAGNOSIS — Z79899 Other long term (current) drug therapy: Secondary | ICD-10-CM | POA: Diagnosis not present

## 2018-07-21 NOTE — Telephone Encounter (Signed)
Dylan Spears is calling stating that his blood sugars have been running high, please advise of any changes  Fri 08/23 222-Am 200-Lunch 228-Dinner 248-Bed  Sat 08/24 177-Am 200-Lunch 334-Dinner  304-Bed  Sun 08/25 194-Am 166-Lunch 209-Dinner 298-Bed  Mon 08/26 147-Am

## 2018-07-22 NOTE — Telephone Encounter (Signed)
Pt.notified

## 2018-07-22 NOTE — Telephone Encounter (Signed)
Advise to increase Tresiba to 60 units qhs. Keep appointment.

## 2018-07-31 DIAGNOSIS — R809 Proteinuria, unspecified: Secondary | ICD-10-CM | POA: Diagnosis not present

## 2018-07-31 DIAGNOSIS — I10 Essential (primary) hypertension: Secondary | ICD-10-CM | POA: Diagnosis not present

## 2018-07-31 DIAGNOSIS — N183 Chronic kidney disease, stage 3 (moderate): Secondary | ICD-10-CM | POA: Diagnosis not present

## 2018-07-31 DIAGNOSIS — E1122 Type 2 diabetes mellitus with diabetic chronic kidney disease: Secondary | ICD-10-CM | POA: Diagnosis not present

## 2018-07-31 DIAGNOSIS — E1165 Type 2 diabetes mellitus with hyperglycemia: Secondary | ICD-10-CM | POA: Diagnosis not present

## 2018-07-31 DIAGNOSIS — Z794 Long term (current) use of insulin: Secondary | ICD-10-CM | POA: Diagnosis not present

## 2018-08-01 LAB — COMPLETE METABOLIC PANEL WITH GFR
AG Ratio: 1.5 (calc) (ref 1.0–2.5)
ALT: 13 U/L (ref 9–46)
AST: 13 U/L (ref 10–35)
Albumin: 3.9 g/dL (ref 3.6–5.1)
Alkaline phosphatase (APISO): 53 U/L (ref 40–115)
BUN/Creatinine Ratio: 13 (calc) (ref 6–22)
BUN: 19 mg/dL (ref 7–25)
CALCIUM: 9.7 mg/dL (ref 8.6–10.3)
CHLORIDE: 104 mmol/L (ref 98–110)
CO2: 27 mmol/L (ref 20–32)
CREATININE: 1.45 mg/dL — AB (ref 0.70–1.25)
GFR, EST AFRICAN AMERICAN: 59 mL/min/{1.73_m2} — AB (ref >=60)
GFR, EST NON AFRICAN AMERICAN: 51 mL/min/{1.73_m2} — AB (ref >=60)
GLUCOSE: 83 mg/dL (ref 65–99)
Globulin: 2.6 g/dL (calc) (ref 1.9–3.7)
Potassium: 4 mmol/L (ref 3.5–5.3)
Sodium: 139 mmol/L (ref 135–146)
TOTAL PROTEIN: 6.5 g/dL (ref 6.1–8.1)
Total Bilirubin: 0.5 mg/dL (ref 0.2–1.2)

## 2018-08-01 LAB — HEMOGLOBIN A1C
EAG (MMOL/L): 12 (calc)
Hgb A1c MFr Bld: 9.2 % of total Hgb — ABNORMAL HIGH (ref <5.7)
Mean Plasma Glucose: 217 (calc)

## 2018-08-07 ENCOUNTER — Encounter: Payer: Self-pay | Admitting: "Endocrinology

## 2018-08-07 ENCOUNTER — Ambulatory Visit (INDEPENDENT_AMBULATORY_CARE_PROVIDER_SITE_OTHER): Payer: 59 | Admitting: "Endocrinology

## 2018-08-07 VITALS — BP 148/81 | HR 86 | Ht 73.0 in | Wt 239.0 lb

## 2018-08-07 DIAGNOSIS — E1165 Type 2 diabetes mellitus with hyperglycemia: Secondary | ICD-10-CM

## 2018-08-07 DIAGNOSIS — I10 Essential (primary) hypertension: Secondary | ICD-10-CM | POA: Diagnosis not present

## 2018-08-07 DIAGNOSIS — N183 Chronic kidney disease, stage 3 (moderate): Secondary | ICD-10-CM

## 2018-08-07 DIAGNOSIS — IMO0002 Reserved for concepts with insufficient information to code with codable children: Secondary | ICD-10-CM

## 2018-08-07 DIAGNOSIS — E1122 Type 2 diabetes mellitus with diabetic chronic kidney disease: Secondary | ICD-10-CM

## 2018-08-07 DIAGNOSIS — E782 Mixed hyperlipidemia: Secondary | ICD-10-CM | POA: Diagnosis not present

## 2018-08-07 DIAGNOSIS — Z794 Long term (current) use of insulin: Secondary | ICD-10-CM | POA: Diagnosis not present

## 2018-08-07 MED ORDER — INSULIN DEGLUDEC 100 UNIT/ML ~~LOC~~ SOPN: 70.0000 [IU] | PEN_INJECTOR | Freq: Every day | SUBCUTANEOUS | 2 refills | Status: DC

## 2018-08-07 NOTE — Patient Instructions (Signed)

## 2018-08-07 NOTE — Progress Notes (Signed)
Endocrinology follow-up note  Subjective:    Patient ID: Dylan Huy., male    DOB: 02-02-1956. Patient is being seen in follow-up   for management of currently uncontrolled, complicated type 2 diabetes requested by  Sharilyn Sites, MD  Past Medical History:  Diagnosis Date  . Chronic back pain   . CML (chronic myelocytic leukemia) ([place])   . Diabetes mellitus without complication ([place])   . History of stress test    myoview study suggested the possibility of basal inferior wall ischemia which was present in all 3 tonographic views.  [name] Hx of echocardiogram 05/2012   EF 55%Mildly hypertrophic left ventricle with normal systolic function and early signs of mild diastolic dysfuntion. Moderately dilated left atrium, mild aortic valve sclerosis with out stenosis.  . Hyperlipemia   . Hypertension   . Leukemia [facility])    Past Surgical History:  Procedure Laterality Date  . BACK SURGERY  12/2006  . CARDIAC CATHETERIZATION  2010   revealed mild nonobstructive CAD  . CARDIAC CATHETERIZATION  07/2012   This showed essentially normal coronary arteries without obstruction. there was normal LV function.  . COLONOSCOPY WITH PROPOFOL N/A 07/14/2018   Procedure: COLONOSCOPY WITH PROPOFOL;  Surgeon: Daneil Dolin, MD;  Location: [facility];  Service: Endoscopy;  Laterality: N/A;  10:45am  . KNEE SURGERY Left 01/1994  . LEFT HEART CATHETERIZATION WITH CORONARY ANGIOGRAM N/A 07/29/2012   Procedure: LEFT HEART CATHETERIZATION WITH CORONARY ANGIOGRAM;  Surgeon: Troy Sine, MD;  Location: [facility] CATH LAB;  Service: Cardiovascular;  Laterality: N/A;  . POLYPECTOMY  07/14/2018   Procedure: POLYPECTOMY;  Surgeon: Daneil Dolin, MD;  Location: [facility];  Service: Endoscopy;;  colon   Social History   Socioeconomic History  . Marital status: Married    Spouse name: Not on file  . Number of children: Not on file  . Years of education: Not on file  . Highest education level: Not on file   Occupational History  . Not on file  Social Needs  . Financial resource strain: Not on file  . Food insecurity:    Worry: Not on file    Inability: Not on file  . Transportation needs:    Medical: Not on file    Non-medical: Not on file  Tobacco Use  . Smoking status: Former Smoker    Packs/day: 0.25    Years: 15.00    Pack years: 3.75    Types: Cigarettes    Last attempt to quit: 07/08/1987    Years since quitting: 31.1  . Smokeless tobacco: Never Used  . Tobacco comment: "30 years ago"  Substance and Sexual Activity  . Alcohol use: Yes    Comment: "very seldom"  . Drug use: No  . Sexual activity: Yes    Birth control/protection: None  Lifestyle  . Physical activity:    Days per week: Not on file    Minutes per session: Not on file  . Stress: Not on file  Relationships  . Social connections:    Talks on phone: Not on file    Gets together: Not on file    Attends religious service: Not on file    Active member of club or organization: Not on file    Attends meetings of clubs or organizations: Not on file    Relationship status: Not on file  Other Topics Concern  . Not on file  Social History Narrative  . Not on file   Outpatient Encounter  Medications as of 08/07/2018  Medication Sig  . amLODipine (NORVASC) 5 MG tablet Take 5 mg by mouth daily.  . metFORMIN (GLUCOPHAGE) 500 MG tablet Take by mouth 2 (two) times daily.  [name] ACCU-CHEK AVIVA PLUS test strip USE TO TEST 3 TIMES DAILY.  [name] ACCU-CHEK SOFTCLIX LANCETS lancets USE TO TEST 3 TIMES DAILY.  [name] aspirin EC 81 MG tablet Take 81 mg by mouth daily.  [name] atorvastatin (LIPITOR) 20 MG tablet Take 20 mg by mouth at bedtime.   . B-D ULTRAFINE III SHORT PEN 31G X 8 MM MISC USING WITH TOUJEO  . insulin degludec (TRESIBA FLEXTOUCH) 100 UNIT/ML SOPN FlexTouch Pen Inject 0.7 mLs (70 Units total) into the skin at bedtime.  . Multiple Vitamin (MULTIVITAMIN WITH MINERALS) TABS Take 1 tablet by mouth daily.  . nebivolol (BYSTOLIC)  5 MG tablet Take 5 mg by mouth daily.  . Oxycodone HCl 10 MG TABS Take 10 mg by mouth daily as needed (for pain.).   [name] ramipril (ALTACE) 5 MG capsule TAKE (1) CAPSULE BY MOUTH ONCE DAILY.  . [DISCONTINUED] amLODipine (NORVASC) 10 MG tablet TAKE 1 TABLET BY MOUTH ONCE A DAY FOR BLOOD PRESSURE.  . [DISCONTINUED] insulin degludec (TRESIBA FLEXTOUCH) 100 UNIT/ML SOPN FlexTouch Pen Inject 0.5 mLs (50 Units total) into the skin at bedtime. (Patient taking differently: Inject 60 Units into the skin at bedtime. )  . [DISCONTINUED] metFORMIN (GLUCOPHAGE) 500 MG tablet TAKE (1) TABLET BY MOUTH TWICE DAILY WITH MEALS.  . [DISCONTINUED] polyethylene glycol-electrolytes (TRILYTE) 420 g solution Take 4,000 mLs by mouth as directed.   No facility-administered encounter medications on file as of 08/07/2018.    ALLERGIES: No Known Allergies VACCINATION STATUS:  There is no immunization history on file for this patient.  Diabetes  He presents for his follow-up diabetic visit. He has type 2 diabetes mellitus. Onset time: He was diagnosed at approximate age of 7 years. His disease course has been improving. There are no hypoglycemic associated symptoms. Pertinent negatives for hypoglycemia include no confusion, headaches, pallor or seizures. Pertinent negatives for diabetes include no chest pain, no fatigue, no polydipsia, no polyphagia, no polyuria and no weakness. There are no hypoglycemic complications. Symptoms are worsening. Diabetic complications include nephropathy. Risk factors for coronary artery disease include diabetes mellitus, dyslipidemia, hypertension, male sex, sedentary lifestyle and tobacco exposure. Current diabetic treatment includes oral agent (dual therapy) and insulin injections. His weight is fluctuating minimally. He is following a generally unhealthy diet. When asked about meal planning, he reported none. He has not had a previous visit with a dietitian. He never participates in exercise. His  breakfast blood glucose range is generally 140-180 mg/dl. His lunch blood glucose range is generally 140-180 mg/dl. His dinner blood glucose range is generally 140-180 mg/dl. His bedtime blood glucose range is generally 140-180 mg/dl. His overall blood glucose range is 140-180 mg/dl. An ACE inhibitor/angiotensin II receptor blocker is being taken. Eye exam is current.  Hyperlipidemia  This is a chronic problem. The current episode started more than 1 year ago. Exacerbating diseases include diabetes and obesity. Pertinent negatives include no chest pain, myalgias or shortness of breath. Current antihyperlipidemic treatment includes statins. Risk factors for coronary artery disease include dyslipidemia, hypertension, male sex, a sedentary lifestyle and obesity.  Hypertension  This is a chronic problem. The current episode started more than 1 year ago. The problem is uncontrolled. Pertinent negatives include no chest pain, headaches, neck pain, palpitations or shortness of breath. Risk factors for coronary artery  disease include dyslipidemia, diabetes mellitus, male gender, obesity and sedentary lifestyle. Past treatments include ACE inhibitors. Hypertensive end-organ damage includes kidney disease.     Review of Systems  Constitutional: Negative for chills, fatigue, fever and unexpected weight change.  HENT: Negative for dental problem, mouth sores and trouble swallowing.   Eyes: Negative for visual disturbance.  Respiratory: Negative for cough, choking, chest tightness, shortness of breath and wheezing.   Cardiovascular: Negative for chest pain, palpitations and leg swelling.  Gastrointestinal: Negative for abdominal distention, abdominal pain, constipation, diarrhea, nausea and vomiting.  Endocrine: Negative for polydipsia, polyphagia and polyuria.  Genitourinary: Negative for dysuria, flank pain, hematuria and urgency.  Musculoskeletal: Negative for back pain, gait problem, myalgias and neck pain.   Skin: Negative for pallor, rash and wound.  Neurological: Negative for seizures, syncope, weakness, numbness and headaches.  Psychiatric/Behavioral: Negative.  Negative for confusion and dysphoric mood.    Objective:    BP (!) 148/81   Pulse 86   Ht 6\' 1"  (1.854 m)   Wt 239 lb (108.4 kg)   BMI 31.53 kg/m   Wt Readings from Last 3 Encounters:  08/07/18 239 lb (108.4 kg)  07/08/18 241 lb (109.3 kg)  07/07/18 242 lb (109.8 kg)    Physical Exam  Constitutional: He is oriented to person, place, and time. He appears well-developed. He is cooperative. No distress.  HENT:  Head: Normocephalic and atraumatic.  Eyes: EOM are normal.  Neck: Normal range of motion. Neck supple. No tracheal deviation present. No thyromegaly present.  Cardiovascular: Normal rate, S1 normal and S2 normal. Exam reveals no gallop.  No murmur heard. Pulses:      Dorsalis pedis pulses are 1+ on the right side, and 1+ on the left side.       Posterior tibial pulses are 1+ on the right side, and 1+ on the left side.  Pulmonary/Chest: Effort normal. No respiratory distress. He has no wheezes.  Abdominal: He exhibits no distension. There is no tenderness. There is no guarding and no CVA tenderness.  Musculoskeletal: He exhibits no edema.       Right shoulder: He exhibits no swelling and no deformity.  Neurological: He is alert and oriented to person, place, and time. He has normal strength. No cranial nerve deficit or sensory deficit. Gait normal.  Skin: Skin is warm and dry. No rash noted. No cyanosis. Nails show no clubbing.  Psychiatric: He has a normal mood and affect. His speech is normal. Judgment normal. Cognition and memory are normal.   CMP     Component Value Date/Time   NA 139 07/31/2018 0936   K 4.0 07/31/2018 0936   CL 104 07/31/2018 0936   CO2 27 07/31/2018 0936   GLUCOSE 83 07/31/2018 0936   BUN 19 07/31/2018 0936   CREATININE 1.45 (H) 07/31/2018 0936   CALCIUM 9.7 07/31/2018 0936   PROT 6.5  07/31/2018 0936   ALBUMIN 4.1 05/27/2017 0823   AST 13 07/31/2018 0936   ALT 13 07/31/2018 0936   ALKPHOS 43 05/27/2017 0823   BILITOT 0.5 07/31/2018 0936   GFRNONAA 51 (L) 07/31/2018 0936   GFRAA 59 (L) 07/31/2018 0936     Diabetic Labs (most recent): Lab Results  Component Value Date   HGBA1C 9.2 (H) 07/31/2018   HGBA1C 7.1 (H) 01/28/2018   HGBA1C 7.0 (H) 10/01/2017     Assessment & Plan:   1. Uncontrolled type 2 diabetes mellitus with worsening chronic kidney disease.  - Patient has currently  uncontrolled symptomatic type 2 DM since  62 years of age. - He recently had a course of severe hyperglycemia which led to higher A1c of 9.2%, increasing from 7.1%.    - Recent labs reviewed.   His diabetes is complicated by stage 2-3 CK D and patient remains at a high risk for more acute and chronic complications of diabetes which include CAD, CVA, CKD, retinopathy, and neuropathy. These are all discussed in detail with the patient.  - I have counseled the patient on diet management and weight loss, by adopting a carbohydrate restricted/protein rich diet.  -  Suggestion is made for him to avoid simple carbohydrates  from his diet including Cakes, Sweet Desserts / Pastries, Ice Cream, Soda (diet and regular), Sweet Tea, Candies, Chips, Cookies, Store Bought Juices, Alcohol in Excess of  1-2 drinks a day, Artificial Sweeteners, and "Sugar-free" Products. This will help patient to have stable blood glucose profile and potentially avoid unintended weight gain.   - I encouraged the patient to switch to  unprocessed or minimally processed complex starch and increased protein intake (animal or plant source), fruits, and vegetables.  - Patient is advised to stick to a routine mealtimes to eat 3 meals  a day and avoid unnecessary snacks ( to snack only to correct hypoglycemia).   - I have approached patient with the following individualized plan to manage diabetes and patient agrees:   -He  continues to have above target glycemic profile, although significantly improving.   -He has some room on basal insulin before considering prandial insulin. -I discussed and increase his Tyler Aas to 70 units nightly associated with monitoring of blood glucose 2 times a day- before breakfast  and at bedtime .  -Patient is encouraged to call clinic for blood glucose levels less than 70 or above 200 mg /dl.  - His labs show improving renal function, he will continue to benefit from metformin therapy.  I advised him to continue metformin 500 mg p.o. twice daily- daily after breakfast and supper.    - He is advised to avoid any over-the-counter NSAIDs and avoid dehydration.    - Patient specific target  A1c;  LDL, HDL, Triglycerides, and  Waist Circumference were discussed in detail.  2) BP/HTN: His blood pressure is not controlled to target.  He is advised to continue his current blood pressure medications including amlodipine 10 mg p.o. daily, ramipril 5 mg p.o. daily, Bystolic 5 mg p.o. daily.     3) Lipids/HPL: No recent lipid panel to review.  He is advised to continue his atorvastatin 20 mg p.o. nightly. He will be considered for fasting lipid panel on subsequent visits.   4)  Weight/Diet: CDE Consult has been initiated , exercise, and detailed carbohydrates information provided.  5) Chronic Care/Health Maintenance:  -Patient is on ACEI/ARB and Statin medications and encouraged to continue to follow up with Ophthalmology, Podiatrist at least yearly or according to recommendations, and advised to   stay away from smoking. I have recommended yearly flu vaccine and pneumonia vaccination at least every 5 years; moderate intensity exercise for up to 150 minutes weekly; and  sleep for at least 7 hours a day.  - I advised patient to maintain close follow up with Sharilyn Sites, MD for primary care needs.  - Time spent with the patient: 25 min, of which >50% was spent in reviewing his blood glucose  logs , discussing his hypo- and hyper-glycemic episodes, reviewing his current and  previous labs and insulin  doses and developing a plan to avoid hypo- and hyper-glycemia. Please refer to Patient Instructions for Blood Glucose Monitoring and Insulin/Medications Dosing Guide"  in media tab for additional information. Merrily Brittle. participated in the discussions, expressed understanding, and voiced agreement with the above plans.  All questions were answered to his satisfaction. he is encouraged to contact clinic should he have any questions or concerns prior to his return visit.  Follow up plan: - Return in about 4 months (around 12/07/2018) for Follow up with Pre-visit Labs, Meter, and Logs.  Glade Lloyd, MD Phone: [phone]  Fax: [phone]  -  This note was partially dictated with voice recognition software. Similar sounding words can be transcribed inadequately or may not  be corrected upon review.  08/07/2018, 4:56 PM

## 2018-08-13 DIAGNOSIS — R809 Proteinuria, unspecified: Secondary | ICD-10-CM | POA: Diagnosis not present

## 2018-08-13 DIAGNOSIS — N183 Chronic kidney disease, stage 3 (moderate): Secondary | ICD-10-CM | POA: Diagnosis not present

## 2018-08-13 DIAGNOSIS — D649 Anemia, unspecified: Secondary | ICD-10-CM | POA: Diagnosis not present

## 2018-08-13 DIAGNOSIS — C9211 Chronic myeloid leukemia, BCR/ABL-positive, in remission: Secondary | ICD-10-CM | POA: Diagnosis not present

## 2018-08-18 DIAGNOSIS — C9210 Chronic myeloid leukemia, BCR/ABL-positive, not having achieved remission: Secondary | ICD-10-CM | POA: Diagnosis not present

## 2018-08-18 DIAGNOSIS — C9211 Chronic myeloid leukemia, BCR/ABL-positive, in remission: Secondary | ICD-10-CM | POA: Diagnosis not present

## 2018-08-30 DIAGNOSIS — M170 Bilateral primary osteoarthritis of knee: Secondary | ICD-10-CM | POA: Diagnosis not present

## 2018-08-30 DIAGNOSIS — M1712 Unilateral primary osteoarthritis, left knee: Secondary | ICD-10-CM | POA: Diagnosis not present

## 2018-08-30 DIAGNOSIS — M25562 Pain in left knee: Secondary | ICD-10-CM | POA: Diagnosis not present

## 2018-08-30 DIAGNOSIS — M545 Low back pain: Secondary | ICD-10-CM | POA: Diagnosis not present

## 2018-09-09 ENCOUNTER — Other Ambulatory Visit: Payer: Self-pay

## 2018-09-09 MED ORDER — INSULIN DEGLUDEC 100 UNIT/ML ~~LOC~~ SOPN: 70.0000 [IU] | PEN_INJECTOR | Freq: Every day | SUBCUTANEOUS | 2 refills | Status: DC

## 2018-09-09 MED ORDER — AMLODIPINE BESYLATE 5 MG PO TABS: 5.0000 mg | ORAL_TABLET | Freq: Every day | ORAL | 3 refills | Status: DC

## 2018-09-11 DIAGNOSIS — N189 Chronic kidney disease, unspecified: Secondary | ICD-10-CM | POA: Diagnosis not present

## 2018-09-11 DIAGNOSIS — Z23 Encounter for immunization: Secondary | ICD-10-CM | POA: Diagnosis not present

## 2018-09-11 DIAGNOSIS — M25561 Pain in right knee: Secondary | ICD-10-CM | POA: Diagnosis not present

## 2018-09-11 DIAGNOSIS — M62838 Other muscle spasm: Secondary | ICD-10-CM | POA: Diagnosis not present

## 2018-09-11 DIAGNOSIS — C9211 Chronic myeloid leukemia, BCR/ABL-positive, in remission: Secondary | ICD-10-CM | POA: Diagnosis not present

## 2018-09-11 DIAGNOSIS — G8929 Other chronic pain: Secondary | ICD-10-CM | POA: Diagnosis not present

## 2018-09-15 DIAGNOSIS — N183 Chronic kidney disease, stage 3 (moderate): Secondary | ICD-10-CM | POA: Diagnosis not present

## 2018-09-15 DIAGNOSIS — E1122 Type 2 diabetes mellitus with diabetic chronic kidney disease: Secondary | ICD-10-CM | POA: Diagnosis not present

## 2018-09-15 DIAGNOSIS — C9210 Chronic myeloid leukemia, BCR/ABL-positive, not having achieved remission: Secondary | ICD-10-CM | POA: Diagnosis not present

## 2018-09-15 DIAGNOSIS — N529 Male erectile dysfunction, unspecified: Secondary | ICD-10-CM | POA: Diagnosis not present

## 2018-09-15 DIAGNOSIS — Z4823 Encounter for aftercare following liver transplant: Secondary | ICD-10-CM | POA: Diagnosis not present

## 2018-09-15 DIAGNOSIS — E785 Hyperlipidemia, unspecified: Secondary | ICD-10-CM | POA: Diagnosis not present

## 2018-09-15 DIAGNOSIS — E1165 Type 2 diabetes mellitus with hyperglycemia: Secondary | ICD-10-CM | POA: Diagnosis not present

## 2018-09-15 DIAGNOSIS — C9211 Chronic myeloid leukemia, BCR/ABL-positive, in remission: Secondary | ICD-10-CM | POA: Diagnosis not present

## 2018-09-22 DIAGNOSIS — Z23 Encounter for immunization: Secondary | ICD-10-CM | POA: Diagnosis not present

## 2018-09-22 DIAGNOSIS — Z1389 Encounter for screening for other disorder: Secondary | ICD-10-CM | POA: Diagnosis not present

## 2018-09-22 DIAGNOSIS — G894 Chronic pain syndrome: Secondary | ICD-10-CM | POA: Diagnosis not present

## 2018-09-22 DIAGNOSIS — Z6833 Body mass index (BMI) 33.0-33.9, adult: Secondary | ICD-10-CM | POA: Diagnosis not present

## 2018-09-22 DIAGNOSIS — E6609 Other obesity due to excess calories: Secondary | ICD-10-CM | POA: Diagnosis not present

## 2018-09-22 DIAGNOSIS — E1140 Type 2 diabetes mellitus with diabetic neuropathy, unspecified: Secondary | ICD-10-CM | POA: Diagnosis not present

## 2018-10-11 DIAGNOSIS — M545 Low back pain: Secondary | ICD-10-CM | POA: Diagnosis not present

## 2018-10-11 DIAGNOSIS — M1712 Unilateral primary osteoarthritis, left knee: Secondary | ICD-10-CM | POA: Diagnosis not present

## 2018-10-14 ENCOUNTER — Other Ambulatory Visit: Payer: Self-pay | Admitting: Orthopedic Surgery

## 2018-10-15 ENCOUNTER — Telehealth: Payer: Self-pay | Admitting: Nurse Practitioner

## 2018-10-15 ENCOUNTER — Other Ambulatory Visit: Payer: Self-pay | Admitting: Orthopedic Surgery

## 2018-10-15 ENCOUNTER — Other Ambulatory Visit: Payer: Self-pay | Admitting: Physical Medicine and Rehabilitation

## 2018-10-15 DIAGNOSIS — M545 Low back pain, unspecified: Secondary | ICD-10-CM

## 2018-10-15 DIAGNOSIS — G8929 Other chronic pain: Secondary | ICD-10-CM

## 2018-10-15 NOTE — Telephone Encounter (Signed)
Phone call to patient to verify medication list and allergies for myelogram procedure. Pt aware he will not need to hold any medications for procedure.

## 2018-10-16 DIAGNOSIS — C9211 Chronic myeloid leukemia, BCR/ABL-positive, in remission: Secondary | ICD-10-CM | POA: Diagnosis not present

## 2018-10-20 DIAGNOSIS — M1712 Unilateral primary osteoarthritis, left knee: Secondary | ICD-10-CM | POA: Diagnosis not present

## 2018-10-20 DIAGNOSIS — C9211 Chronic myeloid leukemia, BCR/ABL-positive, in remission: Secondary | ICD-10-CM | POA: Diagnosis not present

## 2018-10-27 DIAGNOSIS — M1712 Unilateral primary osteoarthritis, left knee: Secondary | ICD-10-CM | POA: Diagnosis not present

## 2018-10-31 ENCOUNTER — Ambulatory Visit
Admission: RE | Admit: 2018-10-31 | Discharge: 2018-10-31 | Disposition: A | Discharge status: Home / Self Care | Payer: 59 | Source: Ambulatory Visit | Attending: Orthopedic Surgery | Admitting: Orthopedic Surgery

## 2018-10-31 DIAGNOSIS — M545 Low back pain, unspecified: Secondary | ICD-10-CM

## 2018-10-31 DIAGNOSIS — G8929 Other chronic pain: Secondary | ICD-10-CM

## 2018-10-31 DIAGNOSIS — M48061 Spinal stenosis, lumbar region without neurogenic claudication: Secondary | ICD-10-CM | POA: Diagnosis not present

## 2018-10-31 MED ORDER — DIAZEPAM 5 MG PO TABS
10.0000 mg | ORAL_TABLET | Freq: Once | ORAL | Status: AC
  Administered 2018-10-31: 10 mg via ORAL

## 2018-10-31 MED ORDER — MEPERIDINE HCL 100 MG/ML IJ SOLN
75.0000 mg | Freq: Once | INTRAMUSCULAR | Status: AC
  Administered 2018-10-31: 75 mg via INTRAMUSCULAR

## 2018-10-31 MED ORDER — ONDANSETRON HCL 4 MG/2ML IJ SOLN
4.0000 mg | Freq: Once | INTRAMUSCULAR | Status: AC
  Administered 2018-10-31: 4 mg via INTRAMUSCULAR

## 2018-10-31 MED ORDER — IOHEXOL 180 MG/ML  SOLN
20.0000 mL | Freq: Once | INTRAMUSCULAR | Status: AC | PRN
  Administered 2018-10-31: 20 mL via INTRAVENOUS

## 2018-10-31 NOTE — Discharge Instructions (Signed)

## 2018-11-08 DIAGNOSIS — M545 Low back pain: Secondary | ICD-10-CM | POA: Diagnosis not present

## 2018-11-13 DIAGNOSIS — C9211 Chronic myeloid leukemia, BCR/ABL-positive, in remission: Secondary | ICD-10-CM | POA: Diagnosis not present

## 2018-11-18 DIAGNOSIS — C9210 Chronic myeloid leukemia, BCR/ABL-positive, not having achieved remission: Secondary | ICD-10-CM | POA: Diagnosis not present

## 2018-11-18 DIAGNOSIS — C9211 Chronic myeloid leukemia, BCR/ABL-positive, in remission: Secondary | ICD-10-CM | POA: Diagnosis not present

## 2018-12-03 ENCOUNTER — Other Ambulatory Visit: Payer: Self-pay | Admitting: "Endocrinology

## 2018-12-08 ENCOUNTER — Ambulatory Visit: Payer: Medicare Other | Admitting: "Endocrinology

## 2018-12-08 DIAGNOSIS — M545 Low back pain: Secondary | ICD-10-CM | POA: Diagnosis not present

## 2018-12-08 DIAGNOSIS — M25569 Pain in unspecified knee: Secondary | ICD-10-CM | POA: Diagnosis not present

## 2018-12-11 DIAGNOSIS — Z79899 Other long term (current) drug therapy: Secondary | ICD-10-CM | POA: Diagnosis not present

## 2018-12-11 DIAGNOSIS — C9211 Chronic myeloid leukemia, BCR/ABL-positive, in remission: Secondary | ICD-10-CM | POA: Diagnosis not present

## 2018-12-11 DIAGNOSIS — N189 Chronic kidney disease, unspecified: Secondary | ICD-10-CM | POA: Diagnosis not present

## 2018-12-11 DIAGNOSIS — G8929 Other chronic pain: Secondary | ICD-10-CM | POA: Diagnosis not present

## 2018-12-17 DIAGNOSIS — Z794 Long term (current) use of insulin: Secondary | ICD-10-CM | POA: Diagnosis not present

## 2018-12-17 DIAGNOSIS — E1140 Type 2 diabetes mellitus with diabetic neuropathy, unspecified: Secondary | ICD-10-CM | POA: Diagnosis not present

## 2018-12-17 DIAGNOSIS — E1165 Type 2 diabetes mellitus with hyperglycemia: Secondary | ICD-10-CM | POA: Diagnosis not present

## 2018-12-17 DIAGNOSIS — I10 Essential (primary) hypertension: Secondary | ICD-10-CM | POA: Diagnosis not present

## 2018-12-17 DIAGNOSIS — D509 Iron deficiency anemia, unspecified: Secondary | ICD-10-CM | POA: Diagnosis not present

## 2018-12-17 DIAGNOSIS — R809 Proteinuria, unspecified: Secondary | ICD-10-CM | POA: Diagnosis not present

## 2018-12-17 DIAGNOSIS — N183 Chronic kidney disease, stage 3 (moderate): Secondary | ICD-10-CM | POA: Diagnosis not present

## 2018-12-17 DIAGNOSIS — E1122 Type 2 diabetes mellitus with diabetic chronic kidney disease: Secondary | ICD-10-CM | POA: Diagnosis not present

## 2018-12-17 DIAGNOSIS — E559 Vitamin D deficiency, unspecified: Secondary | ICD-10-CM | POA: Diagnosis not present

## 2018-12-18 DIAGNOSIS — C9211 Chronic myeloid leukemia, BCR/ABL-positive, in remission: Secondary | ICD-10-CM | POA: Diagnosis not present

## 2018-12-18 DIAGNOSIS — C9210 Chronic myeloid leukemia, BCR/ABL-positive, not having achieved remission: Secondary | ICD-10-CM | POA: Diagnosis not present

## 2018-12-18 LAB — COMPLETE METABOLIC PANEL WITH GFR
AG Ratio: 1.4 (calc) (ref 1.0–2.5)
ALBUMIN MSPROF: 3.7 g/dL (ref 3.6–5.1)
ALKALINE PHOSPHATASE (APISO): 65 U/L (ref 40–115)
ALT: 29 U/L (ref 9–46)
AST: 22 U/L (ref 10–35)
BILIRUBIN TOTAL: 0.4 mg/dL (ref 0.2–1.2)
BUN: 20 mg/dL (ref 7–25)
CO2: 28 mmol/L (ref 20–32)
CREATININE: 1.22 mg/dL (ref 0.70–1.25)
Calcium: 9.6 mg/dL (ref 8.6–10.3)
Chloride: 105 mmol/L (ref 98–110)
GFR, Est African American: 73 mL/min/{1.73_m2} (ref >=60)
GFR, Est Non African American: 63 mL/min/{1.73_m2} (ref >=60)
GLUCOSE: 134 mg/dL (ref 65–139)
Globulin: 2.7 g/dL (calc) (ref 1.9–3.7)
POTASSIUM: 4.3 mmol/L (ref 3.5–5.3)
Sodium: 139 mmol/L (ref 135–146)
Total Protein: 6.4 g/dL (ref 6.1–8.1)

## 2018-12-18 LAB — HEMOGLOBIN A1C
EAG (MMOL/L): 11.7 (calc)
HEMOGLOBIN A1C: 9 %{Hb} — AB (ref <5.7)
MEAN PLASMA GLUCOSE: 212 (calc)

## 2018-12-22 ENCOUNTER — Other Ambulatory Visit: Payer: Self-pay | Admitting: "Endocrinology

## 2018-12-23 DIAGNOSIS — Z1389 Encounter for screening for other disorder: Secondary | ICD-10-CM | POA: Diagnosis not present

## 2018-12-23 DIAGNOSIS — I10 Essential (primary) hypertension: Secondary | ICD-10-CM | POA: Diagnosis not present

## 2018-12-23 DIAGNOSIS — E7849 Other hyperlipidemia: Secondary | ICD-10-CM | POA: Diagnosis not present

## 2018-12-23 DIAGNOSIS — C9210 Chronic myeloid leukemia, BCR/ABL-positive, not having achieved remission: Secondary | ICD-10-CM | POA: Diagnosis not present

## 2018-12-24 DIAGNOSIS — N182 Chronic kidney disease, stage 2 (mild): Secondary | ICD-10-CM | POA: Diagnosis not present

## 2018-12-24 DIAGNOSIS — R809 Proteinuria, unspecified: Secondary | ICD-10-CM | POA: Diagnosis not present

## 2018-12-24 DIAGNOSIS — N2581 Secondary hyperparathyroidism of renal origin: Secondary | ICD-10-CM | POA: Diagnosis not present

## 2018-12-25 ENCOUNTER — Ambulatory Visit: Payer: Medicare Other | Admitting: "Endocrinology

## 2018-12-25 ENCOUNTER — Encounter: Payer: Self-pay | Admitting: "Endocrinology

## 2018-12-25 VITALS — BP 147/68 | HR 67 | Ht 73.0 in | Wt 246.0 lb

## 2018-12-25 DIAGNOSIS — Z794 Long term (current) use of insulin: Secondary | ICD-10-CM

## 2018-12-25 DIAGNOSIS — E1165 Type 2 diabetes mellitus with hyperglycemia: Secondary | ICD-10-CM | POA: Diagnosis not present

## 2018-12-25 DIAGNOSIS — E782 Mixed hyperlipidemia: Secondary | ICD-10-CM | POA: Diagnosis not present

## 2018-12-25 DIAGNOSIS — IMO0002 Reserved for concepts with insufficient information to code with codable children: Secondary | ICD-10-CM

## 2018-12-25 DIAGNOSIS — E1122 Type 2 diabetes mellitus with diabetic chronic kidney disease: Secondary | ICD-10-CM

## 2018-12-25 DIAGNOSIS — N183 Chronic kidney disease, stage 3 (moderate): Secondary | ICD-10-CM | POA: Diagnosis not present

## 2018-12-25 DIAGNOSIS — I10 Essential (primary) hypertension: Secondary | ICD-10-CM | POA: Diagnosis not present

## 2018-12-25 MED ORDER — INSULIN DEGLUDEC 200 UNIT/ML ~~LOC~~ SOPN: 80.0000 [IU] | PEN_INJECTOR | Freq: Every day | SUBCUTANEOUS | 3 refills | Status: DC

## 2018-12-25 NOTE — Progress Notes (Signed)
Endocrinology follow-up note  Subjective:    Patient ID: Dylan Llanas., male    DOB: March 19, 1956. Patient is being seen in follow-up   for management of currently uncontrolled, complicated type 2 diabetes requested by  Sharilyn Sites, MD  Past Medical History:  Diagnosis Date  . Chronic back pain   . CML (chronic myelocytic leukemia) ([place])   . Diabetes mellitus without complication ([place])   . History of stress test    myoview study suggested the possibility of basal inferior wall ischemia which was present in all 3 tonographic views.  [name] Hx of echocardiogram 05/2012   EF 55%Mildly hypertrophic left ventricle with normal systolic function and early signs of mild diastolic dysfuntion. Moderately dilated left atrium, mild aortic valve sclerosis with out stenosis.  . Hyperlipemia   . Hypertension   . Leukemia [facility])    Past Surgical History:  Procedure Laterality Date  . BACK SURGERY  12/2006  . CARDIAC CATHETERIZATION  2010   revealed mild nonobstructive CAD  . CARDIAC CATHETERIZATION  07/2012   This showed essentially normal coronary arteries without obstruction. there was normal LV function.  . COLONOSCOPY WITH PROPOFOL N/A 07/14/2018   Procedure: COLONOSCOPY WITH PROPOFOL;  Surgeon: Daneil Dolin, MD;  Location: [facility];  Service: Endoscopy;  Laterality: N/A;  10:45am  . KNEE SURGERY Left 01/1994  . LEFT HEART CATHETERIZATION WITH CORONARY ANGIOGRAM N/A 07/29/2012   Procedure: LEFT HEART CATHETERIZATION WITH CORONARY ANGIOGRAM;  Surgeon: Troy Sine, MD;  Location: [facility] CATH LAB;  Service: Cardiovascular;  Laterality: N/A;  . POLYPECTOMY  07/14/2018   Procedure: POLYPECTOMY;  Surgeon: Daneil Dolin, MD;  Location: [facility];  Service: Endoscopy;;  colon   Social History   Socioeconomic History  . Marital status: Married    Spouse name: Not on file  . Number of children: Not on file  . Years of education: Not on file  . Highest education level: Not on file   Occupational History  . Not on file  Social Needs  . Financial resource strain: Not on file  . Food insecurity:    Worry: Not on file    Inability: Not on file  . Transportation needs:    Medical: Not on file    Non-medical: Not on file  Tobacco Use  . Smoking status: Former Smoker    Packs/day: 0.25    Years: 15.00    Pack years: 3.75    Types: Cigarettes    Last attempt to quit: 07/08/1987    Years since quitting: 31.4  . Smokeless tobacco: Never Used  . Tobacco comment: "30 years ago"  Substance and Sexual Activity  . Alcohol use: Yes    Comment: "very seldom"  . Drug use: No  . Sexual activity: Yes    Birth control/protection: None  Lifestyle  . Physical activity:    Days per week: Not on file    Minutes per session: Not on file  . Stress: Not on file  Relationships  . Social connections:    Talks on phone: Not on file    Gets together: Not on file    Attends religious service: Not on file    Active member of club or organization: Not on file    Attends meetings of clubs or organizations: Not on file    Relationship status: Not on file  Other Topics Concern  . Not on file  Social History Narrative  . Not on file   Outpatient Encounter  Medications as of 12/25/2018  Medication Sig  . ACCU-CHEK AVIVA PLUS test strip USE TO TEST 3 TIMES DAILY.  [name] ACCU-CHEK SOFTCLIX LANCETS lancets USE TO TEST 3 TIMES DAILY.  [name] amLODipine (NORVASC) 5 MG tablet TAKE 1 TABLET BY MOUTH ONCE A DAY FOR BLOOD PRESSURE.  [name] aspirin EC 81 MG tablet Take 81 mg by mouth daily.  [name] atorvastatin (LIPITOR) 20 MG tablet Take 20 mg by mouth at bedtime.   . B-D ULTRAFINE III SHORT PEN 31G X 8 MM MISC USING WITH TOUJEO  . Insulin Degludec (TRESIBA FLEXTOUCH) 200 UNIT/ML SOPN Inject 80 Units into the skin at bedtime.  . metFORMIN (GLUCOPHAGE) 500 MG tablet Take by mouth 2 (two) times daily.  . Multiple Vitamin (MULTIVITAMIN WITH MINERALS) TABS Take 1 tablet by mouth daily.  . nebivolol (BYSTOLIC) 5  MG tablet Take 5 mg by mouth daily.  . Oxycodone HCl 10 MG TABS Take 10 mg by mouth daily as needed (for pain.).   [name] ramipril (ALTACE) 5 MG capsule TAKE (1) CAPSULE BY MOUTH ONCE DAILY.  . [DISCONTINUED] insulin degludec (TRESIBA FLEXTOUCH) 100 UNIT/ML SOPN FlexTouch Pen Inject 0.7 mLs (70 Units total) into the skin at bedtime.   No facility-administered encounter medications on file as of 12/25/2018.    ALLERGIES: No Known Allergies VACCINATION STATUS:  There is no immunization history on file for this patient.  Diabetes  He presents for his follow-up diabetic visit. He has type 2 diabetes mellitus. Onset time: He was diagnosed at approximate age of 7 years. His disease course has been stable. There are no hypoglycemic associated symptoms. Pertinent negatives for hypoglycemia include no confusion, headaches, pallor or seizures. Pertinent negatives for diabetes include no chest pain, no fatigue, no polydipsia, no polyphagia, no polyuria and no weakness. There are no hypoglycemic complications. Symptoms are improving. Diabetic complications include nephropathy. Risk factors for coronary artery disease include diabetes mellitus, dyslipidemia, hypertension, male sex, sedentary lifestyle and tobacco exposure. Current diabetic treatment includes oral agent (dual therapy) and insulin injections. His weight is increasing steadily. He is following a generally unhealthy diet. When asked about meal planning, he reported none. He has not had a previous visit with a dietitian. He never participates in exercise. His home blood glucose trend is decreasing steadily. His breakfast blood glucose range is generally 140-180 mg/dl. His bedtime blood glucose range is generally 180-200 mg/dl. His overall blood glucose range is 180-200 mg/dl. An ACE inhibitor/angiotensin II receptor blocker is being taken. Eye exam is current.  Hyperlipidemia  This is a chronic problem. The current episode started more than 1 year ago.  Exacerbating diseases include diabetes and obesity. Pertinent negatives include no chest pain, myalgias or shortness of breath. Current antihyperlipidemic treatment includes statins. Risk factors for coronary artery disease include dyslipidemia, hypertension, male sex, a sedentary lifestyle and obesity.  Hypertension  This is a chronic problem. The current episode started more than 1 year ago. The problem is uncontrolled. Pertinent negatives include no chest pain, headaches, neck pain, palpitations or shortness of breath. Risk factors for coronary artery disease include dyslipidemia, diabetes mellitus, male gender, obesity and sedentary lifestyle. Past treatments include ACE inhibitors. Hypertensive end-organ damage includes kidney disease.     Review of Systems  Constitutional: Negative for chills, fatigue, fever and unexpected weight change.  HENT: Negative for dental problem, mouth sores and trouble swallowing.   Eyes: Negative for visual disturbance.  Respiratory: Negative for cough, choking, chest tightness, shortness of breath and wheezing.   Cardiovascular:  Negative for chest pain, palpitations and leg swelling.  Gastrointestinal: Negative for abdominal distention, abdominal pain, constipation, diarrhea, nausea and vomiting.  Endocrine: Negative for polydipsia, polyphagia and polyuria.  Genitourinary: Negative for dysuria, flank pain, hematuria and urgency.  Musculoskeletal: Negative for back pain, gait problem, myalgias and neck pain.  Skin: Negative for pallor, rash and wound.  Neurological: Negative for seizures, syncope, weakness, numbness and headaches.  Psychiatric/Behavioral: Negative.  Negative for confusion and dysphoric mood.    Objective:    BP (!) 147/68   Pulse 67   Ht 6\' 1"  (1.854 m)   Wt 246 lb (111.6 kg)   BMI 32.46 kg/m   Wt Readings from Last 3 Encounters:  12/25/18 246 lb (111.6 kg)  08/07/18 239 lb (108.4 kg)  07/08/18 241 lb (109.3 kg)    Physical Exam   Constitutional: He is oriented to person, place, and time. He appears well-developed. He is cooperative. No distress.  HENT:  Head: Normocephalic and atraumatic.  Eyes: EOM are normal.  Neck: Normal range of motion. Neck supple. No tracheal deviation present. No thyromegaly present.  Cardiovascular: Normal rate, S1 normal and S2 normal. Exam reveals no gallop.  No murmur heard. Pulses:      Dorsalis pedis pulses are 1+ on the right side and 1+ on the left side.       Posterior tibial pulses are 1+ on the right side and 1+ on the left side.  Pulmonary/Chest: Effort normal. No respiratory distress. He has no wheezes.  Abdominal: He exhibits no distension. There is no abdominal tenderness. There is no guarding and no CVA tenderness.  Musculoskeletal:        General: No edema.     Right shoulder: He exhibits no swelling and no deformity.  Neurological: He is alert and oriented to person, place, and time. He has normal strength. No cranial nerve deficit or sensory deficit. Gait normal.  Skin: Skin is warm and dry. No rash noted. No cyanosis. Nails show no clubbing.  Psychiatric: He has a normal mood and affect. His speech is normal. Judgment normal. Cognition and memory are normal.   CMP     Component Value Date/Time   NA 139 12/17/2018 0952   K 4.3 12/17/2018 0952   CL 105 12/17/2018 0952   CO2 28 12/17/2018 0952   GLUCOSE 134 12/17/2018 0952   BUN 20 12/17/2018 0952   CREATININE 1.22 12/17/2018 0952   CALCIUM 9.6 12/17/2018 0952   PROT 6.4 12/17/2018 0952   ALBUMIN 4.1 05/27/2017 0823   AST 22 12/17/2018 0952   ALT 29 12/17/2018 0952   ALKPHOS 43 05/27/2017 0823   BILITOT 0.4 12/17/2018 0952   GFRNONAA 63 12/17/2018 0952   GFRAA 73 12/17/2018 0952     Diabetic Labs (most recent): Lab Results  Component Value Date   HGBA1C 9.0 (H) 12/17/2018   HGBA1C 9.2 (H) 07/31/2018   HGBA1C 7.1 (H) 01/28/2018     Assessment & Plan:   1. Uncontrolled type 2 diabetes mellitus with  worsening chronic kidney disease.  - Patient has currently uncontrolled symptomatic type 2 DM since  63 years of age. - He recently had a course of severe hyperglycemia which led to higher A1c , still at 9%.    - Recent labs reviewed.   His diabetes is complicated by stage 2-3 CK D and patient remains at a high risk for more acute and chronic complications of diabetes which include CAD, CVA, CKD, retinopathy, and neuropathy. These are  all discussed in detail with the patient.  - I have counseled the patient on diet management and weight loss, by adopting a carbohydrate restricted/protein rich diet.  - Patient admits there is a room for improvement in his diet and drink choices. -  Suggestion is made for him to avoid simple carbohydrates  from his diet including Cakes, Sweet Desserts / Pastries, Ice Cream, Soda (diet and regular), Sweet Tea, Candies, Chips, Cookies, Store Bought Juices, Alcohol in Excess of  1-2 drinks a day, Artificial Sweeteners, and "Sugar-free" Products. This will help patient to have stable blood glucose profile and potentially avoid unintended weight gain.   - I encouraged the patient to switch to  unprocessed or minimally processed complex starch and increased protein intake (animal or plant source), fruits, and vegetables.  - Patient is advised to stick to a routine mealtimes to eat 3 meals  a day and avoid unnecessary snacks ( to snack only to correct hypoglycemia).   - I have approached patient with the following individualized plan to manage diabetes and patient agrees:   -He continues to have above target glycemic profile, although significantly improving.   -He has some room on basal insulin before considering prandial insulin. -I discussed and increased his Tyler Aas to 80 units nightly associated with monitoring of blood glucose 2 times a day- before breakfast  and at bedtime .  -Patient is encouraged to call clinic for blood glucose levels less than 70 or above  200 mg /dl.  - His labs show improving renal function, he will continue to benefit from metformin therapy.  He is advised to continue metformin  500 mg p.o. twice daily- daily after breakfast and supper.    - He is advised to avoid any over-the-counter NSAIDs and avoid dehydration.    - Patient specific target  A1c;  LDL, HDL, Triglycerides, and  Waist Circumference were discussed in detail.  2) BP/HTN: His blood pressure is not controlled to target.  He is advised to continue his current blood pressure medications including amlodipine 10 mg p.o. daily, ramipril 5 mg p.o. daily, Bystolic 5 mg p.o. daily.     3) Lipids/HPL: No recent lipid panel to review.  He is advised to continue his atorvastatin 20 mg p.o. nightly. He will be considered for fasting lipid panel on subsequent visits.   4)  Weight/Diet: CDE Consult has been initiated , exercise, and detailed carbohydrates information provided.  5) Chronic Care/Health Maintenance:  -Patient is on ACEI/ARB and Statin medications and encouraged to continue to follow up with Ophthalmology, Podiatrist at least yearly or according to recommendations, and advised to   stay away from smoking. I have recommended yearly flu vaccine and pneumonia vaccination at least every 5 years; moderate intensity exercise for up to 150 minutes weekly; and  sleep for at least 7 hours a day.  - I advised patient to maintain close follow up with Sharilyn Sites, MD for primary care needs.  - Time spent with the patient: 25 min, of which >50% was spent in reviewing his blood glucose logs , discussing his hypoglycemia and hyperglycemia episodes, reviewing his current and  previous labs / studies and medications  doses and developing a plan to avoid hypoglycemia and hyperglycemia. Please refer to Patient Instructions for Blood Glucose Monitoring and Insulin/Medications Dosing Guide"  in media tab for additional information. Merrily Brittle. participated in the discussions,  expressed understanding, and voiced agreement with the above plans.  All questions were answered to his satisfaction.  he is encouraged to contact clinic should he have any questions or concerns prior to his return visit.   Follow up plan: - Return in about 4 months (around 04/25/2019) for Meter, and Logs.  Glade Lloyd, MD Phone: [phone]  Fax: [phone]  -  This note was partially dictated with voice recognition software. Similar sounding words can be transcribed inadequately or may not  be corrected upon review.  12/25/2018, 3:39 PM

## 2018-12-25 NOTE — Patient Instructions (Signed)

## 2019-01-07 DIAGNOSIS — R809 Proteinuria, unspecified: Secondary | ICD-10-CM | POA: Diagnosis not present

## 2019-01-07 DIAGNOSIS — I10 Essential (primary) hypertension: Secondary | ICD-10-CM | POA: Diagnosis not present

## 2019-01-07 DIAGNOSIS — N182 Chronic kidney disease, stage 2 (mild): Secondary | ICD-10-CM | POA: Diagnosis not present

## 2019-01-15 DIAGNOSIS — C9211 Chronic myeloid leukemia, BCR/ABL-positive, in remission: Secondary | ICD-10-CM | POA: Diagnosis not present

## 2019-01-16 DIAGNOSIS — C9211 Chronic myeloid leukemia, BCR/ABL-positive, in remission: Secondary | ICD-10-CM | POA: Diagnosis not present

## 2019-01-29 DIAGNOSIS — I10 Essential (primary) hypertension: Secondary | ICD-10-CM | POA: Diagnosis not present

## 2019-01-29 DIAGNOSIS — Z79899 Other long term (current) drug therapy: Secondary | ICD-10-CM | POA: Diagnosis not present

## 2019-01-29 DIAGNOSIS — D649 Anemia, unspecified: Secondary | ICD-10-CM | POA: Diagnosis not present

## 2019-01-29 DIAGNOSIS — N183 Chronic kidney disease, stage 3 (moderate): Secondary | ICD-10-CM | POA: Diagnosis not present

## 2019-01-29 DIAGNOSIS — R809 Proteinuria, unspecified: Secondary | ICD-10-CM | POA: Diagnosis not present

## 2019-02-04 DIAGNOSIS — N182 Chronic kidney disease, stage 2 (mild): Secondary | ICD-10-CM | POA: Diagnosis not present

## 2019-02-04 DIAGNOSIS — R809 Proteinuria, unspecified: Secondary | ICD-10-CM | POA: Diagnosis not present

## 2019-02-06 DIAGNOSIS — Z1159 Encounter for screening for other viral diseases: Secondary | ICD-10-CM | POA: Diagnosis not present

## 2019-02-06 DIAGNOSIS — N183 Chronic kidney disease, stage 3 (moderate): Secondary | ICD-10-CM | POA: Diagnosis not present

## 2019-02-06 DIAGNOSIS — R809 Proteinuria, unspecified: Secondary | ICD-10-CM | POA: Diagnosis not present

## 2019-02-06 DIAGNOSIS — Z79899 Other long term (current) drug therapy: Secondary | ICD-10-CM | POA: Diagnosis not present

## 2019-02-06 DIAGNOSIS — I10 Essential (primary) hypertension: Secondary | ICD-10-CM | POA: Diagnosis not present

## 2019-02-12 DIAGNOSIS — C9211 Chronic myeloid leukemia, BCR/ABL-positive, in remission: Secondary | ICD-10-CM | POA: Diagnosis not present

## 2019-02-16 DIAGNOSIS — C9211 Chronic myeloid leukemia, BCR/ABL-positive, in remission: Secondary | ICD-10-CM | POA: Diagnosis not present

## 2019-02-16 DIAGNOSIS — C9210 Chronic myeloid leukemia, BCR/ABL-positive, not having achieved remission: Secondary | ICD-10-CM | POA: Diagnosis not present

## 2019-02-27 ENCOUNTER — Ambulatory Visit: Payer: Medicare Other | Admitting: Cardiovascular Disease

## 2019-03-02 ENCOUNTER — Other Ambulatory Visit: Payer: Self-pay

## 2019-03-02 MED ORDER — METFORMIN HCL 500 MG PO TABS: 500.0000 mg | ORAL_TABLET | Freq: Two times a day (BID) | ORAL | 0 refills | Status: DC

## 2019-03-17 ENCOUNTER — Other Ambulatory Visit: Payer: Self-pay | Admitting: "Endocrinology

## 2019-03-17 ENCOUNTER — Other Ambulatory Visit: Payer: Self-pay | Admitting: Cardiovascular Disease

## 2019-03-17 NOTE — Telephone Encounter (Signed)
Bystolic 10 mg refilled. I called patient to confirm he is taking 10 mg. At his last visit he reported he was taking 5 mg.he did confirm he is taking 10 mg. Message sent to keep July Appointment.

## 2019-03-18 ENCOUNTER — Other Ambulatory Visit (HOSPITAL_COMMUNITY): Payer: Self-pay | Admitting: Nephrology

## 2019-03-18 DIAGNOSIS — I10 Essential (primary) hypertension: Secondary | ICD-10-CM

## 2019-03-18 DIAGNOSIS — R809 Proteinuria, unspecified: Secondary | ICD-10-CM

## 2019-03-18 DIAGNOSIS — N182 Chronic kidney disease, stage 2 (mild): Secondary | ICD-10-CM

## 2019-03-19 DIAGNOSIS — C9211 Chronic myeloid leukemia, BCR/ABL-positive, in remission: Secondary | ICD-10-CM | POA: Diagnosis not present

## 2019-03-24 DIAGNOSIS — C9211 Chronic myeloid leukemia, BCR/ABL-positive, in remission: Secondary | ICD-10-CM | POA: Diagnosis not present

## 2019-04-16 DIAGNOSIS — C9211 Chronic myeloid leukemia, BCR/ABL-positive, in remission: Secondary | ICD-10-CM | POA: Diagnosis not present

## 2019-04-21 ENCOUNTER — Other Ambulatory Visit: Payer: Self-pay | Admitting: "Endocrinology

## 2019-04-21 DIAGNOSIS — Z794 Long term (current) use of insulin: Secondary | ICD-10-CM | POA: Diagnosis not present

## 2019-04-21 DIAGNOSIS — E1165 Type 2 diabetes mellitus with hyperglycemia: Secondary | ICD-10-CM | POA: Diagnosis not present

## 2019-04-21 DIAGNOSIS — E1122 Type 2 diabetes mellitus with diabetic chronic kidney disease: Secondary | ICD-10-CM | POA: Diagnosis not present

## 2019-04-21 DIAGNOSIS — E119 Type 2 diabetes mellitus without complications: Secondary | ICD-10-CM | POA: Diagnosis not present

## 2019-04-21 DIAGNOSIS — N183 Chronic kidney disease, stage 3 (moderate): Secondary | ICD-10-CM | POA: Diagnosis not present

## 2019-04-22 LAB — COMPREHENSIVE METABOLIC PANEL
ALT: 20 IU/L (ref 0–44)
AST: 14 IU/L (ref 0–40)
Albumin/Globulin Ratio: 1.6 (ref 1.2–2.2)
Albumin: 4.1 g/dL (ref 3.8–4.8)
Alkaline Phosphatase: 92 IU/L (ref 39–117)
BUN/Creatinine Ratio: 11 (ref 10–24)
BUN: 15 mg/dL (ref 8–27)
Bilirubin Total: 0.3 mg/dL (ref 0.0–1.2)
CO2: 22 mmol/L (ref 20–29)
Calcium: 10.1 mg/dL (ref 8.6–10.2)
Chloride: 102 mmol/L (ref 96–106)
Creatinine, Ser: 1.41 mg/dL — ABNORMAL HIGH (ref 0.76–1.27)
GFR calc Af Amer: 60 mL/min/{1.73_m2} (ref >59)
GFR calc non Af Amer: 52 mL/min/{1.73_m2} — ABNORMAL LOW (ref >59)
Globulin, Total: 2.6 g/dL (ref 1.5–4.5)
Glucose: 159 mg/dL — ABNORMAL HIGH (ref 65–99)
Potassium: 4.4 mmol/L (ref 3.5–5.2)
Sodium: 139 mmol/L (ref 134–144)
Total Protein: 6.7 g/dL (ref 6.0–8.5)

## 2019-04-22 LAB — HGB A1C W/O EAG: Hgb A1c MFr Bld: 10.2 % — ABNORMAL HIGH (ref 4.8–5.6)

## 2019-04-22 LAB — SPECIMEN STATUS REPORT

## 2019-04-23 ENCOUNTER — Other Ambulatory Visit: Payer: Self-pay | Admitting: "Endocrinology

## 2019-04-23 DIAGNOSIS — C9211 Chronic myeloid leukemia, BCR/ABL-positive, in remission: Secondary | ICD-10-CM | POA: Diagnosis not present

## 2019-04-27 DIAGNOSIS — K219 Gastro-esophageal reflux disease without esophagitis: Secondary | ICD-10-CM | POA: Diagnosis not present

## 2019-04-27 DIAGNOSIS — C9210 Chronic myeloid leukemia, BCR/ABL-positive, not having achieved remission: Secondary | ICD-10-CM | POA: Diagnosis not present

## 2019-04-27 DIAGNOSIS — E1140 Type 2 diabetes mellitus with diabetic neuropathy, unspecified: Secondary | ICD-10-CM | POA: Diagnosis not present

## 2019-04-27 DIAGNOSIS — E785 Hyperlipidemia, unspecified: Secondary | ICD-10-CM | POA: Diagnosis not present

## 2019-04-27 DIAGNOSIS — Z1389 Encounter for screening for other disorder: Secondary | ICD-10-CM | POA: Diagnosis not present

## 2019-04-27 DIAGNOSIS — Z0001 Encounter for general adult medical examination with abnormal findings: Secondary | ICD-10-CM | POA: Diagnosis not present

## 2019-04-27 DIAGNOSIS — R7989 Other specified abnormal findings of blood chemistry: Secondary | ICD-10-CM | POA: Diagnosis not present

## 2019-04-27 DIAGNOSIS — N183 Chronic kidney disease, stage 3 (moderate): Secondary | ICD-10-CM | POA: Diagnosis not present

## 2019-04-27 DIAGNOSIS — G8929 Other chronic pain: Secondary | ICD-10-CM | POA: Diagnosis not present

## 2019-04-29 ENCOUNTER — Ambulatory Visit (INDEPENDENT_AMBULATORY_CARE_PROVIDER_SITE_OTHER): Payer: Medicare Other | Admitting: "Endocrinology

## 2019-04-29 ENCOUNTER — Other Ambulatory Visit: Payer: Self-pay | Admitting: "Endocrinology

## 2019-04-29 ENCOUNTER — Other Ambulatory Visit: Payer: Self-pay

## 2019-04-29 ENCOUNTER — Encounter: Payer: Self-pay | Admitting: "Endocrinology

## 2019-04-29 DIAGNOSIS — I10 Essential (primary) hypertension: Secondary | ICD-10-CM

## 2019-04-29 DIAGNOSIS — IMO0002 Reserved for concepts with insufficient information to code with codable children: Secondary | ICD-10-CM

## 2019-04-29 DIAGNOSIS — N183 Chronic kidney disease, stage 3 (moderate): Secondary | ICD-10-CM

## 2019-04-29 DIAGNOSIS — E782 Mixed hyperlipidemia: Secondary | ICD-10-CM | POA: Diagnosis not present

## 2019-04-29 DIAGNOSIS — E1122 Type 2 diabetes mellitus with diabetic chronic kidney disease: Secondary | ICD-10-CM | POA: Diagnosis not present

## 2019-04-29 DIAGNOSIS — E1165 Type 2 diabetes mellitus with hyperglycemia: Secondary | ICD-10-CM | POA: Diagnosis not present

## 2019-04-29 DIAGNOSIS — Z794 Long term (current) use of insulin: Secondary | ICD-10-CM

## 2019-04-29 MED ORDER — INSULIN ASPART 100 UNIT/ML FLEXPEN: 10.0000 [IU] | PEN_INJECTOR | Freq: Three times a day (TID) | SUBCUTANEOUS | 11 refills | Status: DC

## 2019-04-29 NOTE — Progress Notes (Signed)
04/29/2019                                                        Endocrinology Telehealth Visit Follow up Note -During COVID -19 Pandemic  This visit type was conducted due to national recommendations for restrictions regarding the COVID-19 Pandemic  in an effort to limit this patient's exposure and mitigate transmission of the corona virus.  Due to his co-morbid illnesses, Dylan Lo. is at  moderate to high risk for complications without adequate follow up.  This format is felt to be most appropriate for him at this time.  I connected with this patient on 04/29/2019   by telephone and verified that I am speaking with the correct person using two identifiers. [name], 05/24/1956. he has verbally consented to this visit. All issues noted in this document were discussed and addressed. The format was not optimal for physical exam.     Subjective:    Patient ID: Dylan Brittle., male    DOB: 07-07-1956. Patient is being engaged in telehealth for follow-up   for management of currently uncontrolled, complicated type 2 diabetes requested by  Sharilyn Sites, MD  Past Medical History:  Diagnosis Date  . Chronic back pain   . CML (chronic myelocytic leukemia) ([place])   . Diabetes mellitus without complication ([place])   . History of stress test    myoview study suggested the possibility of basal inferior wall ischemia which was present in all 3 tonographic views.  [name] Hx of echocardiogram 05/2012   EF 55%Mildly hypertrophic left ventricle with normal systolic function and early signs of mild diastolic dysfuntion. Moderately dilated left atrium, mild aortic valve sclerosis with out stenosis.  . Hyperlipemia   . Hypertension   . Leukemia [facility])    Past Surgical History:  Procedure Laterality Date  . BACK SURGERY  12/2006  . CARDIAC CATHETERIZATION  2010   revealed mild nonobstructive CAD  . CARDIAC CATHETERIZATION  07/2012   This showed essentially normal coronary arteries without  obstruction. there was normal LV function.  . COLONOSCOPY WITH PROPOFOL N/A 07/14/2018   Procedure: COLONOSCOPY WITH PROPOFOL;  Surgeon: Daneil Dolin, MD;  Location: [facility];  Service: Endoscopy;  Laterality: N/A;  10:45am  . KNEE SURGERY Left 01/1994  . LEFT HEART CATHETERIZATION WITH CORONARY ANGIOGRAM N/A 07/29/2012   Procedure: LEFT HEART CATHETERIZATION WITH CORONARY ANGIOGRAM;  Surgeon: Troy Sine, MD;  Location: [facility] CATH LAB;  Service: Cardiovascular;  Laterality: N/A;  . POLYPECTOMY  07/14/2018   Procedure: POLYPECTOMY;  Surgeon: Daneil Dolin, MD;  Location: [facility];  Service: Endoscopy;;  colon   Social History   Socioeconomic History  . Marital status: Married    Spouse name: Not on file  . Number of children: Not on file  . Years of education: Not on file  . Highest education level: Not on file  Occupational History  . Not on file  Social Needs  . Financial resource strain: Not on file  . Food insecurity:    Worry: Not on file    Inability: Not on file  . Transportation needs:    Medical: Not on file    Non-medical: Not on file  Tobacco Use  . Smoking status: Former Smoker    Packs/day: 0.25  Years: 15.00    Pack years: 3.75    Types: Cigarettes    Last attempt to quit: 07/08/1987    Years since quitting: 31.8  . Smokeless tobacco: Never Used  . Tobacco comment: "30 years ago"  Substance and Sexual Activity  . Alcohol use: Yes    Comment: "very seldom"  . Drug use: No  . Sexual activity: Yes    Birth control/protection: None  Lifestyle  . Physical activity:    Days per week: Not on file    Minutes per session: Not on file  . Stress: Not on file  Relationships  . Social connections:    Talks on phone: Not on file    Gets together: Not on file    Attends religious service: Not on file    Active member of club or organization: Not on file    Attends meetings of clubs or organizations: Not on file    Relationship status: Not on file   Other Topics Concern  . Not on file  Social History Narrative  . Not on file   Outpatient Encounter Medications as of 04/29/2019  Medication Sig  . ACCU-CHEK AVIVA PLUS test strip USE TO TEST 3 TIMES DAILY.  [name] ACCU-CHEK SOFTCLIX LANCETS lancets USE TO TEST 3 TIMES DAILY.  [name] amLODipine (NORVASC) 5 MG tablet TAKE 1 TABLET BY MOUTH ONCE A DAY FOR BLOOD PRESSURE.  [name] aspirin EC 81 MG tablet Take 81 mg by mouth daily.  [name] atorvastatin (LIPITOR) 20 MG tablet Take 20 mg by mouth at bedtime.   . B-D ULTRAFINE III SHORT PEN 31G X 8 MM MISC USING WITH TOUJEO  . BYSTOLIC 10 MG tablet TAKE ONE TABLET BY MOUTH EVERY DAY.  [name] insulin aspart (NOVOLOG FLEXPEN) 100 UNIT/ML FlexPen Inject 10 Units into the skin 3 (three) times daily with meals.  . Insulin Degludec (TRESIBA FLEXTOUCH) 200 UNIT/ML SOPN Inject 80 Units into the skin at bedtime.  . Multiple Vitamin (MULTIVITAMIN WITH MINERALS) TABS Take 1 tablet by mouth daily.  . Oxycodone HCl 10 MG TABS Take 10 mg by mouth daily as needed (for pain.).   [name] ramipril (ALTACE) 5 MG capsule TAKE (1) CAPSULE BY MOUTH ONCE DAILY.  . [DISCONTINUED] metFORMIN (GLUCOPHAGE) 500 MG tablet Take 1 tablet (500 mg total) by mouth 2 (two) times daily.   No facility-administered encounter medications on file as of 04/29/2019.    ALLERGIES: No Known Allergies VACCINATION STATUS:  There is no immunization history on file for this patient.  Diabetes  He presents for his follow-up diabetic visit. He has type 2 diabetes mellitus. Onset time: He was diagnosed at approximate age of 63 years. His disease course has been worsening. There are no hypoglycemic associated symptoms. Pertinent negatives for hypoglycemia include no confusion, headaches, pallor or seizures. Pertinent negatives for diabetes include no chest pain, no fatigue, no polydipsia, no polyphagia, no polyuria and no weakness. There are no hypoglycemic complications. Symptoms are worsening. Diabetic complications include  nephropathy. Risk factors for coronary artery disease include diabetes mellitus, dyslipidemia, hypertension, male sex, sedentary lifestyle and tobacco exposure. Current diabetic treatment includes oral agent (dual therapy) and insulin injections. His weight is increasing steadily. He is following a generally unhealthy diet. When asked about meal planning, he reported none. He has not had a previous visit with a dietitian. He never participates in exercise. His home blood glucose trend is decreasing steadily. His breakfast blood glucose range is generally 180-200 mg/dl. His bedtime blood glucose range is  generally 180-200 mg/dl. His overall blood glucose range is 180-200 mg/dl. An ACE inhibitor/angiotensin II receptor blocker is being taken. Eye exam is current.  Hyperlipidemia  This is a chronic problem. The current episode started more than 1 year ago. Exacerbating diseases include diabetes and obesity. Pertinent negatives include no chest pain, myalgias or shortness of breath. Current antihyperlipidemic treatment includes statins. Risk factors for coronary artery disease include dyslipidemia, hypertension, male sex, a sedentary lifestyle and obesity.  Hypertension  This is a chronic problem. The current episode started more than 1 year ago. The problem is uncontrolled. Pertinent negatives include no chest pain, headaches, neck pain, palpitations or shortness of breath. Risk factors for coronary artery disease include dyslipidemia, diabetes mellitus, male gender, obesity and sedentary lifestyle. Past treatments include ACE inhibitors. Hypertensive end-organ damage includes kidney disease.     Review of Systems  Constitutional: Negative for chills, fatigue, fever and unexpected weight change.  HENT: Negative for dental problem, mouth sores and trouble swallowing.   Eyes: Negative for visual disturbance.  Respiratory: Negative for cough, choking, chest tightness, shortness of breath and wheezing.    Cardiovascular: Negative for chest pain, palpitations and leg swelling.  Gastrointestinal: Negative for abdominal distention, abdominal pain, constipation, diarrhea, nausea and vomiting.  Endocrine: Negative for polydipsia, polyphagia and polyuria.  Genitourinary: Negative for dysuria, flank pain, hematuria and urgency.  Musculoskeletal: Negative for back pain, gait problem, myalgias and neck pain.  Skin: Negative for pallor, rash and wound.  Neurological: Negative for seizures, syncope, weakness, numbness and headaches.  Psychiatric/Behavioral: Negative.  Negative for confusion and dysphoric mood.    Objective:    There were no vitals taken for this visit.  Wt Readings from Last 3 Encounters:  12/25/18 246 lb (111.6 kg)  08/07/18 239 lb (108.4 kg)  07/08/18 241 lb (109.3 kg)    CMP     Component Value Date/Time   NA 139 04/21/2019 0930   K 4.4 04/21/2019 0930   CL 102 04/21/2019 0930   CO2 22 04/21/2019 0930   GLUCOSE 159 (H) 04/21/2019 0930   GLUCOSE 134 12/17/2018 0952   BUN 15 04/21/2019 0930   CREATININE 1.41 (H) 04/21/2019 0930   CREATININE 1.22 12/17/2018 0952   CALCIUM 10.1 04/21/2019 0930   PROT 6.7 04/21/2019 0930   ALBUMIN 4.1 04/21/2019 0930   AST 14 04/21/2019 0930   ALT 20 04/21/2019 0930   ALKPHOS 92 04/21/2019 0930   BILITOT 0.3 04/21/2019 0930   GFRNONAA 52 (L) 04/21/2019 0930   GFRNONAA 63 12/17/2018 0952   GFRAA 60 04/21/2019 0930   GFRAA 73 12/17/2018 0952     Diabetic Labs (most recent): Lab Results  Component Value Date   HGBA1C 10.2 (H) 04/21/2019   HGBA1C 9.0 (H) 12/17/2018   HGBA1C 9.2 (H) 07/31/2018    Assessment & Plan:   1. Uncontrolled type 2 diabetes mellitus with worsening chronic kidney disease.  - Patient has currently uncontrolled symptomatic type 2 DM since  63 years of age. - He reports significant loss of control of glycemia and higher A1c before this visit.     - Recent labs reviewed.   His diabetes is complicated by  stage 2-3 CK D and patient remains at a high risk for more acute and chronic complications of diabetes which include CAD, CVA, CKD, retinopathy, and neuropathy. These are all discussed in detail with the patient.  - I have counseled the patient on diet management and weight loss, by adopting a carbohydrate restricted/protein rich  diet.  - Patient admits there is a room for improvement in his diet and drink choices. -  Suggestion is made for him to avoid simple carbohydrates  from his diet including Cakes, Sweet Desserts / Pastries, Ice Cream, Soda (diet and regular), Sweet Tea, Candies, Chips, Cookies, Store Bought Juices, Alcohol in Excess of  1-2 drinks a day, Artificial Sweeteners, and "Sugar-free" Products. This will help patient to have stable blood glucose profile and potentially avoid unintended weight gain.  - I encouraged the patient to switch to  unprocessed or minimally processed complex starch and increased protein intake (animal or plant source), fruits, and vegetables.  - Patient is advised to stick to a routine mealtimes to eat 3 meals  a day and avoid unnecessary snacks ( to snack only to correct hypoglycemia).   - I have approached patient with the following individualized plan to manage diabetes and patient agrees:   -He will need intensive treatment with basal/bolus insulin in order for him to achieve control of diabetes to target.   -He is advised to continue Tresiba 80 units nightly, discussed and initiated NovoLog 10 units 3 times daily AC for pre-meal blood glucose readings greater than 90 mg per DL, associated with strict monitoring of blood glucose 4 times a day-before meals and at bedtime.   -Patient is encouraged to call clinic for blood glucose levels less than 70 or above 200 mg /dl.  - His labs show declining renal function, advised to discontinue metformin for now.   - He is advised to avoid any over-the-counter NSAIDs and avoid dehydration.    - Patient specific  target  A1c;  LDL, HDL, Triglycerides, and  Waist Circumference were discussed in detail.  2) BP/HTN: he is advised to home monitor blood pressure and report if > 140/90 on 2 separate readings.  He is advised to continue his current blood pressure medications including amlodipine 10 mg p.o. daily, ramipril 5 mg p.o. daily, Bystolic 5 mg p.o. daily.     3) Lipids/HPL: No recent lipid panel to review.  He is advised to continue his atorvastatin 20 mg p.o. nightly. He will be considered for fasting lipid panel on subsequent visits.   4)  Weight/Diet: CDE Consult has been initiated , exercise, and detailed carbohydrates information provided.  5) Chronic Care/Health Maintenance:  -Patient is on ACEI/ARB and Statin medications and encouraged to continue to follow up with Ophthalmology, Podiatrist at least yearly or according to recommendations, and advised to   stay away from smoking. I have recommended yearly flu vaccine and pneumonia vaccination at least every 5 years; moderate intensity exercise for up to 150 minutes weekly; and  sleep for at least 7 hours a day.  - I advised patient to maintain close follow up with Sharilyn Sites, MD for primary care needs.  - Patient Care Time Today:  25 min, of which >50% was spent in reviewing his  current and  previous labs/studies, previous treatments, and medications doses and developing a plan for long-term care based on the latest recommendations for standards of care.  Dylan Brittle. participated in the discussions, expressed understanding, and voiced agreement with the above plans.  All questions were answered to his satisfaction. he is encouraged to contact clinic should he have any questions or concerns prior to his return visit.   Follow up plan: - Return in about 3 months (around 07/30/2019), or send logs , for Follow up with Pre-visit Labs, Meter, and Logs.  Glade Lloyd, MD  Phone: [phone]  Fax: [phone]  -  This note was partially  dictated with voice recognition software. Similar sounding words can be transcribed inadequately or may not  be corrected upon review.  04/29/2019, 5:30 PM

## 2019-05-14 ENCOUNTER — Other Ambulatory Visit: Payer: Self-pay | Admitting: Radiology

## 2019-05-14 DIAGNOSIS — C9211 Chronic myeloid leukemia, BCR/ABL-positive, in remission: Secondary | ICD-10-CM | POA: Diagnosis not present

## 2019-05-15 DIAGNOSIS — C9211 Chronic myeloid leukemia, BCR/ABL-positive, in remission: Secondary | ICD-10-CM | POA: Diagnosis not present

## 2019-05-18 ENCOUNTER — Encounter (HOSPITAL_COMMUNITY): Payer: Self-pay

## 2019-05-18 ENCOUNTER — Ambulatory Visit (HOSPITAL_COMMUNITY)
Admission: RE | Admit: 2019-05-18 | Discharge: 2019-05-18 | Disposition: A | Discharge status: Home / Self Care | Payer: Medicare Other | Source: Ambulatory Visit | Attending: Nephrology | Admitting: Nephrology

## 2019-05-18 ENCOUNTER — Other Ambulatory Visit: Payer: Self-pay

## 2019-05-18 DIAGNOSIS — I10 Essential (primary) hypertension: Secondary | ICD-10-CM

## 2019-05-18 DIAGNOSIS — N182 Chronic kidney disease, stage 2 (mild): Secondary | ICD-10-CM | POA: Insufficient documentation

## 2019-05-18 DIAGNOSIS — R809 Proteinuria, unspecified: Secondary | ICD-10-CM

## 2019-05-18 DIAGNOSIS — Z800 Family history of malignant neoplasm of digestive organs: Secondary | ICD-10-CM | POA: Diagnosis not present

## 2019-05-18 DIAGNOSIS — Z7982 Long term (current) use of aspirin: Secondary | ICD-10-CM | POA: Diagnosis not present

## 2019-05-18 DIAGNOSIS — E1122 Type 2 diabetes mellitus with diabetic chronic kidney disease: Secondary | ICD-10-CM | POA: Insufficient documentation

## 2019-05-18 DIAGNOSIS — Z79899 Other long term (current) drug therapy: Secondary | ICD-10-CM | POA: Diagnosis not present

## 2019-05-18 DIAGNOSIS — N289 Disorder of kidney and ureter, unspecified: Secondary | ICD-10-CM | POA: Diagnosis not present

## 2019-05-18 DIAGNOSIS — C9210 Chronic myeloid leukemia, BCR/ABL-positive, not having achieved remission: Secondary | ICD-10-CM | POA: Insufficient documentation

## 2019-05-18 DIAGNOSIS — Z87891 Personal history of nicotine dependence: Secondary | ICD-10-CM | POA: Diagnosis not present

## 2019-05-18 DIAGNOSIS — I129 Hypertensive chronic kidney disease with stage 1 through stage 4 chronic kidney disease, or unspecified chronic kidney disease: Secondary | ICD-10-CM | POA: Diagnosis not present

## 2019-05-18 DIAGNOSIS — E785 Hyperlipidemia, unspecified: Secondary | ICD-10-CM | POA: Insufficient documentation

## 2019-05-18 DIAGNOSIS — Z794 Long term (current) use of insulin: Secondary | ICD-10-CM | POA: Insufficient documentation

## 2019-05-18 LAB — CBC
HCT: 44.8 % (ref 39.0–52.0)
Hemoglobin: 14.9 g/dL (ref 13.0–17.0)
MCH: 28.4 pg (ref 26.0–34.0)
MCHC: 33.3 g/dL (ref 30.0–36.0)
MCV: 85.5 fL (ref 80.0–100.0)
Platelets: 296 10*3/uL (ref 150–400)
RBC: 5.24 MIL/uL (ref 4.22–5.81)
RDW: 14.9 % (ref 11.5–15.5)
WBC: 11.1 10*3/uL — ABNORMAL HIGH (ref 4.0–10.5)
nRBC: 0 % (ref 0.0–0.2)

## 2019-05-18 LAB — PROTIME-INR
INR: 0.9 (ref 0.8–1.2)
Prothrombin Time: 12.1 seconds (ref 11.4–15.2)

## 2019-05-18 LAB — GLUCOSE, CAPILLARY
Glucose-Capillary: 183 mg/dL — ABNORMAL HIGH (ref 70–99)
Glucose-Capillary: 149 mg/dL — ABNORMAL HIGH (ref 70–99)

## 2019-05-18 MED ORDER — LABETALOL HCL 5 MG/ML IV SOLN
INTRAVENOUS | Status: AC
  Filled 2019-05-18: qty 4

## 2019-05-18 MED ORDER — FENTANYL CITRATE (PF) 100 MCG/2ML IJ SOLN
INTRAMUSCULAR | Status: AC | PRN
  Administered 2019-05-18: 50 ug via INTRAVENOUS

## 2019-05-18 MED ORDER — LIDOCAINE HCL (PF) 1 % IJ SOLN
INTRAMUSCULAR | Status: AC
  Filled 2019-05-18: qty 30

## 2019-05-18 MED ORDER — FENTANYL CITRATE (PF) 100 MCG/2ML IJ SOLN
INTRAMUSCULAR | Status: AC
  Filled 2019-05-18: qty 2

## 2019-05-18 MED ORDER — SODIUM CHLORIDE 0.9 % IV SOLN: INTRAVENOUS | Status: DC

## 2019-05-18 MED ORDER — GELATIN ABSORBABLE 12-7 MM EX MISC
CUTANEOUS | Status: AC
  Filled 2019-05-18: qty 1

## 2019-05-18 MED ORDER — MIDAZOLAM HCL 2 MG/2ML IJ SOLN
INTRAMUSCULAR | Status: AC | PRN
  Administered 2019-05-18: 0.5 mg via INTRAVENOUS
  Administered 2019-05-18: 1 mg via INTRAVENOUS

## 2019-05-18 MED ORDER — MIDAZOLAM HCL 2 MG/2ML IJ SOLN
INTRAMUSCULAR | Status: AC
  Filled 2019-05-18: qty 2

## 2019-05-18 MED ORDER — HYDROCODONE-ACETAMINOPHEN 5-325 MG PO TABS: 1.0000 | ORAL_TABLET | ORAL | Status: DC | PRN

## 2019-05-18 MED ORDER — LABETALOL HCL 5 MG/ML IV SOLN
5.0000 mg | Freq: Once | INTRAVENOUS | Status: DC
  Filled 2019-05-18: qty 4

## 2019-05-18 NOTE — Discharge Instructions (Signed)
Percutaneous Kidney Biopsy, Care After °This sheet gives you information about how to care for yourself after your procedure. Your health care provider may also give you more specific instructions. If you have problems or questions, contact your health care provider. °What can I expect after the procedure? °After the procedure, it is common to have: °· Pain or soreness near the area where the needle went through your skin (biopsy site). °· Bright pink or cloudy urine for 24 hours after the procedure. °Follow these instructions at home: °Activity °· Return to your normal activities as told by your health care provider. Ask your health care provider what activities are safe for you. °· Do not drive for 24 hours if you were given a medicine to help you relax (sedative). °· Do not lift anything that is heavier than 10 lb (4.5 kg) until your health care provider tells you that it is safe. °· Avoid activities that take a lot of effort (are strenuous) until your health care provider approves. Most people will have to wait 2 weeks before returning to activities such as exercise or sexual intercourse. °General instructions ° °· Take over-the-counter and prescription medicines only as told by your health care provider. °· You may eat and drink after your procedure. Follow instructions from your health care provider about eating or drinking restrictions. °· Check your biopsy site every day for signs of infection. Check for: °? More redness, swelling, or pain. °? More fluid or blood. °? Warmth. °? Pus or a bad smell. °· Keep all follow-up visits as told by your health care provider. This is important. °Contact a health care provider if: °· You have more redness, swelling, or pain around your biopsy site. °· You have more fluid or blood coming from your biopsy site. °· Your biopsy site feels warm to the touch. °· You have pus or a bad smell coming from your biopsy site. °· You have blood in your urine more than 24 hours after  your procedure. °Get help right away if: °· You have dark red or brown urine. °· You have a fever. °· You are unable to urinate. °· You feel burning when you urinate. °· You feel faint. °· You have severe pain in your abdomen or side. °This information is not intended to replace advice given to you by your health care provider. Make sure you discuss any questions you have with your health care provider. °Document Released: 07/15/2013 Document Revised: 08/24/2016 Document Reviewed: 08/24/2016 °Elsevier Interactive Patient Education © 2019 [employer] °Moderate Conscious Sedation, Adult, Care After °These instructions provide you with information about caring for yourself after your procedure. Your health care provider may also give you more specific instructions. Your treatment has been planned according to current medical practices, but problems sometimes occur. Call your health care provider if you have any problems or questions after your procedure. °What can I expect after the procedure? °After your procedure, it is common: °· To feel sleepy for several hours. °· To feel clumsy and have poor balance for several hours. °· To have poor judgment for several hours. °· To vomit if you eat too soon. °Follow these instructions at home: °For at least 24 hours after the procedure: ° °· Do not: °? Participate in activities where you could fall or become injured. °? Drive. °? Use heavy machinery. °? Drink alcohol. °? Take sleeping pills or medicines that cause drowsiness. °? Make important decisions or sign legal documents. °? Take care of children on   your own. °· Rest. °Eating and drinking °· Follow the diet recommended by your health care provider. °· If you vomit: °? Drink water, juice, or soup when you can drink without vomiting. °? Make sure you have little or no nausea before eating solid foods. °General instructions °· Have a responsible adult stay with you until you are awake and alert. °· Take over-the-counter  and prescription medicines only as told by your health care provider. °· If you smoke, do not smoke without supervision. °· Keep all follow-up visits as told by your health care provider. This is important. °Contact a health care provider if: °· You keep feeling nauseous or you keep vomiting. °· You feel light-headed. °· You develop a rash. °· You have a fever. °Get help right away if: °· You have trouble breathing. °This information is not intended to replace advice given to you by your health care provider. Make sure you discuss any questions you have with your health care provider. °Document Released: 09/02/2013 Document Revised: 04/16/2016 Document Reviewed: 03/03/2016 °Elsevier Interactive Patient Education © 2019 [employer] ° °

## 2019-05-18 NOTE — Progress Notes (Signed)
Pt ambulated to BR without diff.  Voided large amt of clear urine.

## 2019-05-18 NOTE — H&P (Signed)
Chief Complaint: Patient was seen in consultation today for random renal biopsy at the request of [place]  Referring Physician(s): Fran Lowes  Supervising Physician: Arne Cleveland  Patient Status: [name] - Out-pt  History of Present Illness: Dylan Spears. is a 63 y.o. male   CKD 2 HTN Follows with Dr Lowanda Foster +proteinuria- worsening: nephrotic range Now scheduled for random renal biopsy   Past Medical History:  Diagnosis Date  . Chronic back pain   . CML (chronic myelocytic leukemia) ([place])   . Diabetes mellitus without complication ([place])   . History of stress test    myoview study suggested the possibility of basal inferior wall ischemia which was present in all 3 tonographic views.  [name] Hx of echocardiogram 05/2012   EF 55%Mildly hypertrophic left ventricle with normal systolic function and early signs of mild diastolic dysfuntion. Moderately dilated left atrium, mild aortic valve sclerosis with out stenosis.  . Hyperlipemia   . Hypertension   . Leukemia [facility])     Past Surgical History:  Procedure Laterality Date  . BACK SURGERY  12/2006  . CARDIAC CATHETERIZATION  2010   revealed mild nonobstructive CAD  . CARDIAC CATHETERIZATION  07/2012   This showed essentially normal coronary arteries without obstruction. there was normal LV function.  . COLONOSCOPY WITH PROPOFOL N/A 07/14/2018   Procedure: COLONOSCOPY WITH PROPOFOL;  Surgeon: Daneil Dolin, MD;  [facility];  Service: Endoscopy;  Laterality: N/A;  10:45am  . KNEE SURGERY Left 01/1994  . LEFT HEART CATHETERIZATION WITH CORONARY ANGIOGRAM N/A 07/29/2012   Procedure: LEFT HEART CATHETERIZATION WITH CORONARY ANGIOGRAM;  Surgeon: Troy Sine, MD;  Location: [facility] CATH LAB;  Service: Cardiovascular;  Laterality: N/A;  . POLYPECTOMY  07/14/2018   Procedure: POLYPECTOMY;  Surgeon: Daneil Dolin, MD;  Location: [facility];  Service: Endoscopy;;  colon    Allergies: Patient has  no known allergies.  Medications: Prior to Admission medications   Medication Sig Start Date End Date Taking? Authorizing Provider  amLODipine (NORVASC) 5 MG tablet TAKE 1 TABLET BY MOUTH ONCE A DAY FOR BLOOD PRESSURE. Patient taking differently: Take 5 mg by mouth daily.  04/23/19  Yes Cassandria Anger, MD  aspirin EC 81 MG tablet Take 81 mg by mouth daily.   Yes [provider]  atorvastatin (LIPITOR) 20 MG tablet Take 20 mg by mouth at bedtime.    Yes [provider]  BYSTOLIC 10 MG tablet TAKE ONE TABLET BY MOUTH EVERY DAY. Patient taking differently: Take 10 mg by mouth daily.  03/17/19  Yes Troy Sine, MD  Insulin Degludec (TRESIBA FLEXTOUCH) 200 UNIT/ML SOPN Inject 80 Units into the skin at bedtime. 12/25/18  Yes Nida, Marella Chimes, MD  insulin lispro (HUMALOG) 100 UNIT/ML injection Inject 10 Units into the skin 3 (three) times daily before meals.   Yes [provider]  Multiple Vitamin (MULTIVITAMIN WITH MINERALS) TABS Take 1 tablet by mouth daily.   Yes [provider]  Oxycodone HCl 10 MG TABS Take 10 mg by mouth daily as needed (for pain.).  10/04/16  Yes [provider]  ramipril (ALTACE) 10 MG capsule Take 10 mg by mouth daily.   Yes [provider]  ACCU-CHEK AVIVA PLUS test strip USE TO TEST 3 TIMES DAILY. 04/23/19   Cassandria Anger, MD  ACCU-CHEK SOFTCLIX LANCETS lancets USE TO TEST 3 TIMES DAILY. 12/04/18   Nida, Marella Chimes, MD  B-D ULTRAFINE III SHORT PEN 31G X 8  MM MISC USING WITH TOUJEO 03/17/19   Cassandria Anger, MD     Family History  Problem Relation Age of Onset  . Colon cancer Mother 73  . Cancer Father        Unsure of what type; not colon CA    Social History   Socioeconomic History  . Marital status: Married    Spouse name: Not on file  . Number of children: Not on file  . Years of education: Not on file  . Highest education level: Not on file  Occupational History  . Not on  file  Social Needs  . Financial resource strain: Not on file  . Food insecurity    Worry: Not on file    Inability: Not on file  . Transportation needs    Medical: Not on file    Non-medical: Not on file  Tobacco Use  . Smoking status: Former Smoker    Packs/day: 0.25    Years: 15.00    Pack years: 3.75    Types: Cigarettes    Quit date: 07/08/1987    Years since quitting: 31.8  . Smokeless tobacco: Never Used  . Tobacco comment: "30 years ago"  Substance and Sexual Activity  . Alcohol use: Yes    Comment: "very seldom"  . Drug use: No  . Sexual activity: Yes    Birth control/protection: None  Lifestyle  . Physical activity    Days per week: Not on file    Minutes per session: Not on file  . Stress: Not on file  Relationships  . Social Herbalist on phone: Not on file    Gets together: Not on file    Attends religious service: Not on file    Active member of club or organization: Not on file    Attends meetings of clubs or organizations: Not on file    Relationship status: Not on file  Other Topics Concern  . Not on file  Social History Narrative  . Not on file    Review of Systems: A 12 point ROS discussed and pertinent positives are indicated in the HPI above.  All other systems are negative.  Review of Systems  Constitutional: Negative for activity change, fatigue and fever.  Respiratory: Negative for cough and shortness of breath.   Cardiovascular: Negative for chest pain.  Gastrointestinal: Negative for abdominal pain.  Musculoskeletal: Negative for back pain.  Neurological: Negative for weakness.  Psychiatric/Behavioral: Negative for behavioral problems and confusion.    Vital Signs: BP (!) 177/91   Pulse 61   Temp 98 F (36.7 C) (Oral)   Resp 16   Ht 6\' 1"  (1.854 m)   Wt 245 lb (111.1 kg)   SpO2 100%   BMI 32.32 kg/m   Physical Exam Vitals signs reviewed.  Cardiovascular:     Rate and Rhythm: Normal rate and regular rhythm.      Heart sounds: Normal heart sounds.  Pulmonary:     Effort: Pulmonary effort is normal.     Breath sounds: Normal breath sounds.  Abdominal:     Palpations: Abdomen is soft.     Tenderness: There is no abdominal tenderness.  Musculoskeletal: Normal range of motion.  Skin:    General: Skin is warm and dry.  Neurological:     Mental Status: He is alert and oriented to person, place, and time.  Psychiatric:        Mood and Affect: Mood normal.  Behavior: Behavior normal.        Thought Content: Thought content normal.        Judgment: Judgment normal.     Imaging: No results found.  Labs:  CBC: Recent Labs    07/07/18 1113 05/18/19 0606  WBC 8.4 11.1*  HGB 12.9* 14.9  HCT 36.9* 44.8  PLT 319 296    COAGS: Recent Labs    05/18/19 0606  INR 0.9    BMP: Recent Labs    07/07/18 1113 07/31/18 0936 12/17/18 0952 04/21/19 0930  NA 136 139 139 139  K 4.2 4.0 4.3 4.4  CL 104 104 105 102  CO2 25 27 28 22   GLUCOSE 316* 83 134 159*  BUN 20 19 20 15   CALCIUM 9.6 9.7 9.6 10.1  CREATININE 1.28* 1.45* 1.22 1.41*  GFRNONAA 58* 51* 63 52*  GFRAA >60 59* 73 60    LIVER FUNCTION TESTS: Recent Labs    07/31/18 0936 12/17/18 0952 04/21/19 0930  BILITOT 0.5 0.4 0.3  AST 13 22 14   ALT 13 29 20   ALKPHOS  --   --  92  PROT 6.5 6.4 6.7  ALBUMIN  --   --  4.1    TUMOR MARKERS: No results for input(s): AFPTM, CEA, CA199, CHROMGRNA in the last 8760 hours.  Assessment and Plan:  Worsening proteinuria Nephrotic range Known HTN Scheduled for random renal biopsy Risks and benefits of random renal bx was discussed with the patient and/or patient's family including, but not limited to bleeding, infection, damage to adjacent structures or low yield requiring additional tests.  All of the questions were answered and there is agreement to proceed. Consent signed and in chart.   Thank you for this interesting consult.  I greatly enjoyed meeting Emelio Schneller.  and look forward to participating in their care.  A copy of this report was sent to the requesting provider on this date.  Electronically Signed: Lavonia Drafts, PA-C 05/18/2019, 7:29 AM   I spent a total of  30 Minutes   in face to face in clinical consultation, greater than 50% of which was counseling/coordinating care for random renal bx

## 2019-05-18 NOTE — Sedation Documentation (Signed)
Bedrest starts at 0830.

## 2019-05-18 NOTE — Procedures (Signed)
  Procedure: [place] core LLP kidney 16g x2 EBL:   minimal Complications:  none immediate  See full dictation in [facility].  Dillard Cannon MD Main # [phone] Pager  [phone]

## 2019-05-19 ENCOUNTER — Other Ambulatory Visit: Payer: Self-pay | Admitting: "Endocrinology

## 2019-05-25 ENCOUNTER — Other Ambulatory Visit: Payer: Self-pay | Admitting: "Endocrinology

## 2019-05-27 ENCOUNTER — Encounter (HOSPITAL_COMMUNITY): Payer: Self-pay | Admitting: Family Medicine

## 2019-06-10 ENCOUNTER — Other Ambulatory Visit: Payer: Self-pay | Admitting: "Endocrinology

## 2019-06-11 DIAGNOSIS — E119 Type 2 diabetes mellitus without complications: Secondary | ICD-10-CM | POA: Diagnosis not present

## 2019-06-11 DIAGNOSIS — H2513 Age-related nuclear cataract, bilateral: Secondary | ICD-10-CM | POA: Diagnosis not present

## 2019-06-11 DIAGNOSIS — Z794 Long term (current) use of insulin: Secondary | ICD-10-CM | POA: Diagnosis not present

## 2019-06-11 LAB — HM DIABETES EYE EXAM

## 2019-06-17 ENCOUNTER — Ambulatory Visit (INDEPENDENT_AMBULATORY_CARE_PROVIDER_SITE_OTHER): Payer: Medicare Other | Admitting: Cardiovascular Disease

## 2019-06-17 ENCOUNTER — Telehealth: Payer: Self-pay | Admitting: Cardiovascular Disease

## 2019-06-17 ENCOUNTER — Other Ambulatory Visit: Payer: Self-pay

## 2019-06-17 ENCOUNTER — Encounter: Payer: Self-pay | Admitting: Cardiovascular Disease

## 2019-06-17 ENCOUNTER — Encounter (INDEPENDENT_AMBULATORY_CARE_PROVIDER_SITE_OTHER): Payer: Self-pay

## 2019-06-17 VITALS — BP 157/86 | HR 62 | Ht 73.0 in | Wt 252.0 lb

## 2019-06-17 DIAGNOSIS — E669 Obesity, unspecified: Secondary | ICD-10-CM

## 2019-06-17 DIAGNOSIS — N289 Disorder of kidney and ureter, unspecified: Secondary | ICD-10-CM

## 2019-06-17 DIAGNOSIS — C9210 Chronic myeloid leukemia, BCR/ABL-positive, not having achieved remission: Secondary | ICD-10-CM | POA: Diagnosis not present

## 2019-06-17 DIAGNOSIS — I10 Essential (primary) hypertension: Secondary | ICD-10-CM | POA: Diagnosis not present

## 2019-06-17 DIAGNOSIS — G8929 Other chronic pain: Secondary | ICD-10-CM

## 2019-06-17 DIAGNOSIS — M545 Low back pain, unspecified: Secondary | ICD-10-CM

## 2019-06-17 DIAGNOSIS — E785 Hyperlipidemia, unspecified: Secondary | ICD-10-CM

## 2019-06-17 NOTE — Patient Instructions (Signed)

## 2019-06-17 NOTE — Progress Notes (Signed)
Patient ID: Dylan Mcclimans., male   DOB: 12/23/1955, 63 y.o.   MRN: [id number]     PCP:  Dr. Hilma Favors   HPI: Dylan Nay., is a 63 y.o. male who presents to the office today for a 15 month cardiology evaluation.   Dylan Spears has a history of CML which was originally diagnosed in 2004, as well as a history of hypertension, hyperlipidemia, and type 2 diabetes mellitus. In 2010 he developed nitrate responsive chest pain which led to cardiac catheterization.  Catheterization revealed normal coronary arteries and normal LV function. In 2013 because of recurrent chest pain symptoms and echo Doppler study as well as Myoview scan were done. With exercise Myoview he exercised to a 9 minute workload. There was a question of borderline mild ischemia in the basal inferior wall. Ejection fraction is 54%. An echo Doppler study showed normal systolic and diastolic function. There was a question of pseudo-dyskinesis of the inferior wall due to increased infradiaphragmatic pressure.   He had mild aortic sclerosis.  When saw him in November 2017  he had noted some mild shortness of breath with significant activity.   Dr. Hilma Favors reduce his amlodipine dose and he now is on amlodipine at 5 mg, Bystolic 5 mg and ramipril 5 mg daily for hypertension, and atorvastatin 20 mg for hyperlipidemia.  He is diabetic on metformin and chest CT but flex, touch insulin.  His CML is in remission and he continues to be on Gleevec.    I last saw him in April 2019.  He admitted to mild weight gain and had not been exercising regularly.   He had laboratory in October 2018 by Dr. Hilma Favors which showed a cholesterol 123, HDL 43, LDL 64, triglycerides 79.  Recent hemoglobin A1c in March 2019 was 7.1. Renal function was mildly impaired at 1.49, but this was stable.  With his 15 pound weight gain, I discussed the American Heart Association recommendations of exercising at least 5 days/week for minimum of 30 minutes of moderate intensity.  Since I last saw him, he has remained stable from a cardiovascular perspective.  He specifically denies chest pain or shortness of breath.  His exercise has been limited due to bilateral knee discomfort and a bad back.  He has chronic low back pain, ruptured disc, and spinal stenosis.  He sees Dr. Lowanda Foster for his kidney disease and apparently underwent an ultrasound-guided renal core biopsy on May 18, 2019.  He has not yet followed up with Dr. Lowanda Foster concerning the results.  He presents for follow-up cardiology evaluation.  Past Medical History:  Diagnosis Date  . Chronic back pain   . CML (chronic myelocytic leukemia) ([place])   . Diabetes mellitus without complication ([place])   . History of stress test    myoview study suggested the possibility of basal inferior wall ischemia which was present in all 3 tonographic views.  [name] Hx of echocardiogram 05/2012   EF 55%Mildly hypertrophic left ventricle with normal systolic function and early signs of mild diastolic dysfuntion. Moderately dilated left atrium, mild aortic valve sclerosis with out stenosis.  . Hyperlipemia   . Hypertension   . Leukemia [facility])     Past Surgical History:  Procedure Laterality Date  . BACK SURGERY  12/2006  . CARDIAC CATHETERIZATION  2010   revealed mild nonobstructive CAD  . CARDIAC CATHETERIZATION  07/2012   This showed essentially normal coronary arteries without obstruction. there was normal LV function.  . COLONOSCOPY WITH PROPOFOL N/A 07/14/2018  Procedure: COLONOSCOPY WITH PROPOFOL;  Surgeon: Daneil Dolin, MD;  [facility];  Service: Endoscopy;  Laterality: N/A;  10:45am  . KNEE SURGERY Left 01/1994  . LEFT HEART CATHETERIZATION WITH CORONARY ANGIOGRAM N/A 07/29/2012   Procedure: LEFT HEART CATHETERIZATION WITH CORONARY ANGIOGRAM;  Surgeon: Troy Sine, MD;  Location: [facility] CATH LAB;  Service: Cardiovascular;  Laterality: N/A;  . POLYPECTOMY  07/14/2018   Procedure: POLYPECTOMY;  Surgeon: Daneil Dolin, MD;  [facility];  Service: Endoscopy;;  colon    No Known Allergies  Current Outpatient Medications  Medication Sig Dispense Refill  . ACCU-CHEK AVIVA PLUS test strip USE TO TEST 3 TIMES DAILY. 100 strip 2  . ACCU-CHEK SOFTCLIX LANCETS lancets USE TO TEST 3 TIMES DAILY. 100 each 5  . amLODipine (NORVASC) 5 MG tablet TAKE 1 TABLET BY MOUTH ONCE A DAY FOR BLOOD PRESSURE. 30 tablet 2  . aspirin EC 81 MG tablet Take 81 mg by mouth daily.    [name] atorvastatin (LIPITOR) 20 MG tablet Take 20 mg by mouth at bedtime.     . B-D ULTRAFINE III SHORT PEN 31G X 8 MM MISC USING WITH TOUJEO 100 each 0  . BYSTOLIC 10 MG tablet TAKE ONE TABLET BY MOUTH EVERY DAY. (Patient taking differently: Take 10 mg by mouth daily. ) 30 tablet 4  . insulin lispro (HUMALOG) 100 UNIT/ML injection Inject 10 Units into the skin 3 (three) times daily before meals.    . Multiple Vitamin (MULTIVITAMIN WITH MINERALS) TABS Take 1 tablet by mouth daily.    . Oxycodone HCl 10 MG TABS Take 10 mg by mouth daily as needed (for pain.).     [name] ramipril (ALTACE) 10 MG capsule Take 10 mg by mouth daily.    . TRESIBA FLEXTOUCH 200 UNIT/ML SOPN INJECT 80 UNITS INTO THE SKIN AT BEDTIME. 9 mL 0   No current facility-administered medications for this visit.     Social History   Socioeconomic History  . Marital status: Married    Spouse name: Not on file  . Number of children: Not on file  . Years of education: Not on file  . Highest education level: Not on file  Occupational History  . Not on file  Social Needs  . Financial resource strain: Not on file  . Food insecurity    Worry: Not on file    Inability: Not on file  . Transportation needs    Medical: Not on file    Non-medical: Not on file  Tobacco Use  . Smoking status: Former Smoker    Packs/day: 0.25    Years: 15.00    Pack years: 3.75    Types: Cigarettes    Quit date: 07/08/1987    Years since quitting: 31.9  . Smokeless tobacco: Never Used  .  Tobacco comment: "30 years ago"  Substance and Sexual Activity  . Alcohol use: Yes    Comment: "very seldom"  . Drug use: No  . Sexual activity: Yes    Birth control/protection: None  Lifestyle  . Physical activity    Days per week: Not on file    Minutes per session: Not on file  . Stress: Not on file  Relationships  . Social Herbalist on phone: Not on file    Gets together: Not on file    Attends religious service: Not on file    Active member of club or organization: Not on file  Attends meetings of clubs or organizations: Not on file    Relationship status: Not on file  . Intimate partner violence    Fear of current or ex partner: Not on file    Emotionally abused: Not on file    Physically abused: Not on file    Forced sexual activity: Not on file  Other Topics Concern  . Not on file  Social History Narrative  . Not on file    Family History  Problem Relation Age of Onset  . Colon cancer Mother 65  . Cancer Father        Marena Chancy of what type; not colon CA   Social history is notable in that he is married. He has 2 children. There is no tobacco use. He quit at age 18. He does not routinely exercise. He does note some fatigue. He rarely drinks alcohol.  ROS General: Negative; No fevers, chills, or night sweats;  HEENT: Negative; No changes in vision or hearing, sinus congestion, difficulty swallowing Pulmonary: Negative; No cough, wheezing, shortness of breath, hemoptysis Cardiovascular: Negative; No chest pain, presyncope, syncope, palpitations GI: Negative; No nausea, vomiting, diarrhea, or abdominal pain GU: Mild erectile dysfunction Musculoskeletal: Bilateral knee discomfort and low back pain Hematologic/Oncology: Positive for CML Endocrine: Positive for type 2 diabetes mellitus Neuro: Negative; no changes in balance, headaches Skin: Negative; No rashes or skin lesions Psychiatric: Negative; No behavioral problems, depression Sleep: Negative; No  snoring, daytime sleepiness, hypersomnolence, bruxism, restless legs, hypnogognic hallucinations, no cataplexy Other comprehensive 14 point system review is negative.  PE BP (!) 157/86   Pulse 62   Ht '6\' 1"'  (1.854 m)   Wt 252 lb (114.3 kg)   SpO2 99%   BMI 33.25 kg/m    Repeat blood pressure by me was improved but still mildly elevated at 138/78.  Wt Readings from Last 3 Encounters:  06/17/19 252 lb (114.3 kg)  05/18/19 245 lb (111.1 kg)  12/25/18 246 lb (111.6 kg)   General: Alert, oriented, no distress.  Skin: normal turgor, no rashes, warm and dry HEENT: Normocephalic, atraumatic. Pupils equal round and reactive to light; sclera anicteric; extraocular muscles intact;  Nose without nasal septal hypertrophy Mouth/Parynx benign; Mallinpatti scale previously noted to be 2, now wearing a mask  Neck: No JVD, no carotid bruits; normal carotid upstroke Lungs: clear to ausculatation and percussion; no wheezing or rales Chest wall: without tenderness to palpitation Heart: PMI not displaced, RRR, s1 s2 normal, 1/6 systolic murmur, no diastolic murmur, no rubs, gallops, thrills, or heaves Abdomen: soft, nontender; no hepatosplenomehaly, BS+; abdominal aorta nontender and not dilated by palpation. Back: no CVA tenderness Pulses 2+ Musculoskeletal: full range of motion, normal strength, no joint deformities Extremities: no clubbing cyanosis or edema, Homan's sign negative  Neurologic: grossly nonfocal; Cranial nerves grossly wnl Psychologic: Normal mood and affect   ECG (independently read by me): NSR at 62; LVH; T wave abnormality in III  April 2019 ECG (independently read by me): normal sinus rhythm at 68 bpm.  LVH by voltage.  November 2017 ECG (independently read by me): Normal sinus rhythm at 65 bpm.  LVH by voltage.  Normal intervals.  T-wave abnormality in lead 3.  November 2016 ECG (independently read by me): Normal sinus rhythm at 66.  LVH by voltage criteria.  October 2015  ECG (independently read by me): Sinus rhythm at 76 beats per minute.  LVH by voltage criteria in lead aVL  08/2013 ECG: Sinus rhythm. Moderate LVH by voltage criteria.  Early repolarization in V2  LABS:  BMP Latest Ref Rng & Units 04/21/2019 12/17/2018 07/31/2018  Glucose 65 - 99 mg/dL 159(H) 134 83  BUN 8 - 27 mg/dL '15 20 19  ' Creatinine 0.76 - 1.27 mg/dL 1.41(H) 1.22 1.45(H)  BUN/Creat Ratio 10 - 24 11 NOT APPLICABLE 13  Sodium 469 - 144 mmol/L 139 139 139  Potassium 3.5 - 5.2 mmol/L 4.4 4.3 4.0  Chloride 96 - 106 mmol/L 102 105 104  CO2 20 - 29 mmol/L '22 28 27  ' Calcium 8.6 - 10.2 mg/dL 10.1 9.6 9.7   Hepatic Function Latest Ref Rng & Units 04/21/2019 12/17/2018 07/31/2018  Total Protein 6.0 - 8.5 g/dL 6.7 6.4 6.5  Albumin 3.8 - 4.8 g/dL 4.1 - -  AST 0 - 40 IU/L '14 22 13  ' ALT 0 - 44 IU/L '20 29 13  ' Alk Phosphatase 39 - 117 IU/L 92 - -  Total Bilirubin 0.0 - 1.2 mg/dL 0.3 0.4 0.5   CBC Latest Ref Rng & Units 05/18/2019 07/07/2018 05/02/2009  WBC 4.0 - 10.5 K/uL 11.1(H) 8.4 7.3  Hemoglobin 13.0 - 17.0 g/dL 14.9 12.9(L) 13.3  Hematocrit 39.0 - 52.0 % 44.8 36.9(L) 39.1  Platelets 150 - 400 K/uL 296 319 236   Lab Results  Component Value Date   MCV 85.5 05/18/2019   MCV 88.3 07/07/2018   MCV 92.1 05/02/2009            05/01/2009   Lab Results  Component Value Date   HGBA1C 10.2 (H) 04/21/2019  Lipid Panel  No results found for: CHOL, TRIG, HDL, CHOLHDL, VLDL, LDLCALC, LDLDIRECT   RADIOLOGY: No results found.  IMPRESSION: 1. Essential hypertension   2. Hyperlipidemia with target LDL less than 70   3. Renal insufficiency   4. CML (chronic myelocytic leukemia) ([place])   5. Mild obesity   6. Chronic bilateral low back pain, unspecified whether sciatica present    ASSESSMENT AND PLAN: Mr. Dylan Spears is a 63 year old African-American gentleman gentleman who has a history of hypertension with documented mild left ventricular hypertrophy on echocardiography and also by ECG  criteria.  .  He has a history of renal insufficiency which has been stable as well as CML, which has been in remission.  He recently underwent an ultrasound guided renal core biopsy to further evaluate his renal insufficiency and proteinuria ordered by by Dr. Lowanda Foster.  He will be seeing Dr. Lowanda Foster in follow-up to go over the results.  His blood pressure today is mildly elevated and he is on amlodipine at 5 mg (remotely he had been on 10 mg) and he continues to take ramipril 10 mg daily.  I discussed with him the new hypertensive guidelines with stage I hypertension beginning at 130/80.  He will be following up with Dr. Lowanda Foster as well as Dr. Hilma Favors and if his blood pressure remains elevated further medication adjustment will be necessary.  He continues to be on atorvastatin 20 mg daily for hyperlipidemia.  Target LDL is less than 70 in this diabetic male.  I will try to obtain results of recent laboratory since I do not have most recent lipid studies available.  If he is not at goal further medication adjustment will be necessary. He is diabetic on insulin in addition to tresiba.  He has musculoskeletal issues which he describes as to bad knees and chronic low back discomfort which has reduced his ability to exercise.  We discussed consideration of possible stationary bike if walking is difficult.  BMI is 33.25 and is consistent with mild obesity.  Weight loss and improve diet was recommended.  I will see him in 6 months for reevaluation or sooner if problems arise.   Time spent: 25 minutes Troy Sine, MD, [facility]  06/19/2019 11:26 AM

## 2019-06-17 NOTE — Telephone Encounter (Signed)

## 2019-06-18 DIAGNOSIS — E1121 Type 2 diabetes mellitus with diabetic nephropathy: Secondary | ICD-10-CM | POA: Diagnosis not present

## 2019-06-18 DIAGNOSIS — I10 Essential (primary) hypertension: Secondary | ICD-10-CM | POA: Diagnosis not present

## 2019-06-18 DIAGNOSIS — R801 Persistent proteinuria, unspecified: Secondary | ICD-10-CM | POA: Diagnosis not present

## 2019-06-18 DIAGNOSIS — C9211 Chronic myeloid leukemia, BCR/ABL-positive, in remission: Secondary | ICD-10-CM | POA: Diagnosis not present

## 2019-06-18 DIAGNOSIS — E1122 Type 2 diabetes mellitus with diabetic chronic kidney disease: Secondary | ICD-10-CM | POA: Diagnosis not present

## 2019-06-18 DIAGNOSIS — G8929 Other chronic pain: Secondary | ICD-10-CM | POA: Diagnosis not present

## 2019-06-18 DIAGNOSIS — N182 Chronic kidney disease, stage 2 (mild): Secondary | ICD-10-CM | POA: Diagnosis not present

## 2019-06-19 ENCOUNTER — Encounter: Payer: Self-pay | Admitting: Cardiovascular Disease

## 2019-06-23 DIAGNOSIS — C9211 Chronic myeloid leukemia, BCR/ABL-positive, in remission: Secondary | ICD-10-CM | POA: Diagnosis not present

## 2019-06-24 ENCOUNTER — Other Ambulatory Visit: Payer: Self-pay | Admitting: "Endocrinology

## 2019-07-03 ENCOUNTER — Encounter (HOSPITAL_COMMUNITY): Payer: Self-pay | Admitting: Nephrology

## 2019-07-16 ENCOUNTER — Other Ambulatory Visit: Payer: Self-pay | Admitting: "Endocrinology

## 2019-07-22 ENCOUNTER — Other Ambulatory Visit: Payer: Self-pay | Admitting: "Endocrinology

## 2019-07-29 ENCOUNTER — Other Ambulatory Visit: Payer: Self-pay | Admitting: "Endocrinology

## 2019-07-29 DIAGNOSIS — N183 Chronic kidney disease, stage 3 (moderate): Secondary | ICD-10-CM | POA: Diagnosis not present

## 2019-07-29 DIAGNOSIS — E1122 Type 2 diabetes mellitus with diabetic chronic kidney disease: Secondary | ICD-10-CM

## 2019-07-29 DIAGNOSIS — E1165 Type 2 diabetes mellitus with hyperglycemia: Secondary | ICD-10-CM | POA: Diagnosis not present

## 2019-07-29 DIAGNOSIS — Z794 Long term (current) use of insulin: Secondary | ICD-10-CM | POA: Diagnosis not present

## 2019-07-29 DIAGNOSIS — IMO0002 Reserved for concepts with insufficient information to code with codable children: Secondary | ICD-10-CM

## 2019-07-30 ENCOUNTER — Ambulatory Visit: Payer: Medicare Other | Admitting: "Endocrinology

## 2019-07-30 DIAGNOSIS — C9211 Chronic myeloid leukemia, BCR/ABL-positive, in remission: Secondary | ICD-10-CM | POA: Diagnosis not present

## 2019-07-30 LAB — HEMOGLOBIN A1C
Hgb A1c MFr Bld: 11.1 % of total Hgb — ABNORMAL HIGH (ref <5.7)
Mean Plasma Glucose: 272 (calc)
eAG (mmol/L): 15.1 (calc)

## 2019-07-30 LAB — COMPREHENSIVE METABOLIC PANEL
AG Ratio: 1.4 (calc) (ref 1.0–2.5)
ALT: 24 U/L (ref 9–46)
AST: 19 U/L (ref 10–35)
Albumin: 4.1 g/dL (ref 3.6–5.1)
Alkaline phosphatase (APISO): 73 U/L (ref 35–144)
BUN/Creatinine Ratio: 17 (calc) (ref 6–22)
BUN: 24 mg/dL (ref 7–25)
CO2: 26 mmol/L (ref 20–32)
Calcium: 10.3 mg/dL (ref 8.6–10.3)
Chloride: 106 mmol/L (ref 98–110)
Creat: 1.42 mg/dL — ABNORMAL HIGH (ref 0.70–1.25)
Globulin: 2.9 g/dL (calc) (ref 1.9–3.7)
Glucose, Bld: 165 mg/dL — ABNORMAL HIGH (ref 65–139)
Potassium: 5 mmol/L (ref 3.5–5.3)
Sodium: 138 mmol/L (ref 135–146)
Total Bilirubin: 0.4 mg/dL (ref 0.2–1.2)
Total Protein: 7 g/dL (ref 6.1–8.1)

## 2019-08-01 DIAGNOSIS — C9211 Chronic myeloid leukemia, BCR/ABL-positive, in remission: Secondary | ICD-10-CM | POA: Diagnosis not present

## 2019-08-04 ENCOUNTER — Other Ambulatory Visit: Payer: Self-pay

## 2019-08-04 ENCOUNTER — Other Ambulatory Visit: Payer: Self-pay | Admitting: "Endocrinology

## 2019-08-04 ENCOUNTER — Ambulatory Visit (INDEPENDENT_AMBULATORY_CARE_PROVIDER_SITE_OTHER): Payer: Medicare Other | Admitting: "Endocrinology

## 2019-08-04 ENCOUNTER — Encounter: Payer: Self-pay | Admitting: "Endocrinology

## 2019-08-04 DIAGNOSIS — E785 Hyperlipidemia, unspecified: Secondary | ICD-10-CM | POA: Diagnosis not present

## 2019-08-04 DIAGNOSIS — N183 Chronic kidney disease, stage 3 (moderate): Secondary | ICD-10-CM

## 2019-08-04 DIAGNOSIS — E782 Mixed hyperlipidemia: Secondary | ICD-10-CM

## 2019-08-04 DIAGNOSIS — E1140 Type 2 diabetes mellitus with diabetic neuropathy, unspecified: Secondary | ICD-10-CM | POA: Diagnosis not present

## 2019-08-04 DIAGNOSIS — Z794 Long term (current) use of insulin: Secondary | ICD-10-CM

## 2019-08-04 DIAGNOSIS — E1122 Type 2 diabetes mellitus with diabetic chronic kidney disease: Secondary | ICD-10-CM

## 2019-08-04 DIAGNOSIS — C9210 Chronic myeloid leukemia, BCR/ABL-positive, not having achieved remission: Secondary | ICD-10-CM | POA: Diagnosis not present

## 2019-08-04 DIAGNOSIS — Z23 Encounter for immunization: Secondary | ICD-10-CM | POA: Diagnosis not present

## 2019-08-04 DIAGNOSIS — E1165 Type 2 diabetes mellitus with hyperglycemia: Secondary | ICD-10-CM

## 2019-08-04 DIAGNOSIS — I10 Essential (primary) hypertension: Secondary | ICD-10-CM

## 2019-08-04 DIAGNOSIS — G894 Chronic pain syndrome: Secondary | ICD-10-CM | POA: Diagnosis not present

## 2019-08-04 DIAGNOSIS — IMO0002 Reserved for concepts with insufficient information to code with codable children: Secondary | ICD-10-CM

## 2019-08-04 MED ORDER — TRESIBA FLEXTOUCH 200 UNIT/ML ~~LOC~~ SOPN: 100.0000 [IU] | PEN_INJECTOR | Freq: Every day | SUBCUTANEOUS | 2 refills | Status: DC

## 2019-08-04 NOTE — Progress Notes (Signed)
08/04/2019                                                        Endocrinology Telehealth Visit Follow up Note -During COVID -19 Pandemic  This visit type was conducted due to national recommendations for restrictions regarding the COVID-19 Pandemic  in an effort to limit this patient's exposure and mitigate transmission of the corona virus.  Due to his co-morbid illnesses, Dylan Zelenka. is at  moderate to high risk for complications without adequate follow up.  This format is felt to be most appropriate for him at this time.  I connected with this patient on 08/04/2019   by telephone and verified that I am speaking with the correct person using two identifiers. Dylan Messmer., [birth year]. he has verbally consented to this visit. All issues noted in this document were discussed and addressed. The format was not optimal for physical exam.     Subjective:    Patient ID: Dylan Brittle., male    DOB: [birth year]. Patient is being engaged in telehealth for follow-up   for management of currently uncontrolled, complicated type 2 diabetes requested by  Sharilyn Sites, MD  Past Medical History:  Diagnosis Date  . Chronic back pain   . CML (chronic myelocytic leukemia) ([place])   . Diabetes mellitus without complication ([place])   . History of stress test    myoview study suggested the possibility of basal inferior wall ischemia which was present in all 3 tonographic views.  [name] Hx of echocardiogram 05/2012   EF 55%Mildly hypertrophic left ventricle with normal systolic function and early signs of mild diastolic dysfuntion. Moderately dilated left atrium, mild aortic valve sclerosis with out stenosis.  . Hyperlipemia   . Hypertension   . Leukemia [facility])    Past Surgical History:  Procedure Laterality Date  . BACK SURGERY  12/2006  . CARDIAC CATHETERIZATION  2010   revealed mild nonobstructive CAD  . CARDIAC CATHETERIZATION  07/2012   This showed essentially normal coronary arteries without  obstruction. there was normal LV function.  . COLONOSCOPY WITH PROPOFOL N/A 07/14/2018   Procedure: COLONOSCOPY WITH PROPOFOL;  Surgeon: Daneil Dolin, MD;  Location: [facility];  Service: Endoscopy;  Laterality: N/A;  10:45am  . KNEE SURGERY Left 01/1994  . LEFT HEART CATHETERIZATION WITH CORONARY ANGIOGRAM N/A 07/29/2012   Procedure: LEFT HEART CATHETERIZATION WITH CORONARY ANGIOGRAM;  Surgeon: Troy Sine, MD;  Location: [facility] CATH LAB;  Service: Cardiovascular;  Laterality: N/A;  . POLYPECTOMY  07/14/2018   Procedure: POLYPECTOMY;  Surgeon: Daneil Dolin, MD;  Location: [facility];  Service: Endoscopy;;  colon   Social History   Socioeconomic History  . Marital status: Married    Spouse name: Not on file  . Number of children: Not on file  . Years of education: Not on file  . Highest education level: Not on file  Occupational History  . Not on file  Social Needs  . Financial resource strain: Not on file  . Food insecurity    Worry: Not on file    Inability: Not on file  . Transportation needs    Medical: Not on file    Non-medical: Not on file  Tobacco Use  . Smoking status: Former Smoker    Packs/day: 0.25  Years: 15.00    Pack years: 3.75    Types: Cigarettes    Quit date: 07/08/1987    Years since quitting: 32.0  . Smokeless tobacco: Never Used  . Tobacco comment: "30 years ago"  Substance and Sexual Activity  . Alcohol use: Yes    Comment: "very seldom"  . Drug use: No  . Sexual activity: Yes    Birth control/protection: None  Lifestyle  . Physical activity    Days per week: Not on file    Minutes per session: Not on file  . Stress: Not on file  Relationships  . Social Herbalist on phone: Not on file    Gets together: Not on file    Attends religious service: Not on file    Active member of club or organization: Not on file    Attends meetings of clubs or organizations: Not on file    Relationship status: Not on file  Other Topics  Concern  . Not on file  Social History Narrative  . Not on file   Outpatient Encounter Medications as of 08/04/2019  Medication Sig  . ACCU-CHEK AVIVA PLUS test strip USE TO TEST 3 TIMES DAILY.  [name] ACCU-CHEK SOFTCLIX LANCETS lancets USE TO TEST 3 TIMES DAILY.  [name] amLODipine (NORVASC) 5 MG tablet TAKE 1 TABLET BY MOUTH ONCE A DAY FOR BLOOD PRESSURE.  [name] aspirin EC 81 MG tablet Take 81 mg by mouth daily.  [name] atorvastatin (LIPITOR) 20 MG tablet Take 20 mg by mouth at bedtime.   . B-D ULTRAFINE III SHORT PEN 31G X 8 MM MISC USING WITH INSULIN INJECTIONS 4X DAILY.  . BYSTOLIC 10 MG tablet TAKE ONE TABLET BY MOUTH EVERY DAY. (Patient taking differently: Take 10 mg by mouth daily. )  . Insulin Degludec (TRESIBA FLEXTOUCH) 200 UNIT/ML SOPN Inject 100 Units into the skin at bedtime.  . insulin lispro (HUMALOG) 100 UNIT/ML injection Inject 15 Units into the skin 3 (three) times daily before meals.  . Multiple Vitamin (MULTIVITAMIN WITH MINERALS) TABS Take 1 tablet by mouth daily.  . Oxycodone HCl 10 MG TABS Take 10 mg by mouth daily as needed (for pain.).   [name] ramipril (ALTACE) 10 MG capsule Take 10 mg by mouth daily.  . [DISCONTINUED] TRESIBA FLEXTOUCH 200 UNIT/ML SOPN INJECT 80 UNITS INTO THE SKIN AT BEDTIME.   No facility-administered encounter medications on file as of 08/04/2019.    ALLERGIES: No Known Allergies VACCINATION STATUS:  There is no immunization history on file for this patient.  Diabetes He presents for his follow-up diabetic visit. He has type 2 diabetes mellitus. Onset time: He was diagnosed at approximate age of 63 years. His disease course has been worsening. There are no hypoglycemic associated symptoms. Pertinent negatives for hypoglycemia include no confusion, headaches, pallor or seizures. Pertinent negatives for diabetes include no chest pain, no fatigue, no polydipsia, no polyphagia, no polyuria and no weakness. There are no hypoglycemic complications. Symptoms are worsening.  Diabetic complications include nephropathy. Risk factors for coronary artery disease include diabetes mellitus, dyslipidemia, hypertension, male sex, sedentary lifestyle and tobacco exposure. Current diabetic treatment includes oral agent (dual therapy) and insulin injections. His weight is increasing steadily. He is following a generally unhealthy diet. When asked about meal planning, he reported none. He has not had a previous visit with a dietitian. He never participates in exercise. His home blood glucose trend is decreasing steadily. His breakfast blood glucose range is generally >200 mg/dl. His lunch  blood glucose range is generally >200 mg/dl. His dinner blood glucose range is generally >200 mg/dl. His bedtime blood glucose range is generally >200 mg/dl. His overall blood glucose range is >200 mg/dl. An ACE inhibitor/angiotensin II receptor blocker is being taken. Eye exam is current.  Hyperlipidemia This is a chronic problem. The current episode started more than 1 year ago. Exacerbating diseases include diabetes and obesity. Pertinent negatives include no chest pain, myalgias or shortness of breath. Current antihyperlipidemic treatment includes statins. Risk factors for coronary artery disease include dyslipidemia, hypertension, male sex, a sedentary lifestyle and obesity.  Hypertension This is a chronic problem. The current episode started more than 1 year ago. The problem is uncontrolled. Pertinent negatives include no chest pain, headaches, neck pain, palpitations or shortness of breath. Risk factors for coronary artery disease include dyslipidemia, diabetes mellitus, male gender, obesity and sedentary lifestyle. Past treatments include ACE inhibitors. Hypertensive end-organ damage includes kidney disease.   Review of systems: Limited as above.  Objective:    There were no vitals taken for this visit.  Wt Readings from Last 3 Encounters:  06/17/19 252 lb (114.3 kg)  05/18/19 245 lb (111.1  kg)  12/25/18 246 lb (111.6 kg)    CMP     Component Value Date/Time   NA 138 07/29/2019 1412   NA 139 04/21/2019 0930   K 5.0 07/29/2019 1412   CL 106 07/29/2019 1412   CO2 26 07/29/2019 1412   GLUCOSE 165 (H) 07/29/2019 1412   BUN 24 07/29/2019 1412   BUN 15 04/21/2019 0930   CREATININE 1.42 (H) 07/29/2019 1412   CALCIUM 10.3 07/29/2019 1412   PROT 7.0 07/29/2019 1412   PROT 6.7 04/21/2019 0930   ALBUMIN 4.1 04/21/2019 0930   AST 19 07/29/2019 1412   ALT 24 07/29/2019 1412   ALKPHOS 92 04/21/2019 0930   BILITOT 0.4 07/29/2019 1412   BILITOT 0.3 04/21/2019 0930   GFRNONAA 52 (L) 04/21/2019 0930   GFRNONAA 63 12/17/2018 0952   GFRAA 60 04/21/2019 0930   GFRAA 73 12/17/2018 0952     Diabetic Labs (most recent): Lab Results  Component Value Date   HGBA1C 11.1 (H) 07/29/2019   HGBA1C 10.2 (H) 04/21/2019   HGBA1C 9.0 (H) 12/17/2018    Assessment & Plan:   1. Uncontrolled type 2 diabetes mellitus with worsening chronic kidney disease.  - Patient has currently uncontrolled symptomatic type 2 DM since  63 years of age. - He reports significantly above target glycemic profile, and A1c of 11.1% increasing from 10.2%.     - Recent labs reviewed.   His diabetes is complicated by stage 2-3 CK D and patient remains at a high risk for more acute and chronic complications of diabetes which include CAD, CVA, CKD, retinopathy, and neuropathy. These are all discussed in detail with the patient.  - I have counseled the patient on diet management and weight loss, by adopting a carbohydrate restricted/protein rich diet.  - he  admits there is a room for improvement in his diet and drink choices. -  Suggestion is made for him to avoid simple carbohydrates  from his diet including Cakes, Sweet Desserts / Pastries, Ice Cream, Soda (diet and regular), Sweet Tea, Candies, Chips, Cookies, Sweet Pastries,  Store Bought Juices, Alcohol in Excess of  1-2 drinks a day, Artificial Sweeteners,  Coffee Creamer, and "Sugar-free" Products. This will help patient to have stable blood glucose profile and potentially avoid unintended weight gain.   - I encouraged  the patient to switch to  unprocessed or minimally processed complex starch and increased protein intake (animal or plant source), fruits, and vegetables.  - Patient is advised to stick to a routine mealtimes to eat 3 meals  a day and avoid unnecessary snacks ( to snack only to correct hypoglycemia).   - I have approached patient with the following individualized plan to manage diabetes and patient agrees:   -He will need intensive treatment with higher dose of basal/bolus insulin in order for him to achieve control of diabetes to target.   -He is advised to increase Tresiba to 100 units nightly, increase Humalog to 15 units 3 times daily AC for pre-meal blood glucose readings greater than 90 mg per DL, associated with strict monitoring of blood glucose 4 times a day-before meals and at bedtime.   -Patient is encouraged to call clinic for blood glucose levels less than 70 or above 200 mg /dl.  - His labs show declining renal function, advised to discontinue metformin for now.   - He is advised to avoid any over-the-counter NSAIDs and avoid dehydration.    - Patient specific target  A1c;  LDL, HDL, Triglycerides, and  Waist Circumference were discussed in detail.  2) BP/HTN: he is advised to home monitor blood pressure and report if > 140/90 on 2 separate readings.  He is advised to continue his current blood pressure medications including amlodipine 10 mg p.o. daily, ramipril 5 mg p.o. daily, Bystolic 5 mg p.o. daily.     3) Lipids/HPL: No recent lipid panel to review.  He is advised to continue his atorvastatin 20 mg p.o. nightly. He will be considered for fasting lipid panel on subsequent visits.   4)  Weight/Diet: CDE Consult has been initiated , exercise, and detailed carbohydrates information provided.  5) Chronic  Care/Health Maintenance:  -Patient is on ACEI/ARB and Statin medications and encouraged to continue to follow up with Ophthalmology, Podiatrist at least yearly or according to recommendations, and advised to   stay away from smoking. I have recommended yearly flu vaccine and pneumonia vaccination at least every 5 years; moderate intensity exercise for up to 150 minutes weekly; and  sleep for at least 7 hours a day.  - I advised patient to maintain close follow up with Sharilyn Sites, MD for primary care needs.  - Patient Care Time Today:  25 min, of which >50% was spent in  counseling and the rest reviewing his  current and  previous labs/studies, previous treatments, his blood glucose readings, and medications' doses and developing a plan for long-term care based on the latest recommendations for standards of care.   Dylan Brittle. participated in the discussions, expressed understanding, and voiced agreement with the above plans.  All questions were answered to his satisfaction. he is encouraged to contact clinic should he have any questions or concerns prior to his return visit.   Follow up plan: - Return in about 2 weeks (around 08/18/2019) for Follow up with Meter and Logs Only - no Labs.  Glade Lloyd, MD Phone: [phone]  Fax: [phone]  -  This note was partially dictated with voice recognition software. Similar sounding words can be transcribed inadequately or may not  be corrected upon review.  08/04/2019, 5:04 PM

## 2019-08-06 ENCOUNTER — Ambulatory Visit: Payer: Medicare Other | Admitting: "Endocrinology

## 2019-08-18 ENCOUNTER — Other Ambulatory Visit: Payer: Self-pay

## 2019-08-18 ENCOUNTER — Ambulatory Visit (INDEPENDENT_AMBULATORY_CARE_PROVIDER_SITE_OTHER): Payer: Medicare Other | Admitting: "Endocrinology

## 2019-08-18 ENCOUNTER — Encounter: Payer: Self-pay | Admitting: "Endocrinology

## 2019-08-18 DIAGNOSIS — IMO0002 Reserved for concepts with insufficient information to code with codable children: Secondary | ICD-10-CM

## 2019-08-18 DIAGNOSIS — E1122 Type 2 diabetes mellitus with diabetic chronic kidney disease: Secondary | ICD-10-CM | POA: Diagnosis not present

## 2019-08-18 DIAGNOSIS — E782 Mixed hyperlipidemia: Secondary | ICD-10-CM | POA: Diagnosis not present

## 2019-08-18 DIAGNOSIS — E1165 Type 2 diabetes mellitus with hyperglycemia: Secondary | ICD-10-CM

## 2019-08-18 DIAGNOSIS — N183 Chronic kidney disease, stage 3 (moderate): Secondary | ICD-10-CM | POA: Diagnosis not present

## 2019-08-18 DIAGNOSIS — I10 Essential (primary) hypertension: Secondary | ICD-10-CM

## 2019-08-18 DIAGNOSIS — Z794 Long term (current) use of insulin: Secondary | ICD-10-CM

## 2019-08-18 NOTE — Progress Notes (Signed)
08/18/2019                                                        Endocrinology Telehealth Visit Follow up Note -During COVID -19 Pandemic  This visit type was conducted due to national recommendations for restrictions regarding the COVID-19 Pandemic  in an effort to limit this patient's exposure and mitigate transmission of the corona virus.  Due to his co-morbid illnesses, Dylan Spears. is at  moderate to high risk for complications without adequate follow up.  This format is felt to be most appropriate for him at this time.  I connected with this patient on 08/18/2019   by telephone and verified that I am speaking with the correct person using two identifiers. Dylan Montreuil., 08-Nov-1956. he has verbally consented to this visit. All issues noted in this document were discussed and addressed. The format was not optimal for physical exam.     Subjective:    Patient ID: Dylan Brittle., male    DOB: 1955/12/12. Patient is being engaged in telehealth for follow-up   for management of currently uncontrolled, complicated type 2 diabetes requested by  Sharilyn Sites, MD  Past Medical History:  Diagnosis Date  . Chronic back pain   . CML (chronic myelocytic leukemia) ([place])   . Diabetes mellitus without complication ([place])   . History of stress test    myoview study suggested the possibility of basal inferior wall ischemia which was present in all 3 tonographic views.  [name] Hx of echocardiogram 05/2012   EF 55%Mildly hypertrophic left ventricle with normal systolic function and early signs of mild diastolic dysfuntion. Moderately dilated left atrium, mild aortic valve sclerosis with out stenosis.  . Hyperlipemia   . Hypertension   . Leukemia [facility])    Past Surgical History:  Procedure Laterality Date  . BACK SURGERY  12/2006  . CARDIAC CATHETERIZATION  2010   revealed mild nonobstructive CAD  . CARDIAC CATHETERIZATION  07/2012   This showed essentially normal coronary arteries without  obstruction. there was normal LV function.  . COLONOSCOPY WITH PROPOFOL N/A 07/14/2018   Procedure: COLONOSCOPY WITH PROPOFOL;  Surgeon: Daneil Dolin, MD;  Location: [facility];  Service: Endoscopy;  Laterality: N/A;  10:45am  . KNEE SURGERY Left 01/1994  . LEFT HEART CATHETERIZATION WITH CORONARY ANGIOGRAM N/A 07/29/2012   Procedure: LEFT HEART CATHETERIZATION WITH CORONARY ANGIOGRAM;  Surgeon: Troy Sine, MD;  Location: [facility] CATH LAB;  Service: Cardiovascular;  Laterality: N/A;  . POLYPECTOMY  07/14/2018   Procedure: POLYPECTOMY;  Surgeon: Daneil Dolin, MD;  Location: [facility];  Service: Endoscopy;;  colon   Social History   Socioeconomic History  . Marital status: Married    Spouse name: Not on file  . Number of children: Not on file  . Years of education: Not on file  . Highest education level: Not on file  Occupational History  . Not on file  Social Needs  . Financial resource strain: Not on file  . Food insecurity    Worry: Not on file    Inability: Not on file  . Transportation needs    Medical: Not on file    Non-medical: Not on file  Tobacco Use  . Smoking status: Former Smoker    Packs/day: 0.25  Years: 15.00    Pack years: 3.75    Types: Cigarettes    Quit date: 07/08/1987    Years since quitting: 32.1  . Smokeless tobacco: Never Used  . Tobacco comment: "30 years ago"  Substance and Sexual Activity  . Alcohol use: Yes    Comment: "very seldom"  . Drug use: No  . Sexual activity: Yes    Birth control/protection: None  Lifestyle  . Physical activity    Days per week: Not on file    Minutes per session: Not on file  . Stress: Not on file  Relationships  . Social Herbalist on phone: Not on file    Gets together: Not on file    Attends religious service: Not on file    Active member of club or organization: Not on file    Attends meetings of clubs or organizations: Not on file    Relationship status: Not on file  Other Topics  Concern  . Not on file  Social History Narrative  . Not on file   Outpatient Encounter Medications as of 08/18/2019  Medication Sig  . ACCU-CHEK AVIVA PLUS test strip USE TO TEST 3 TIMES DAILY.  [name] ACCU-CHEK SOFTCLIX LANCETS lancets USE TO TEST 3 TIMES DAILY.  [name] amLODipine (NORVASC) 5 MG tablet TAKE 1 TABLET BY MOUTH ONCE A DAY FOR BLOOD PRESSURE.  [name] aspirin EC 81 MG tablet Take 81 mg by mouth daily.  [name] atorvastatin (LIPITOR) 20 MG tablet Take 20 mg by mouth at bedtime.   . B-D ULTRAFINE III SHORT PEN 31G X 8 MM MISC USING WITH INSULIN INJECTIONS 4X DAILY.  . BYSTOLIC 10 MG tablet TAKE ONE TABLET BY MOUTH EVERY DAY. (Patient taking differently: Take 10 mg by mouth daily. )  . Insulin Degludec (TRESIBA FLEXTOUCH) 200 UNIT/ML SOPN Inject 100 Units into the skin at bedtime.  . insulin lispro (HUMALOG) 100 UNIT/ML injection Inject 18 Units into the skin 3 (three) times daily before meals.  . Multiple Vitamin (MULTIVITAMIN WITH MINERALS) TABS Take 1 tablet by mouth daily.  . Oxycodone HCl 10 MG TABS Take 10 mg by mouth daily as needed (for pain.).   [name] ramipril (ALTACE) 10 MG capsule Take 10 mg by mouth daily.   No facility-administered encounter medications on file as of 08/18/2019.    ALLERGIES: No Known Allergies VACCINATION STATUS:  There is no immunization history on file for this patient.  Diabetes He presents for his follow-up diabetic visit. He has type 2 diabetes mellitus. Onset time: He was diagnosed at approximate age of 1 years. His disease course has been worsening. There are no hypoglycemic associated symptoms. Pertinent negatives for hypoglycemia include no confusion, headaches, pallor or seizures. Pertinent negatives for diabetes include no chest pain, no fatigue, no polydipsia, no polyphagia, no polyuria and no weakness. There are no hypoglycemic complications. Symptoms are improving. Diabetic complications include nephropathy. Risk factors for coronary artery disease include  diabetes mellitus, dyslipidemia, hypertension, male sex, sedentary lifestyle and tobacco exposure. Current diabetic treatment includes oral agent (dual therapy) and insulin injections. His weight is increasing steadily. He is following a generally unhealthy diet. When asked about meal planning, he reported none. He has not had a previous visit with a dietitian. He never participates in exercise. His home blood glucose trend is decreasing steadily. His breakfast blood glucose range is generally 180-200 mg/dl. His lunch blood glucose range is generally >200 mg/dl. His dinner blood glucose range is generally >200 mg/dl.  His bedtime blood glucose range is generally >200 mg/dl. His overall blood glucose range is >200 mg/dl. An ACE inhibitor/angiotensin II receptor blocker is being taken. Eye exam is current.  Hyperlipidemia This is a chronic problem. The current episode started more than 1 year ago. Exacerbating diseases include diabetes and obesity. Pertinent negatives include no chest pain, myalgias or shortness of breath. Current antihyperlipidemic treatment includes statins. Risk factors for coronary artery disease include dyslipidemia, hypertension, male sex, a sedentary lifestyle and obesity.  Hypertension This is a chronic problem. The current episode started more than 1 year ago. The problem is uncontrolled. Pertinent negatives include no chest pain, headaches, neck pain, palpitations or shortness of breath. Risk factors for coronary artery disease include dyslipidemia, diabetes mellitus, male gender, obesity and sedentary lifestyle. Past treatments include ACE inhibitors. Hypertensive end-organ damage includes kidney disease.   Review of systems: Limited as above.  Objective:    There were no vitals taken for this visit.  Wt Readings from Last 3 Encounters:  06/17/19 252 lb (114.3 kg)  05/18/19 245 lb (111.1 kg)  12/25/18 246 lb (111.6 kg)    CMP     Component Value Date/Time   NA 138  07/29/2019 1412   NA 139 04/21/2019 0930   K 5.0 07/29/2019 1412   CL 106 07/29/2019 1412   CO2 26 07/29/2019 1412   GLUCOSE 165 (H) 07/29/2019 1412   BUN 24 07/29/2019 1412   BUN 15 04/21/2019 0930   CREATININE 1.42 (H) 07/29/2019 1412   CALCIUM 10.3 07/29/2019 1412   PROT 7.0 07/29/2019 1412   PROT 6.7 04/21/2019 0930   ALBUMIN 4.1 04/21/2019 0930   AST 19 07/29/2019 1412   ALT 24 07/29/2019 1412   ALKPHOS 92 04/21/2019 0930   BILITOT 0.4 07/29/2019 1412   BILITOT 0.3 04/21/2019 0930   GFRNONAA 52 (L) 04/21/2019 0930   GFRNONAA 63 12/17/2018 0952   GFRAA 60 04/21/2019 0930   GFRAA 73 12/17/2018 0952     Diabetic Labs (most recent): Lab Results  Component Value Date   HGBA1C 11.1 (H) 07/29/2019   HGBA1C 10.2 (H) 04/21/2019   HGBA1C 9.0 (H) 12/17/2018    Assessment & Plan:   1. Uncontrolled type 2 diabetes mellitus with worsening chronic kidney disease.  - Patient has currently uncontrolled symptomatic type 2 DM since  63 years of age. - He reports slightly improving glycemic profile: Fasting between 131-237, prelunch between 90-229, presupper between 142-256, bedtime between 242-327 . -He is recent previsit labs show A1c of 11.1%, increasing from 10.2%   - Recent labs reviewed.   His diabetes is complicated by stage 2-3 CK D and patient remains at a high risk for more acute and chronic complications of diabetes which include CAD, CVA, CKD, retinopathy, and neuropathy. These are all discussed in detail with the patient.  - I have counseled the patient on diet management and weight loss, by adopting a carbohydrate restricted/protein rich diet.  - he  admits there is a room for improvement in his diet and drink choices. -  Suggestion is made for him to avoid simple carbohydrates  from his diet including Cakes, Sweet Desserts / Pastries, Ice Cream, Soda (diet and regular), Sweet Tea, Candies, Chips, Cookies, Sweet Pastries,  Store Bought Juices, Alcohol in Excess of  1-2  drinks a day, Artificial Sweeteners, Coffee Creamer, and "Sugar-free" Products. This will help patient to have stable blood glucose profile and potentially avoid unintended weight gain.   - I encouraged the  patient to switch to  unprocessed or minimally processed complex starch and increased protein intake (animal or plant source), fruits, and vegetables.  - Patient is advised to stick to a routine mealtimes to eat 3 meals  a day and avoid unnecessary snacks ( to snack only to correct hypoglycemia).   - I have approached patient with the following individualized plan to manage diabetes and patient agrees:   -He will continue to need  intensive treatment with higher dose of basal/bolus insulin in order for him to achieve and maintain control of diabetes to target.    --He is advised to continue Tresiba at 100 units nightly, increase Humalog to 20  units 3 times daily AC for pre-meal blood glucose readings greater than 90 mg per DL, associated with strict monitoring of blood glucose 4 times a day-before meals and at bedtime.   -Patient is encouraged to call clinic for blood glucose levels less than 70 or above 200 mg /dl.  - His labs show declining renal function, advised to discontinue metformin for now.   - He is advised to avoid any over-the-counter NSAIDs and avoid dehydration.    - Patient specific target  A1c;  LDL, HDL, Triglycerides, and  Waist Circumference were discussed in detail.  2) BP/HTN: he is advised to home monitor blood pressure and report if > 140/90 on 2 separate readings.  He is advised to continue his current blood pressure medications including amlodipine 10 mg p.o. daily, ramipril 5 mg p.o. daily, Bystolic 5 mg p.o. daily.     3) Lipids/HPL: No recent lipid panel to review.  He is advised to continue atorvastatin 20 mg p.o. nightly. He will be considered for fasting lipid panel on subsequent visits.   4)  Weight/Diet: CDE Consult has been initiated , exercise, and  detailed carbohydrates information provided.  5) Chronic Care/Health Maintenance:  -Patient is on ACEI/ARB and Statin medications and encouraged to continue to follow up with Ophthalmology, Podiatrist at least yearly or according to recommendations, and advised to   stay away from smoking. I have recommended yearly flu vaccine and pneumonia vaccination at least every 5 years; moderate intensity exercise for up to 150 minutes weekly; and  sleep for at least 7 hours a day.  - I advised patient to maintain close follow up with Sharilyn Sites, MD for primary care needs.  - Patient Care Time Today:  25 min, of which >50% was spent in  counseling and the rest reviewing his  current and  previous labs/studies, previous treatments, his blood glucose readings, and medications' doses and developing a plan for long-term care based on the latest recommendations for standards of care.   Dylan Brittle. participated in the discussions, expressed understanding, and voiced agreement with the above plans.  All questions were answered to his satisfaction. he is encouraged to contact clinic should he have any questions or concerns prior to his return visit.   Follow up plan: - Return in about 3 months (around 11/17/2019) for Bring Meter and Logs- A1c in Office, Include 8 log sheets.  Glade Lloyd, MD Phone: [phone]  Fax: [phone]  -  This note was partially dictated with voice recognition software. Similar sounding words can be transcribed inadequately or may not  be corrected upon review.  08/18/2019, 6:08 PM

## 2019-09-03 ENCOUNTER — Other Ambulatory Visit: Payer: Self-pay | Admitting: "Endocrinology

## 2019-09-10 DIAGNOSIS — E1121 Type 2 diabetes mellitus with diabetic nephropathy: Secondary | ICD-10-CM | POA: Diagnosis not present

## 2019-09-10 DIAGNOSIS — E1122 Type 2 diabetes mellitus with diabetic chronic kidney disease: Secondary | ICD-10-CM | POA: Diagnosis not present

## 2019-09-10 DIAGNOSIS — N182 Chronic kidney disease, stage 2 (mild): Secondary | ICD-10-CM | POA: Diagnosis not present

## 2019-09-10 DIAGNOSIS — C9211 Chronic myeloid leukemia, BCR/ABL-positive, in remission: Secondary | ICD-10-CM | POA: Diagnosis not present

## 2019-09-15 DIAGNOSIS — C9211 Chronic myeloid leukemia, BCR/ABL-positive, in remission: Secondary | ICD-10-CM | POA: Diagnosis not present

## 2019-09-18 DIAGNOSIS — M25562 Pain in left knee: Secondary | ICD-10-CM | POA: Diagnosis not present

## 2019-09-18 DIAGNOSIS — M25561 Pain in right knee: Secondary | ICD-10-CM | POA: Diagnosis not present

## 2019-09-22 ENCOUNTER — Other Ambulatory Visit: Payer: Self-pay | Admitting: "Endocrinology

## 2019-09-25 DIAGNOSIS — I129 Hypertensive chronic kidney disease with stage 1 through stage 4 chronic kidney disease, or unspecified chronic kidney disease: Secondary | ICD-10-CM | POA: Diagnosis not present

## 2019-09-25 DIAGNOSIS — E1122 Type 2 diabetes mellitus with diabetic chronic kidney disease: Secondary | ICD-10-CM | POA: Diagnosis not present

## 2019-09-25 DIAGNOSIS — Z794 Long term (current) use of insulin: Secondary | ICD-10-CM | POA: Diagnosis not present

## 2019-09-25 DIAGNOSIS — E1140 Type 2 diabetes mellitus with diabetic neuropathy, unspecified: Secondary | ICD-10-CM | POA: Diagnosis not present

## 2019-10-20 DIAGNOSIS — C9211 Chronic myeloid leukemia, BCR/ABL-positive, in remission: Secondary | ICD-10-CM | POA: Diagnosis not present

## 2019-10-21 DIAGNOSIS — C9211 Chronic myeloid leukemia, BCR/ABL-positive, in remission: Secondary | ICD-10-CM | POA: Diagnosis not present

## 2019-10-26 DIAGNOSIS — I129 Hypertensive chronic kidney disease with stage 1 through stage 4 chronic kidney disease, or unspecified chronic kidney disease: Secondary | ICD-10-CM | POA: Diagnosis not present

## 2019-10-26 DIAGNOSIS — N1832 Chronic kidney disease, stage 3b: Secondary | ICD-10-CM | POA: Diagnosis not present

## 2019-10-26 DIAGNOSIS — E1122 Type 2 diabetes mellitus with diabetic chronic kidney disease: Secondary | ICD-10-CM | POA: Diagnosis not present

## 2019-10-26 DIAGNOSIS — E1140 Type 2 diabetes mellitus with diabetic neuropathy, unspecified: Secondary | ICD-10-CM | POA: Diagnosis not present

## 2019-10-27 ENCOUNTER — Other Ambulatory Visit: Payer: Self-pay

## 2019-10-27 MED ORDER — TRESIBA FLEXTOUCH 200 UNIT/ML ~~LOC~~ SOPN: 100.0000 [IU] | PEN_INJECTOR | Freq: Every day | SUBCUTANEOUS | 3 refills | Status: DC

## 2019-10-27 MED ORDER — INSULIN LISPRO (1 UNIT DIAL) 100 UNIT/ML (KWIKPEN): 18.0000 [IU] | PEN_INJECTOR | Freq: Three times a day (TID) | SUBCUTANEOUS | 3 refills | Status: DC

## 2019-11-05 DIAGNOSIS — E7849 Other hyperlipidemia: Secondary | ICD-10-CM | POA: Diagnosis not present

## 2019-11-05 DIAGNOSIS — I10 Essential (primary) hypertension: Secondary | ICD-10-CM | POA: Diagnosis not present

## 2019-11-05 DIAGNOSIS — E1165 Type 2 diabetes mellitus with hyperglycemia: Secondary | ICD-10-CM | POA: Diagnosis not present

## 2019-11-05 DIAGNOSIS — G894 Chronic pain syndrome: Secondary | ICD-10-CM | POA: Diagnosis not present

## 2019-11-16 ENCOUNTER — Telehealth: Payer: Self-pay | Admitting: "Endocrinology

## 2019-11-16 ENCOUNTER — Other Ambulatory Visit: Payer: Self-pay | Admitting: "Endocrinology

## 2019-11-16 MED ORDER — BD PEN NEEDLE SHORT U/F 31G X 8 MM MISC: 5 refills | Status: DC

## 2019-11-16 MED ORDER — ACCU-CHEK AVIVA PLUS VI STRP: ORAL_STRIP | 5 refills | Status: DC

## 2019-11-16 NOTE — Telephone Encounter (Signed)
Pt came in and said that he is almost out of his test strips and last visit he said you changed him to test 4 times a day in stead of 3. He also needs the script changed for the pen needles too so that he can pick them both up at the same time.    [employer]

## 2019-11-24 ENCOUNTER — Other Ambulatory Visit: Payer: Self-pay

## 2019-11-24 ENCOUNTER — Ambulatory Visit (INDEPENDENT_AMBULATORY_CARE_PROVIDER_SITE_OTHER): Payer: Medicare Other | Admitting: "Endocrinology

## 2019-11-24 ENCOUNTER — Encounter: Payer: Self-pay | Admitting: "Endocrinology

## 2019-11-24 VITALS — BP 158/83 | HR 70 | Ht 73.0 in | Wt 264.0 lb

## 2019-11-24 DIAGNOSIS — IMO0002 Reserved for concepts with insufficient information to code with codable children: Secondary | ICD-10-CM

## 2019-11-24 DIAGNOSIS — E1165 Type 2 diabetes mellitus with hyperglycemia: Secondary | ICD-10-CM

## 2019-11-24 DIAGNOSIS — E1122 Type 2 diabetes mellitus with diabetic chronic kidney disease: Secondary | ICD-10-CM | POA: Diagnosis not present

## 2019-11-24 DIAGNOSIS — I10 Essential (primary) hypertension: Secondary | ICD-10-CM

## 2019-11-24 DIAGNOSIS — E1140 Type 2 diabetes mellitus with diabetic neuropathy, unspecified: Secondary | ICD-10-CM | POA: Diagnosis not present

## 2019-11-24 DIAGNOSIS — E782 Mixed hyperlipidemia: Secondary | ICD-10-CM | POA: Diagnosis not present

## 2019-11-24 DIAGNOSIS — N183 Chronic kidney disease, stage 3 unspecified: Secondary | ICD-10-CM | POA: Diagnosis not present

## 2019-11-24 DIAGNOSIS — Z794 Long term (current) use of insulin: Secondary | ICD-10-CM

## 2019-11-24 LAB — POCT GLYCOSYLATED HEMOGLOBIN (HGB A1C): Hemoglobin A1C: 8.5 % — AB (ref 4.0–5.6)

## 2019-11-24 NOTE — Progress Notes (Signed)
11/24/2019                  Endocrinology follow-up note    Subjective:    Patient ID: Dylan Spears., male    DOB: 1956-04-28. Patient is being seen in follow-up  for management of currently uncontrolled, complicated type 2 diabetes requested by  Sharilyn Sites, MD  Past Medical History:  Diagnosis Date  . Chronic back pain   . CML (chronic myelocytic leukemia) ([place])   . Diabetes mellitus without complication ([place])   . History of stress test    myoview study suggested the possibility of basal inferior wall ischemia which was present in all 3 tonographic views.  [name] Hx of echocardiogram 05/2012   EF 55%Mildly hypertrophic left ventricle with normal systolic function and early signs of mild diastolic dysfuntion. Moderately dilated left atrium, mild aortic valve sclerosis with out stenosis.  . Hyperlipemia   . Hypertension   . Leukemia [facility])    Past Surgical History:  Procedure Laterality Date  . BACK SURGERY  12/2006  . CARDIAC CATHETERIZATION  2010   revealed mild nonobstructive CAD  . CARDIAC CATHETERIZATION  07/2012   This showed essentially normal coronary arteries without obstruction. there was normal LV function.  . COLONOSCOPY WITH PROPOFOL N/A 07/14/2018   Procedure: COLONOSCOPY WITH PROPOFOL;  Surgeon: Daneil Dolin, MD;  Location: [facility];  Service: Endoscopy;  Laterality: N/A;  10:45am  . KNEE SURGERY Left 01/1994  . LEFT HEART CATHETERIZATION WITH CORONARY ANGIOGRAM N/A 07/29/2012   Procedure: LEFT HEART CATHETERIZATION WITH CORONARY ANGIOGRAM;  Surgeon: Troy Sine, MD;  Location: [facility] CATH LAB;  Service: Cardiovascular;  Laterality: N/A;  . POLYPECTOMY  07/14/2018   Procedure: POLYPECTOMY;  Surgeon: Daneil Dolin, MD;  Location: [facility];  Service: Endoscopy;;  colon   Social History   Socioeconomic History  . Marital status: Married    Spouse name: Not on file  . Number of children: Not on file  . Years of education: Not on file  . Highest  education level: Not on file  Occupational History  . Not on file  Tobacco Use  . Smoking status: Former Smoker    Packs/day: 0.25    Years: 15.00    Pack years: 3.75    Types: Cigarettes    Quit date: 07/08/1987    Years since quitting: 32.4  . Smokeless tobacco: Never Used  . Tobacco comment: "30 years ago"  Substance and Sexual Activity  . Alcohol use: Yes    Comment: "very seldom"  . Drug use: No  . Sexual activity: Yes    Birth control/protection: None  Other Topics Concern  . Not on file  Social History Narrative  . Not on file   Social Determinants of Health   Financial Resource Strain:   . Difficulty of Paying Living Expenses: Not on file  Food Insecurity:   . Worried About Charity fundraiser in the Last Year: Not on file  . Ran Out of Food in the Last Year: Not on file  Transportation Needs:   . Lack of Transportation (Medical): Not on file  . Lack of Transportation (Non-Medical): Not on file  Physical Activity:   . Days of Exercise per Week: Not on file  . Minutes of Exercise per Session: Not on file  Stress:   . Feeling of Stress : Not on file  Social Connections:   . Frequency of Communication with Friends and Family: Not on file  .  Frequency of Social Gatherings with Friends and Family: Not on file  . Attends Religious Services: Not on file  . Active Member of Clubs or Organizations: Not on file  . Attends Archivist Meetings: Not on file  . Marital Status: Not on file   Outpatient Encounter Medications as of 11/24/2019  Medication Sig  . amLODipine (NORVASC) 5 MG tablet TAKE 1 TABLET BY MOUTH ONCE A DAY FOR BLOOD PRESSURE.  [name] aspirin EC 81 MG tablet Take 81 mg by mouth daily.  [name] BYSTOLIC 10 MG tablet TAKE ONE TABLET BY MOUTH EVERY DAY. (Patient taking differently: Take 10 mg by mouth daily. )  . Insulin Degludec (TRESIBA FLEXTOUCH) 200 UNIT/ML SOPN Inject 100 Units into the skin at bedtime.  . insulin lispro (HUMALOG) 100 UNIT/ML KwikPen  Inject 0.18-0.24 mLs (18-24 Units total) into the skin 3 (three) times daily before meals.  . Multiple Vitamin (MULTIVITAMIN WITH MINERALS) TABS Take 1 tablet by mouth daily.  [name] olmesartan (BENICAR) 40 MG tablet Take 40 mg by mouth daily.  . Oxycodone HCl 10 MG TABS Take 10 mg by mouth daily as needed (for pain.).   [name] rosuvastatin (CRESTOR) 20 MG tablet Take 20 mg by mouth daily.  . Accu-Chek Softclix Lancets lancets USE TO TEST 3 TIMES DAILY.  [name] glucose blood (ACCU-CHEK AVIVA PLUS) test strip Use as instructed, test 4 x a day  . Insulin Pen Needle (B-D ULTRAFINE III SHORT PEN) 31G X 8 MM MISC USING WITH INSULIN INJECTIONS 4X DAILY.  . [DISCONTINUED] atorvastatin (LIPITOR) 20 MG tablet Take 20 mg by mouth at bedtime.   . [DISCONTINUED] ramipril (ALTACE) 10 MG capsule Take 10 mg by mouth daily.   No facility-administered encounter medications on file as of 11/24/2019.   ALLERGIES: No Known Allergies VACCINATION STATUS:  There is no immunization history on file for this patient.  Diabetes He presents for his follow-up diabetic visit. He has type 2 diabetes mellitus. Onset time: He was diagnosed at approximate age of 41 years. His disease course has been worsening. There are no hypoglycemic associated symptoms. Pertinent negatives for hypoglycemia include no confusion, headaches, pallor or seizures. Pertinent negatives for diabetes include no chest pain, no fatigue, no polydipsia, no polyphagia, no polyuria and no weakness. There are no hypoglycemic complications. Symptoms are improving. Diabetic complications include nephropathy. Risk factors for coronary artery disease include diabetes mellitus, dyslipidemia, hypertension, male sex, sedentary lifestyle and tobacco exposure. Current diabetic treatment includes oral agent (dual therapy) and insulin injections. His weight is increasing steadily. He is following a generally unhealthy diet. When asked about meal planning, he reported none. He has not  had a previous visit with a dietitian. He never participates in exercise. His home blood glucose trend is decreasing steadily. His breakfast blood glucose range is generally 180-200 mg/dl. His lunch blood glucose range is generally >200 mg/dl. His dinner blood glucose range is generally >200 mg/dl. His bedtime blood glucose range is generally >200 mg/dl. His overall blood glucose range is >200 mg/dl. An ACE inhibitor/angiotensin II receptor blocker is being taken. Eye exam is current.  Hyperlipidemia This is a chronic problem. The current episode started more than 1 year ago. Exacerbating diseases include diabetes and obesity. Pertinent negatives include no chest pain, myalgias or shortness of breath. Current antihyperlipidemic treatment includes statins. Risk factors for coronary artery disease include dyslipidemia, hypertension, male sex, a sedentary lifestyle and obesity.  Hypertension This is a chronic problem. The current episode started more than 1 year  ago. The problem is uncontrolled. Pertinent negatives include no chest pain, headaches, neck pain, palpitations or shortness of breath. Risk factors for coronary artery disease include dyslipidemia, diabetes mellitus, male gender, obesity and sedentary lifestyle. Past treatments include ACE inhibitors. Hypertensive end-organ damage includes kidney disease.   Review of systems: Limited as above.  Objective:    BP (!) 158/83   Pulse 70   Ht 6\' 1"  (1.854 m)   Wt 264 lb (119.7 kg)   BMI 34.83 kg/m   Wt Readings from Last 3 Encounters:  11/24/19 264 lb (119.7 kg)  06/17/19 252 lb (114.3 kg)  05/18/19 245 lb (111.1 kg)     Physical Exam- Limited  Constitutional:  Body mass index is 34.83 kg/m. , not in acute distress, normal state of mind Eyes:  EOMI, no exophthalmos Neck: Supple Respiratory: Adequate breathing efforts Musculoskeletal: no gross deformities, strength intact in all four extremities, no gross restriction of joint  movements Skin:  no rashes, no hyperemia Neurological: no tremor with outstretched hands.  CMP     Component Value Date/Time   NA 138 07/29/2019 1412   NA 139 04/21/2019 0930   K 5.0 07/29/2019 1412   CL 106 07/29/2019 1412   CO2 26 07/29/2019 1412   GLUCOSE 165 (H) 07/29/2019 1412   BUN 24 07/29/2019 1412   BUN 15 04/21/2019 0930   CREATININE 1.42 (H) 07/29/2019 1412   CALCIUM 10.3 07/29/2019 1412   PROT 7.0 07/29/2019 1412   PROT 6.7 04/21/2019 0930   ALBUMIN 4.1 04/21/2019 0930   AST 19 07/29/2019 1412   ALT 24 07/29/2019 1412   ALKPHOS 92 04/21/2019 0930   BILITOT 0.4 07/29/2019 1412   BILITOT 0.3 04/21/2019 0930   GFRNONAA 52 (L) 04/21/2019 0930   GFRNONAA 63 12/17/2018 0952   GFRAA 60 04/21/2019 0930   GFRAA 73 12/17/2018 0952    Diabetic Labs (most recent): Lab Results  Component Value Date   HGBA1C 8.5 (A) 11/24/2019   HGBA1C 11.1 (H) 07/29/2019   HGBA1C 10.2 (H) 04/21/2019     Assessment & Plan:   1. Uncontrolled type 2 diabetes mellitus with worsening chronic kidney disease.  - Patient has currently uncontrolled symptomatic type 2 DM since  63 years of age. - He presents with significantly improved glycemic profile, no hypoglycemia.  His point-of-care A1c is 8.5% improving from 11.1%.  He has overcorrected for glycemic readings in the 80s.  That led to skipping of prandial insulin and subsequent above target readings.   - Recent labs reviewed.   His diabetes is complicated by stage 2-3 CK D and patient remains at a high risk for more acute and chronic complications of diabetes which include CAD, CVA, CKD, retinopathy, and neuropathy. These are all discussed in detail with the patient.  - I have counseled the patient on diet management and weight loss, by adopting a carbohydrate restricted/protein rich diet. - he  admits there is a room for improvement in his diet and drink choices. -  Suggestion is made for him to avoid simple carbohydrates  from his  diet including Cakes, Sweet Desserts / Pastries, Ice Cream, Soda (diet and regular), Sweet Tea, Candies, Chips, Cookies, Sweet Pastries,  Store Bought Juices, Alcohol in Excess of  1-2 drinks a day, Artificial Sweeteners, Coffee Creamer, and "Sugar-free" Products. This will help patient to have stable blood glucose profile and potentially avoid unintended weight gain.   - I encouraged the patient to switch to  unprocessed or minimally processed  complex starch and increased protein intake (animal or plant source), fruits, and vegetables.  - Patient is advised to stick to a routine mealtimes to eat 3 meals  a day and avoid unnecessary snacks ( to snack only to correct hypoglycemia).   - I have approached patient with the following individualized plan to manage diabetes and patient agrees:   -He will continue to need  intensive treatment with higher dose of basal/bolus insulin in order for him to achieve and maintain control of diabetes to target.    --He is advised to continue Tresiba 100 units nightly, continue Humalog 18 units 3 times daily AC  for pre-meal blood glucose readings greater than 70 mg per DL, associated with strict monitoring of blood glucose 4 times a day-before meals and at bedtime.   -Patient is encouraged to call clinic for blood glucose levels less than 70 or above 200 mg /dl.  - His labs show declining renal function, advised to discontinue metformin for now.   - He is advised to avoid any over-the-counter NSAIDs and avoid dehydration.    - Patient specific target  A1c;  LDL, HDL, Triglycerides, and  Waist Circumference were discussed in detail.  2) BP/HTN:  His blood pressure is not controlled to target.  He is advised to continue his current blood pressure medications including amlodipine 10 mg p.o. daily, ramipril 5 mg p.o. daily, Bystolic 5 mg p.o. daily.     3) Lipids/HPL: No recent lipid panel to review.  He is advised to continue atorvastatin 20 mg p.o. nightly.   He will be considered for fasting lipid panel before his next visit.      4)  Weight/Diet: CDE Consult has been initiated , exercise, and detailed carbohydrates information provided.  5) Chronic Care/Health Maintenance:  -Patient is on ACEI/ARB and Statin medications and encouraged to continue to follow up with Ophthalmology, Podiatrist at least yearly or according to recommendations, and advised to   stay away from smoking. I have recommended yearly flu vaccine and pneumonia vaccination at least every 5 years; moderate intensity exercise for up to 150 minutes weekly; and  sleep for at least 7 hours a day.  - I advised patient to maintain close follow up with Sharilyn Sites, MD for primary care needs.  - Time spent with the patient: 25 min, of which >50% was spent in reviewing his blood glucose logs , discussing his hypoglycemia and hyperglycemia episodes, reviewing his current and  previous labs / studies and medications  doses and developing a plan to avoid hypoglycemia and hyperglycemia. Please refer to Patient Instructions for Blood Glucose Monitoring and Insulin/Medications Dosing Guide"  in media tab for additional information. Please  also refer to " Patient Self Inventory" in the Media  tab for reviewed elements of pertinent patient history.  Merrily Brittle. participated in the discussions, expressed understanding, and voiced agreement with the above plans.  All questions were answered to his satisfaction. he is encouraged to contact clinic should he have any questions or concerns prior to his return visit.    Follow up plan: - Return in about 4 months (around 03/24/2020) for Follow up with Pre-visit Labs, Next Visit A1c in Office.  Glade Lloyd, MD Phone: [phone]  Fax: [phone]  -  This note was partially dictated with voice recognition software. Similar sounding words can be transcribed inadequately or may not  be corrected upon review.  11/24/2019, 3:51 PM

## 2019-11-24 NOTE — Patient Instructions (Signed)

## 2019-12-01 DIAGNOSIS — N182 Chronic kidney disease, stage 2 (mild): Secondary | ICD-10-CM | POA: Diagnosis not present

## 2019-12-01 DIAGNOSIS — I10 Essential (primary) hypertension: Secondary | ICD-10-CM | POA: Diagnosis not present

## 2019-12-01 DIAGNOSIS — M170 Bilateral primary osteoarthritis of knee: Secondary | ICD-10-CM | POA: Diagnosis not present

## 2019-12-01 DIAGNOSIS — M1712 Unilateral primary osteoarthritis, left knee: Secondary | ICD-10-CM | POA: Diagnosis not present

## 2019-12-01 DIAGNOSIS — M1611 Unilateral primary osteoarthritis, right hip: Secondary | ICD-10-CM | POA: Diagnosis not present

## 2019-12-01 DIAGNOSIS — C9210 Chronic myeloid leukemia, BCR/ABL-positive, not having achieved remission: Secondary | ICD-10-CM | POA: Diagnosis not present

## 2019-12-01 DIAGNOSIS — M8588 Other specified disorders of bone density and structure, other site: Secondary | ICD-10-CM | POA: Diagnosis not present

## 2019-12-01 DIAGNOSIS — M247 Protrusio acetabuli: Secondary | ICD-10-CM | POA: Diagnosis not present

## 2019-12-01 DIAGNOSIS — E1169 Type 2 diabetes mellitus with other specified complication: Secondary | ICD-10-CM | POA: Diagnosis not present

## 2019-12-03 DIAGNOSIS — C9211 Chronic myeloid leukemia, BCR/ABL-positive, in remission: Secondary | ICD-10-CM | POA: Diagnosis not present

## 2019-12-04 DIAGNOSIS — N182 Chronic kidney disease, stage 2 (mild): Secondary | ICD-10-CM | POA: Diagnosis not present

## 2019-12-04 DIAGNOSIS — E559 Vitamin D deficiency, unspecified: Secondary | ICD-10-CM | POA: Diagnosis not present

## 2019-12-04 DIAGNOSIS — D631 Anemia in chronic kidney disease: Secondary | ICD-10-CM | POA: Diagnosis not present

## 2019-12-04 DIAGNOSIS — I10 Essential (primary) hypertension: Secondary | ICD-10-CM | POA: Diagnosis not present

## 2019-12-04 DIAGNOSIS — R809 Proteinuria, unspecified: Secondary | ICD-10-CM | POA: Diagnosis not present

## 2019-12-08 DIAGNOSIS — C9211 Chronic myeloid leukemia, BCR/ABL-positive, in remission: Secondary | ICD-10-CM | POA: Diagnosis not present

## 2019-12-11 DIAGNOSIS — E211 Secondary hyperparathyroidism, not elsewhere classified: Secondary | ICD-10-CM | POA: Diagnosis not present

## 2019-12-11 DIAGNOSIS — I129 Hypertensive chronic kidney disease with stage 1 through stage 4 chronic kidney disease, or unspecified chronic kidney disease: Secondary | ICD-10-CM | POA: Diagnosis not present

## 2019-12-11 DIAGNOSIS — E559 Vitamin D deficiency, unspecified: Secondary | ICD-10-CM | POA: Diagnosis not present

## 2019-12-11 DIAGNOSIS — R809 Proteinuria, unspecified: Secondary | ICD-10-CM | POA: Diagnosis not present

## 2019-12-11 DIAGNOSIS — N189 Chronic kidney disease, unspecified: Secondary | ICD-10-CM | POA: Diagnosis not present

## 2019-12-14 DIAGNOSIS — M2550 Pain in unspecified joint: Secondary | ICD-10-CM | POA: Diagnosis not present

## 2019-12-14 DIAGNOSIS — E1165 Type 2 diabetes mellitus with hyperglycemia: Secondary | ICD-10-CM | POA: Diagnosis not present

## 2019-12-14 DIAGNOSIS — Z1389 Encounter for screening for other disorder: Secondary | ICD-10-CM | POA: Diagnosis not present

## 2019-12-14 DIAGNOSIS — Z0001 Encounter for general adult medical examination with abnormal findings: Secondary | ICD-10-CM | POA: Diagnosis not present

## 2019-12-15 ENCOUNTER — Encounter: Payer: Self-pay | Admitting: Cardiovascular Disease

## 2019-12-15 ENCOUNTER — Ambulatory Visit: Payer: Medicare Other | Admitting: Cardiovascular Disease

## 2019-12-15 ENCOUNTER — Other Ambulatory Visit: Payer: Self-pay

## 2019-12-15 VITALS — BP 144/84 | HR 72 | Temp 97.0°F | Ht 73.0 in | Wt 259.6 lb

## 2019-12-15 DIAGNOSIS — E1165 Type 2 diabetes mellitus with hyperglycemia: Secondary | ICD-10-CM | POA: Diagnosis not present

## 2019-12-15 DIAGNOSIS — E782 Mixed hyperlipidemia: Secondary | ICD-10-CM

## 2019-12-15 DIAGNOSIS — I10 Essential (primary) hypertension: Secondary | ICD-10-CM

## 2019-12-15 DIAGNOSIS — E669 Obesity, unspecified: Secondary | ICD-10-CM

## 2019-12-15 DIAGNOSIS — E1140 Type 2 diabetes mellitus with diabetic neuropathy, unspecified: Secondary | ICD-10-CM | POA: Diagnosis not present

## 2019-12-15 DIAGNOSIS — E785 Hyperlipidemia, unspecified: Secondary | ICD-10-CM

## 2019-12-15 DIAGNOSIS — I517 Cardiomegaly: Secondary | ICD-10-CM

## 2019-12-15 DIAGNOSIS — C9210 Chronic myeloid leukemia, BCR/ABL-positive, not having achieved remission: Secondary | ICD-10-CM

## 2019-12-15 DIAGNOSIS — N289 Disorder of kidney and ureter, unspecified: Secondary | ICD-10-CM | POA: Diagnosis not present

## 2019-12-15 NOTE — Progress Notes (Addendum)
Patient ID: Treyon Wymore., male   DOB: 1956/02/15, 64 y.o.   MRN: [id number]     PCP:  Dr. Hilma Favors   HPI: Seon Gaertner., is a 64 y.o. male who presents to the office today for a 6 month cardiology evaluation.   Mr. Perrier has a history of CML which was originally diagnosed in 2004, as well as a history of hypertension, hyperlipidemia, and type 2 diabetes mellitus. In 2010 he developed nitrate responsive chest pain which led to cardiac catheterization.  Catheterization revealed normal coronary arteries and normal LV function. In 2013 because of recurrent chest pain symptoms and echo Doppler study as well as Myoview scan were done. With exercise Myoview he exercised to a 9 minute workload. There was a question of borderline mild ischemia in the basal inferior wall. Ejection fraction is 54%. An echo Doppler study showed normal systolic and diastolic function. There was a question of pseudo-dyskinesis of the inferior wall due to increased infradiaphragmatic pressure.   He had mild aortic sclerosis.  When saw him in November 2017  he had noted some mild shortness of breath with significant activity.   Dr. Hilma Favors reduce his amlodipine dose and he now is on amlodipine at 5 mg, Bystolic 5 mg and ramipril 5 mg daily for hypertension, and atorvastatin 20 mg for hyperlipidemia.  He is diabetic on metformin and chest CT but flex, touch insulin.  His CML is in remission and he continues to be on Gleevec.    I  saw him in April 2019.  He admitted to mild weight gain and had not been exercising regularly.   He had laboratory in October 2018 by Dr. Hilma Favors which showed a cholesterol 123, HDL 43, LDL 64, triglycerides 79.  Recent hemoglobin A1c in March 2019 was 7.1. Renal function was mildly impaired at 1.49, but this was stable.  With his 15 pound weight gain, I discussed the American Heart Association recommendations of exercising at least 5 days/week for minimum of 30 minutes of moderate intensity.  I  last saw him in July 2020 which time he had remained stable from a cardiovascular perspective.  He specifically denied chest pain or shortness of breath.  His exercise has been limited due to bilateral knee discomfort and a bad back.  He has chronic low back pain, ruptured disc, and spinal stenosis.  He sees Dr. Lowanda Foster for his kidney disease and apparently underwent an ultrasound-guided renal core biopsy on May 18, 2019.  He was told that the results were normal..  Over the past 6 months, he has had issues with diabetes but controlled.  His hemoglobin A1c had increased to 11 but this has significantly improved 8.5 most recently in December 2020, followed by Dr. Dorris Fetch.  He denies chest pain or shortness of breath.  He is no longer on atorvastatin and had been started in place on rosuvastatin 20 mg for an LDL of 155.  He has not had recent laboratory.  He presents for evaluation.  Past Medical History:  Diagnosis Date  . Chronic back pain   . CML (chronic myelocytic leukemia) ([place])   . Diabetes mellitus without complication ([place])   . History of stress test    myoview study suggested the possibility of basal inferior wall ischemia which was present in all 3 tonographic views.  [name] Hx of echocardiogram 05/2012   EF 55%Mildly hypertrophic left ventricle with normal systolic function and early signs of mild diastolic dysfuntion. Moderately dilated left atrium, mild aortic valve  sclerosis with out stenosis.  . Hyperlipemia   . Hypertension   . Leukemia [facility])     Past Surgical History:  Procedure Laterality Date  . BACK SURGERY  12/2006  . CARDIAC CATHETERIZATION  2010   revealed mild nonobstructive CAD  . CARDIAC CATHETERIZATION  07/2012   This showed essentially normal coronary arteries without obstruction. there was normal LV function.  . COLONOSCOPY WITH PROPOFOL N/A 07/14/2018   Procedure: COLONOSCOPY WITH PROPOFOL;  Surgeon: Daneil Dolin, MD;  Location: [facility];  Service: Endoscopy;   Laterality: N/A;  10:45am  . KNEE SURGERY Left 01/1994  . LEFT HEART CATHETERIZATION WITH CORONARY ANGIOGRAM N/A 07/29/2012   Procedure: LEFT HEART CATHETERIZATION WITH CORONARY ANGIOGRAM;  Surgeon: Troy Sine, MD;  Location: [facility] CATH LAB;  Service: Cardiovascular;  Laterality: N/A;  . POLYPECTOMY  07/14/2018   Procedure: POLYPECTOMY;  Surgeon: Daneil Dolin, MD;  Location: [facility];  Service: Endoscopy;;  colon    No Known Allergies  Current Outpatient Medications  Medication Sig Dispense Refill  . Accu-Chek Softclix Lancets lancets USE TO TEST 3 TIMES DAILY. 100 each 5  . amLODipine (NORVASC) 10 MG tablet Take 10 mg by mouth daily.    [name] aspirin EC 81 MG tablet Take 81 mg by mouth daily.    [name] BYSTOLIC 10 MG tablet TAKE ONE TABLET BY MOUTH EVERY DAY. (Patient taking differently: Take 10 mg by mouth daily. ) 30 tablet 4  . Cholecalciferol (VITAMIN D3) 1000000 UNIT/GM LIQD by Does not apply route.    [name] glucose blood (ACCU-CHEK AVIVA PLUS) test strip Use as instructed, test 4 x a day 150 strip 5  . Insulin Degludec (TRESIBA FLEXTOUCH) 200 UNIT/ML SOPN Inject 100 Units into the skin at bedtime. 9 mL 3  . insulin lispro (HUMALOG) 100 UNIT/ML KwikPen Inject 0.18-0.24 mLs (18-24 Units total) into the skin 3 (three) times daily before meals. 30 mL 3  . Insulin Pen Needle (B-D ULTRAFINE III SHORT PEN) 31G X 8 MM MISC USING WITH INSULIN INJECTIONS 4X DAILY. 150 each 5  . Multiple Vitamin (MULTIVITAMIN WITH MINERALS) TABS Take 1 tablet by mouth daily.    [name] olmesartan (BENICAR) 40 MG tablet Take 40 mg by mouth daily.    . Oxycodone HCl 10 MG TABS Take 10 mg by mouth daily as needed (for pain.).     [name] rosuvastatin (CRESTOR) 20 MG tablet Take 20 mg by mouth daily.     No current facility-administered medications for this visit.    Social History   Socioeconomic History  . Marital status: Married    Spouse name: Not on file  . Number of children: Not on file  . Years of education: Not on  file  . Highest education level: Not on file  Occupational History  . Not on file  Tobacco Use  . Smoking status: Former Smoker    Packs/day: 0.25    Years: 15.00    Pack years: 3.75    Types: Cigarettes    Quit date: 07/08/1987    Years since quitting: 32.4  . Smokeless tobacco: Never Used  . Tobacco comment: "30 years ago"  Substance and Sexual Activity  . Alcohol use: Yes    Comment: "very seldom"  . Drug use: No  . Sexual activity: Yes    Birth control/protection: None  Other Topics Concern  . Not on file  Social History Narrative  . Not on file   Social Determinants of Health  Financial Resource Strain:   . Difficulty of Paying Living Expenses: Not on file  Food Insecurity:   . Worried About Charity fundraiser in the Last Year: Not on file  . Ran Out of Food in the Last Year: Not on file  Transportation Needs:   . Lack of Transportation (Medical): Not on file  . Lack of Transportation (Non-Medical): Not on file  Physical Activity:   . Days of Exercise per Week: Not on file  . Minutes of Exercise per Session: Not on file  Stress:   . Feeling of Stress : Not on file  Social Connections:   . Frequency of Communication with Friends and Family: Not on file  . Frequency of Social Gatherings with Friends and Family: Not on file  . Attends Religious Services: Not on file  . Active Member of Clubs or Organizations: Not on file  . Attends Archivist Meetings: Not on file  . Marital Status: Not on file  Intimate Partner Violence:   . Fear of Current or Ex-Partner: Not on file  . Emotionally Abused: Not on file  . Physically Abused: Not on file  . Sexually Abused: Not on file    Family History  Problem Relation Age of Onset  . Colon cancer Mother 94  . Cancer Father        Marena Chancy of what type; not colon CA   Social history is notable in that he is married. He has 2 children. There is no tobacco use. He quit at age 89. He does not routinely exercise. He  does note some fatigue. He rarely drinks alcohol.  ROS General: Negative; No fevers, chills, or night sweats;  HEENT: Negative; No changes in vision or hearing, sinus congestion, difficulty swallowing Pulmonary: Negative; No cough, wheezing, shortness of breath, hemoptysis Cardiovascular: Negative; No chest pain, presyncope, syncope, palpitations GI: Negative; No nausea, vomiting, diarrhea, or abdominal pain GU: Mild erectile dysfunction Musculoskeletal: Bilateral knee discomfort and low back pain Hematologic/Oncology: Positive for CML Endocrine: Positive for type 2 diabetes mellitus Neuro: Negative; no changes in balance, headaches Skin: Negative; No rashes or skin lesions Psychiatric: Negative; No behavioral problems, depression Sleep: Negative; No snoring, daytime sleepiness, hypersomnolence, bruxism, restless legs, hypnogognic hallucinations, no cataplexy Other comprehensive 14 point system review is negative.  PE BP (!) 144/84   Pulse 72   Temp (!) 97 F (36.1 C)   Ht _0  (1.854 m)   Wt 259 lb 9.6 oz (117.8 kg)   SpO2 98%   BMI 34.25 kg/m    Repeat blood pressure by me was 132/78  Wt Readings from Last 3 Encounters:  12/15/19 259 lb 9.6 oz (117.8 kg)  11/24/19 264 lb (119.7 kg)  06/17/19 252 lb (114.3 kg)   General: Alert, oriented, no distress.  Skin: normal turgor, no rashes, warm and dry HEENT: Normocephalic, atraumatic. Pupils equal round and reactive to light; sclera anicteric; extraocular muscles intact;  Nose without nasal septal hypertrophy Mouth/Parynx benign; Mallinpatti scale 2 Neck: No JVD, no carotid bruits; normal carotid upstroke Lungs: clear to ausculatation and percussion; no wheezing or rales Chest wall: without tenderness to palpitation Heart: PMI not displaced, RRR, s1 s2 normal, 1/6 systolic murmur, no diastolic murmur, no rubs, gallops, thrills, or heaves Abdomen: soft, nontender; no hepatosplenomehaly, BS+; abdominal aorta nontender and not  dilated by palpation. Back: no CVA tenderness Pulses 2+ Musculoskeletal: full range of motion, normal strength, no joint deformities Extremities: no clubbing cyanosis or edema, Homan's sign negative  Neurologic: grossly nonfocal; Cranial nerves grossly wnl Psychologic: Normal mood and affect   ECG (independently read by me): Normal sinus rhythm at 72 bpm.  LVH by voltage criteria.  ST abnormality in lead III, aVF, and V6.  July 2020 ECG (independently read by me): NSR at 62; LVH; T wave abnormality in III  April 2019 ECG (independently read by me): normal sinus rhythm at 68 bpm.  LVH by voltage.  November 2017 ECG (independently read by me): Normal sinus rhythm at 65 bpm.  LVH by voltage.  Normal intervals.  T-wave abnormality in lead 3.  November 2016 ECG (independently read by me): Normal sinus rhythm at 66.  LVH by voltage criteria.  October 2015 ECG (independently read by me): Sinus rhythm at 76 beats per minute.  LVH by voltage criteria in lead aVL  October 2014 ECG: Sinus rhythm. Moderate LVH by voltage criteria. Early repolarization in V2  LABS:  BMP Latest Ref Rng & Units 07/29/2019 04/21/2019 12/17/2018  Glucose 65 - 139 mg/dL 165(H) 159(H) 134  BUN 7 - 25 mg/dL _0 Creatinine 0.70 - 1.25 mg/dL 1.42(H) 1.41(H) 1.22  BUN/Creat Ratio 6 - 22 (calc) 17 11 NOT APPLICABLE  Sodium 496 - 146 mmol/L 138 139 139  Potassium 3.5 - 5.3 mmol/L 5.0 4.4 4.3  Chloride 98 - 110 mmol/L 106 102 105  CO2 20 - 32 mmol/L _1 Calcium 8.6 - 10.3 mg/dL 10.3 10.1 9.6   Hepatic Function Latest Ref Rng & Units 07/29/2019 04/21/2019 12/17/2018  Total Protein 6.1 - 8.1 g/dL 7.0 6.7 6.4  Albumin 3.8 - 4.8 g/dL - 4.1 -  AST 10 - 35 U/L _2 ALT 9 - 46 U/L _3 Alk Phosphatase 39 - 117 IU/L - 92 -  Total Bilirubin 0.2 - 1.2 mg/dL 0.4 0.3 0.4   CBC Latest Ref Rng & Units 05/18/2019 07/07/2018 05/02/2009  WBC 4.0 - 10.5 K/uL 11.1(H) 8.4 7.3  Hemoglobin 13.0 - 17.0 g/dL 14.9 12.9(L) 13.3   Hematocrit 39.0 - 52.0 % 44.8 36.9(L) 39.1  Platelets 150 - 400 K/uL 296 319 236   Lab Results  Component Value Date   MCV 85.5 05/18/2019   MCV 88.3 07/07/2018   MCV 92.1 05/02/2009            05/01/2009   Lab Results  Component Value Date   HGBA1C 8.5 (A) 11/24/2019  Lipid Panel  No results found for: CHOL, TRIG, HDL, CHOLHDL, VLDL, LDLCALC, LDLDIRECT   RADIOLOGY: No results found.  IMPRESSION: 1. Essential hypertension   2. Hyperlipidemia with target LDL less than 70   3. Mixed hyperlipidemia   4. Renal insufficiency   5. Mild obesity   6. Left ventricular hypertrophy   7. CML (chronic myelocytic leukemia) ([place])      ASSESSMENT AND PLAN: Mr. Yovanny Coats is a 64 year old African-American gentleman gentleman who has a history of hypertension with documented mild left ventricular hypertrophy on echocardiography and also by ECG criteria.   He has a history of renal insufficiency which has been stable as well as CML, which has been in remission.  He recently underwent an ultrasound guided renal core biopsy to further evaluate his renal insufficiency and proteinuria ordered by by Dr. Lowanda Foster and was told that this was okay.  He is now on a blood pressure regimen consisting of amlodipine 10 mg, Bystolic 10 mg in addition to olmesartan 40 mg.  Repeat blood pressure by me  was 132/78.  He is not having any anginal symptoms.  He recently had developed poorly controlled diabetes with increased hemoglobin A1c up to 11.  He is on insulin followed by Dr. Dorris Fetch with recent improvement of hemoglobin A1c to 8.5, but still significantly elevated.  Recent LDL cholesterol in September 2020 had increased 155 with total cholesterol 226 and triglycerides 167.  He is no longer on atorvastatin but is now on rosuvastatin 20 mg.  I have recommended follow-up chemistry profile and lipid panel.  BMI is 34.25 consistent with obesity.  I discussed the importance of weight loss and increased exercise  particularly if possible 5 days/week for at least 30 minutes.  His CML has been stable.  We will see him in 6 months for reevaluation or sooner as needed.   Time spent: 25 minutes Troy Sine, MD, [facility]  12/17/2019 6:29 PM

## 2019-12-15 NOTE — Patient Instructions (Signed)
Medication Instructions:  Continue current medications  *If you need a refill on your cardiac medications before your next appointment, please call your pharmacy*  Lab Work: CMP and Fasting Lipids  If you have labs (blood work) drawn today and your tests are completely normal, you will receive your results only by: [name] MyChart Message (if you have MyChart) OR . A paper copy in the mail If you have any lab test that is abnormal or we need to change your treatment, we will call you to review the results.  Testing/Procedures: None Ordered  Follow-Up: At [facility], you and your health needs are our priority.  As part of our continuing mission to provide you with exceptional heart care, we have created designated Provider Care Teams.  These Care Teams include your primary Cardiologist (physician) and Advanced Practice Providers (APPs -  Physician Assistants and Nurse Practitioners) who all work together to provide you with the care you need, when you need it.  Your next appointment:   6 month(s)  The format for your next appointment:   In Person  Provider:   Shelva Majestic, MD

## 2019-12-17 ENCOUNTER — Encounter: Payer: Self-pay | Admitting: Cardiovascular Disease

## 2019-12-17 DIAGNOSIS — I10 Essential (primary) hypertension: Secondary | ICD-10-CM | POA: Diagnosis not present

## 2019-12-17 DIAGNOSIS — E785 Hyperlipidemia, unspecified: Secondary | ICD-10-CM | POA: Diagnosis not present

## 2019-12-18 LAB — COMPREHENSIVE METABOLIC PANEL
ALT: 21 IU/L (ref 0–44)
AST: 21 IU/L (ref 0–40)
Albumin/Globulin Ratio: 1.5 (ref 1.2–2.2)
Albumin: 4 g/dL (ref 3.8–4.8)
Alkaline Phosphatase: 84 IU/L (ref 39–117)
BUN/Creatinine Ratio: 14 (ref 10–24)
BUN: 21 mg/dL (ref 8–27)
Bilirubin Total: 0.3 mg/dL (ref 0.0–1.2)
CO2: 20 mmol/L (ref 20–29)
Calcium: 9.9 mg/dL (ref 8.6–10.2)
Chloride: 102 mmol/L (ref 96–106)
Creatinine, Ser: 1.48 mg/dL — ABNORMAL HIGH (ref 0.76–1.27)
GFR calc Af Amer: 57 mL/min/{1.73_m2} — ABNORMAL LOW (ref >59)
GFR calc non Af Amer: 49 mL/min/{1.73_m2} — ABNORMAL LOW (ref >59)
Globulin, Total: 2.7 g/dL (ref 1.5–4.5)
Glucose: 95 mg/dL (ref 65–99)
Potassium: 4.2 mmol/L (ref 3.5–5.2)
Sodium: 139 mmol/L (ref 134–144)
Total Protein: 6.7 g/dL (ref 6.0–8.5)

## 2019-12-18 LAB — LIPID PANEL
Chol/HDL Ratio: 3.7 ratio (ref 0.0–5.0)
Cholesterol, Total: 150 mg/dL (ref 100–199)
HDL: 41 mg/dL (ref >39)
LDL Chol Calc (NIH): 85 mg/dL (ref 0–99)
Triglycerides: 137 mg/dL (ref 0–149)
VLDL Cholesterol Cal: 24 mg/dL (ref 5–40)

## 2019-12-22 ENCOUNTER — Other Ambulatory Visit: Payer: Self-pay

## 2019-12-22 MED ORDER — ROSUVASTATIN CALCIUM 40 MG PO TABS: 40.0000 mg | ORAL_TABLET | Freq: Every day | ORAL | 3 refills | Status: DC

## 2019-12-27 DIAGNOSIS — E1122 Type 2 diabetes mellitus with diabetic chronic kidney disease: Secondary | ICD-10-CM | POA: Diagnosis not present

## 2019-12-27 DIAGNOSIS — I129 Hypertensive chronic kidney disease with stage 1 through stage 4 chronic kidney disease, or unspecified chronic kidney disease: Secondary | ICD-10-CM | POA: Diagnosis not present

## 2019-12-27 DIAGNOSIS — N1832 Chronic kidney disease, stage 3b: Secondary | ICD-10-CM | POA: Diagnosis not present

## 2020-01-07 ENCOUNTER — Other Ambulatory Visit: Payer: Self-pay | Admitting: "Endocrinology

## 2020-01-10 ENCOUNTER — Ambulatory Visit: Payer: Medicare Other

## 2020-01-13 DIAGNOSIS — C9211 Chronic myeloid leukemia, BCR/ABL-positive, in remission: Secondary | ICD-10-CM | POA: Diagnosis not present

## 2020-01-14 DIAGNOSIS — C9211 Chronic myeloid leukemia, BCR/ABL-positive, in remission: Secondary | ICD-10-CM | POA: Diagnosis not present

## 2020-01-27 ENCOUNTER — Other Ambulatory Visit: Payer: Self-pay | Admitting: "Endocrinology

## 2020-01-28 ENCOUNTER — Ambulatory Visit: Payer: Medicare Other | Attending: Internal Medicine

## 2020-01-28 DIAGNOSIS — Z23 Encounter for immunization: Secondary | ICD-10-CM | POA: Insufficient documentation

## 2020-01-28 NOTE — Progress Notes (Signed)
   [id number]  Name:  Dylan Spears.    MRN: [id number] DOB: [birth year]  01/28/2020  [name] was observed post Covid-19 immunization for 15 minutes without incident. He was provided with Vaccine Information Sheet and instruction to access the V-Safe system.   [name] was instructed to call 911 with any severe reactions post vaccine: [name] Difficulty breathing  . Swelling of face and throat  . A fast heartbeat  . A bad rash all over body  . Dizziness and weakness   Immunizations Administered    Name Date Dose VIS Date Route   Moderna COVID-19 Vaccine 01/28/2020  9:06 AM 0.5 mL 10/27/2019 Intramuscular   Manufacturer: Moderna   Lot: [id number]   [id number]

## 2020-02-02 ENCOUNTER — Other Ambulatory Visit: Payer: Self-pay | Admitting: "Endocrinology

## 2020-02-08 DIAGNOSIS — G894 Chronic pain syndrome: Secondary | ICD-10-CM | POA: Diagnosis not present

## 2020-02-22 ENCOUNTER — Other Ambulatory Visit: Payer: Self-pay | Admitting: "Endocrinology

## 2020-03-02 ENCOUNTER — Ambulatory Visit: Payer: Medicare Other | Attending: Internal Medicine

## 2020-03-02 DIAGNOSIS — Z23 Encounter for immunization: Secondary | ICD-10-CM

## 2020-03-02 NOTE — Progress Notes (Signed)
   [id number] [facility]  Name:  Hester Sunshine.    MRN: [id number] DOB: 1956/02/10  03/02/2020  Mr. Conto was observed post Covid-19 immunization for 15 minutes without incident. He was provided with Vaccine Information Sheet and instruction to access the V-Safe system.   Mr. Maggs was instructed to call 911 with any severe reactions post vaccine: [name] Difficulty breathing  . Swelling of face and throat  . A fast heartbeat  . A bad rash all over body  . Dizziness and weakness   Immunizations Administered    Name Date Dose VIS Date Route   Moderna COVID-19 Vaccine 03/02/2020  8:31 AM 0.5 mL 10/27/2019 Intramuscular   Manufacturer: Levan Hurst   [id number]   [place]:[id number]

## 2020-03-10 DIAGNOSIS — N182 Chronic kidney disease, stage 2 (mild): Secondary | ICD-10-CM | POA: Diagnosis not present

## 2020-03-10 DIAGNOSIS — M2550 Pain in unspecified joint: Secondary | ICD-10-CM | POA: Diagnosis not present

## 2020-03-10 DIAGNOSIS — C9211 Chronic myeloid leukemia, BCR/ABL-positive, in remission: Secondary | ICD-10-CM | POA: Diagnosis not present

## 2020-03-10 DIAGNOSIS — G8929 Other chronic pain: Secondary | ICD-10-CM | POA: Diagnosis not present

## 2020-03-10 DIAGNOSIS — Z79899 Other long term (current) drug therapy: Secondary | ICD-10-CM | POA: Diagnosis not present

## 2020-03-10 DIAGNOSIS — M1990 Unspecified osteoarthritis, unspecified site: Secondary | ICD-10-CM | POA: Diagnosis not present

## 2020-03-10 DIAGNOSIS — M549 Dorsalgia, unspecified: Secondary | ICD-10-CM | POA: Diagnosis not present

## 2020-03-11 DIAGNOSIS — E211 Secondary hyperparathyroidism, not elsewhere classified: Secondary | ICD-10-CM | POA: Diagnosis not present

## 2020-03-11 DIAGNOSIS — N189 Chronic kidney disease, unspecified: Secondary | ICD-10-CM | POA: Diagnosis not present

## 2020-03-11 DIAGNOSIS — R809 Proteinuria, unspecified: Secondary | ICD-10-CM | POA: Diagnosis not present

## 2020-03-11 DIAGNOSIS — E1122 Type 2 diabetes mellitus with diabetic chronic kidney disease: Secondary | ICD-10-CM | POA: Diagnosis not present

## 2020-03-11 DIAGNOSIS — E1129 Type 2 diabetes mellitus with other diabetic kidney complication: Secondary | ICD-10-CM | POA: Diagnosis not present

## 2020-03-15 DIAGNOSIS — C9211 Chronic myeloid leukemia, BCR/ABL-positive, in remission: Secondary | ICD-10-CM | POA: Diagnosis not present

## 2020-03-16 DIAGNOSIS — M7752 Other enthesopathy of left foot: Secondary | ICD-10-CM | POA: Diagnosis not present

## 2020-03-16 DIAGNOSIS — Z856 Personal history of leukemia: Secondary | ICD-10-CM | POA: Diagnosis not present

## 2020-03-16 DIAGNOSIS — M7022 Olecranon bursitis, left elbow: Secondary | ICD-10-CM | POA: Diagnosis not present

## 2020-03-16 DIAGNOSIS — Z87891 Personal history of nicotine dependence: Secondary | ICD-10-CM | POA: Diagnosis not present

## 2020-03-16 DIAGNOSIS — M1811 Unilateral primary osteoarthritis of first carpometacarpal joint, right hand: Secondary | ICD-10-CM | POA: Diagnosis not present

## 2020-03-16 DIAGNOSIS — M2550 Pain in unspecified joint: Secondary | ICD-10-CM | POA: Diagnosis not present

## 2020-03-16 DIAGNOSIS — Z9221 Personal history of antineoplastic chemotherapy: Secondary | ICD-10-CM | POA: Diagnosis not present

## 2020-03-16 DIAGNOSIS — M19042 Primary osteoarthritis, left hand: Secondary | ICD-10-CM | POA: Diagnosis not present

## 2020-03-16 DIAGNOSIS — M1812 Unilateral primary osteoarthritis of first carpometacarpal joint, left hand: Secondary | ICD-10-CM | POA: Diagnosis not present

## 2020-03-16 DIAGNOSIS — M8949 Other hypertrophic osteoarthropathy, multiple sites: Secondary | ICD-10-CM | POA: Diagnosis not present

## 2020-03-16 DIAGNOSIS — M2141 Flat foot [pes planus] (acquired), right foot: Secondary | ICD-10-CM | POA: Diagnosis not present

## 2020-03-16 DIAGNOSIS — M7021 Olecranon bursitis, right elbow: Secondary | ICD-10-CM | POA: Diagnosis not present

## 2020-03-16 DIAGNOSIS — M19032 Primary osteoarthritis, left wrist: Secondary | ICD-10-CM | POA: Diagnosis not present

## 2020-03-16 DIAGNOSIS — M1990 Unspecified osteoarthritis, unspecified site: Secondary | ICD-10-CM | POA: Diagnosis not present

## 2020-03-16 DIAGNOSIS — M19072 Primary osteoarthritis, left ankle and foot: Secondary | ICD-10-CM | POA: Diagnosis not present

## 2020-03-16 DIAGNOSIS — M19071 Primary osteoarthritis, right ankle and foot: Secondary | ICD-10-CM | POA: Diagnosis not present

## 2020-03-16 DIAGNOSIS — G8929 Other chronic pain: Secondary | ICD-10-CM | POA: Diagnosis not present

## 2020-03-16 DIAGNOSIS — M19031 Primary osteoarthritis, right wrist: Secondary | ICD-10-CM | POA: Diagnosis not present

## 2020-03-16 DIAGNOSIS — M19041 Primary osteoarthritis, right hand: Secondary | ICD-10-CM | POA: Diagnosis not present

## 2020-03-16 DIAGNOSIS — M2142 Flat foot [pes planus] (acquired), left foot: Secondary | ICD-10-CM | POA: Diagnosis not present

## 2020-03-16 DIAGNOSIS — M5440 Lumbago with sciatica, unspecified side: Secondary | ICD-10-CM | POA: Diagnosis not present

## 2020-03-16 DIAGNOSIS — M180 Bilateral primary osteoarthritis of first carpometacarpal joints: Secondary | ICD-10-CM | POA: Diagnosis not present

## 2020-03-18 DIAGNOSIS — N183 Chronic kidney disease, stage 3 unspecified: Secondary | ICD-10-CM | POA: Diagnosis not present

## 2020-03-18 DIAGNOSIS — E559 Vitamin D deficiency, unspecified: Secondary | ICD-10-CM | POA: Diagnosis not present

## 2020-03-18 DIAGNOSIS — E1165 Type 2 diabetes mellitus with hyperglycemia: Secondary | ICD-10-CM | POA: Diagnosis not present

## 2020-03-18 DIAGNOSIS — Z794 Long term (current) use of insulin: Secondary | ICD-10-CM | POA: Diagnosis not present

## 2020-03-18 DIAGNOSIS — I129 Hypertensive chronic kidney disease with stage 1 through stage 4 chronic kidney disease, or unspecified chronic kidney disease: Secondary | ICD-10-CM | POA: Diagnosis not present

## 2020-03-18 DIAGNOSIS — E211 Secondary hyperparathyroidism, not elsewhere classified: Secondary | ICD-10-CM | POA: Diagnosis not present

## 2020-03-18 DIAGNOSIS — E782 Mixed hyperlipidemia: Secondary | ICD-10-CM | POA: Diagnosis not present

## 2020-03-18 DIAGNOSIS — N189 Chronic kidney disease, unspecified: Secondary | ICD-10-CM | POA: Diagnosis not present

## 2020-03-18 DIAGNOSIS — R809 Proteinuria, unspecified: Secondary | ICD-10-CM | POA: Diagnosis not present

## 2020-03-18 DIAGNOSIS — E1122 Type 2 diabetes mellitus with diabetic chronic kidney disease: Secondary | ICD-10-CM | POA: Diagnosis not present

## 2020-03-19 ENCOUNTER — Other Ambulatory Visit: Payer: Self-pay | Admitting: "Endocrinology

## 2020-03-19 LAB — COMPLETE METABOLIC PANEL WITH GFR
AG Ratio: 1.6 (calc) (ref 1.0–2.5)
ALT: 24 U/L (ref 9–46)
AST: 18 U/L (ref 10–35)
Albumin: 4.2 g/dL (ref 3.6–5.1)
Alkaline phosphatase (APISO): 73 U/L (ref 35–144)
BUN/Creatinine Ratio: 17 (calc) (ref 6–22)
BUN: 24 mg/dL (ref 7–25)
CO2: 26 mmol/L (ref 20–32)
Calcium: 9.6 mg/dL (ref 8.6–10.3)
Chloride: 104 mmol/L (ref 98–110)
Creat: 1.45 mg/dL — ABNORMAL HIGH (ref 0.70–1.25)
GFR, Est African American: 59 mL/min/{1.73_m2} — ABNORMAL LOW (ref >=60)
GFR, Est Non African American: 51 mL/min/{1.73_m2} — ABNORMAL LOW (ref >=60)
Globulin: 2.6 g/dL (calc) (ref 1.9–3.7)
Glucose, Bld: 220 mg/dL — ABNORMAL HIGH (ref 65–99)
Potassium: 4.6 mmol/L (ref 3.5–5.3)
Sodium: 138 mmol/L (ref 135–146)
Total Bilirubin: 0.3 mg/dL (ref 0.2–1.2)
Total Protein: 6.8 g/dL (ref 6.1–8.1)

## 2020-03-19 LAB — LIPID PANEL
Cholesterol: 136 mg/dL (ref <200)
HDL: 39 mg/dL — ABNORMAL LOW (ref >=40)
LDL Cholesterol (Calc): 78 mg/dL (calc)
Non-HDL Cholesterol (Calc): 97 mg/dL (calc) (ref <130)
Total CHOL/HDL Ratio: 3.5 (calc) (ref <5.0)
Triglycerides: 111 mg/dL (ref <150)

## 2020-03-19 LAB — T4, FREE: Free T4: 1.2 ng/dL (ref 0.8–1.8)

## 2020-03-19 LAB — VITAMIN D 25 HYDROXY (VIT D DEFICIENCY, FRACTURES): Vit D, 25-Hydroxy: 32 ng/mL (ref 30–100)

## 2020-03-19 LAB — TSH: TSH: 2.38 mIU/L (ref 0.40–4.50)

## 2020-03-24 ENCOUNTER — Encounter: Payer: Self-pay | Admitting: "Endocrinology

## 2020-03-24 ENCOUNTER — Other Ambulatory Visit: Payer: Self-pay

## 2020-03-24 ENCOUNTER — Ambulatory Visit (INDEPENDENT_AMBULATORY_CARE_PROVIDER_SITE_OTHER): Payer: Medicare Other | Admitting: "Endocrinology

## 2020-03-24 VITALS — BP 117/73 | HR 71 | Ht 73.0 in | Wt 261.1 lb

## 2020-03-24 DIAGNOSIS — Z794 Long term (current) use of insulin: Secondary | ICD-10-CM | POA: Diagnosis not present

## 2020-03-24 DIAGNOSIS — I10 Essential (primary) hypertension: Secondary | ICD-10-CM

## 2020-03-24 DIAGNOSIS — E1122 Type 2 diabetes mellitus with diabetic chronic kidney disease: Secondary | ICD-10-CM | POA: Diagnosis not present

## 2020-03-24 DIAGNOSIS — E782 Mixed hyperlipidemia: Secondary | ICD-10-CM | POA: Diagnosis not present

## 2020-03-24 DIAGNOSIS — E1165 Type 2 diabetes mellitus with hyperglycemia: Secondary | ICD-10-CM

## 2020-03-24 DIAGNOSIS — N183 Chronic kidney disease, stage 3 unspecified: Secondary | ICD-10-CM | POA: Diagnosis not present

## 2020-03-24 DIAGNOSIS — IMO0002 Reserved for concepts with insufficient information to code with codable children: Secondary | ICD-10-CM

## 2020-03-24 LAB — POCT GLYCOSYLATED HEMOGLOBIN (HGB A1C): Hemoglobin A1C: 9.2 % — AB (ref 4.0–5.6)

## 2020-03-24 MED ORDER — TRESIBA FLEXTOUCH 200 UNIT/ML ~~LOC~~ SOPN: 110.0000 [IU] | PEN_INJECTOR | Freq: Every day | SUBCUTANEOUS | 2 refills | Status: DC

## 2020-03-24 MED ORDER — INSULIN LISPRO (1 UNIT DIAL) 100 UNIT/ML (KWIKPEN): 20.0000 [IU] | PEN_INJECTOR | Freq: Three times a day (TID) | SUBCUTANEOUS | 2 refills | Status: DC

## 2020-03-24 MED ORDER — FREESTYLE LIBRE 14 DAY SENSOR MISC: 1.0000 | 2 refills | Status: DC

## 2020-03-24 MED ORDER — FREESTYLE LIBRE 14 DAY READER DEVI: 1.0000 | Freq: Once | 0 refills | Status: AC

## 2020-03-24 NOTE — Progress Notes (Signed)
03/24/2020                Endocrinology follow-up note  Subjective:    Patient ID: Dylan Fryling., male    DOB: Jun 13, 1956. Patient is being seen in follow-up  for management of currently uncontrolled, complicated type 2 diabetes requested by  Sharilyn Sites, MD  Past Medical History:  Diagnosis Date  . Chronic back pain   . CML (chronic myelocytic leukemia) ([place])   . Diabetes mellitus without complication ([place])   . History of stress test    myoview study suggested the possibility of basal inferior wall ischemia which was present in all 3 tonographic views.  [name] Hx of echocardiogram 05/2012   EF 55%Mildly hypertrophic left ventricle with normal systolic function and early signs of mild diastolic dysfuntion. Moderately dilated left atrium, mild aortic valve sclerosis with out stenosis.  . Hyperlipemia   . Hypertension   . Leukemia [facility])    Past Surgical History:  Procedure Laterality Date  . BACK SURGERY  12/2006  . CARDIAC CATHETERIZATION  2010   revealed mild nonobstructive CAD  . CARDIAC CATHETERIZATION  07/2012   This showed essentially normal coronary arteries without obstruction. there was normal LV function.  . COLONOSCOPY WITH PROPOFOL N/A 07/14/2018   Procedure: COLONOSCOPY WITH PROPOFOL;  Surgeon: Daneil Dolin, MD;  Location: [facility];  Service: Endoscopy;  Laterality: N/A;  10:45am  . KNEE SURGERY Left 01/1994  . LEFT HEART CATHETERIZATION WITH CORONARY ANGIOGRAM N/A 07/29/2012   Procedure: LEFT HEART CATHETERIZATION WITH CORONARY ANGIOGRAM;  Surgeon: Troy Sine, MD;  Location: [facility] CATH LAB;  Service: Cardiovascular;  Laterality: N/A;  . POLYPECTOMY  07/14/2018   Procedure: POLYPECTOMY;  Surgeon: Daneil Dolin, MD;  Location: [facility];  Service: Endoscopy;;  colon   Social History   Socioeconomic History  . Marital status: Married    Spouse name: Not on file  . Number of children: Not on file  . Years of education: Not on file  . Highest education  level: Not on file  Occupational History  . Not on file  Tobacco Use  . Smoking status: Former Smoker    Packs/day: 0.25    Years: 15.00    Pack years: 3.75    Types: Cigarettes    Quit date: 07/08/1987    Years since quitting: 32.7  . Smokeless tobacco: Never Used  . Tobacco comment: "30 years ago"  Substance and Sexual Activity  . Alcohol use: Yes    Comment: "very seldom"  . Drug use: No  . Sexual activity: Yes    Birth control/protection: None  Other Topics Concern  . Not on file  Social History Narrative  . Not on file   Social Determinants of Health   Financial Resource Strain:   . Difficulty of Paying Living Expenses:   Food Insecurity:   . Worried About Charity fundraiser in the Last Year:   . Arboriculturist in the Last Year:   Transportation Needs:   . Film/video editor (Medical):   [name] Lack of Transportation (Non-Medical):   Physical Activity:   . Days of Exercise per Week:   . Minutes of Exercise per Session:   Stress:   . Feeling of Stress :   Social Connections:   . Frequency of Communication with Friends and Family:   . Frequency of Social Gatherings with Friends and Family:   . Attends Religious Services:   . Active Member of  Clubs or Organizations:   . Attends Archivist Meetings:   [name] Marital Status:    Outpatient Encounter Medications as of 03/24/2020  Medication Sig  . Accu-Chek Softclix Lancets lancets Use as directed to check blood glucose four times daily.  [name] amLODipine (NORVASC) 10 MG tablet Take 10 mg by mouth daily.  [name] aspirin EC 81 MG tablet Take 81 mg by mouth daily.  [name] BYSTOLIC 10 MG tablet TAKE ONE TABLET BY MOUTH EVERY DAY. (Patient taking differently: Take 10 mg by mouth daily. )  . Cholecalciferol (VITAMIN D3) 1000000 UNIT/GM LIQD by Does not apply route.  . Continuous Blood Gluc Receiver (FREESTYLE LIBRE 14 DAY READER) DEVI 1 each by Does not apply route once for 1 dose.  . Continuous Blood Gluc Sensor (FREESTYLE  LIBRE 14 DAY SENSOR) MISC Inject 1 each into the skin every 14 (fourteen) days. Use as directed.  [name] glucose blood (ACCU-CHEK AVIVA PLUS) test strip USE TO TEST 4 TIMES DAILY.  [name] insulin degludec (TRESIBA FLEXTOUCH) 200 UNIT/ML FlexTouch Pen Inject 110 Units into the skin at bedtime.  . insulin lispro (HUMALOG) 100 UNIT/ML KwikPen Inject 0.2-0.26 mLs (20-26 Units total) into the skin 3 (three) times daily before meals.  . Insulin Pen Needle (B-D ULTRAFINE III SHORT PEN) 31G X 8 MM MISC USING WITH INSULIN INJECTIONS 4X DAILY.  . Multiple Vitamin (MULTIVITAMIN WITH MINERALS) TABS Take 1 tablet by mouth daily.  [name] olmesartan (BENICAR) 40 MG tablet Take 40 mg by mouth daily.  . Oxycodone HCl 10 MG TABS Take 10 mg by mouth daily as needed (for pain.).   [name] rosuvastatin (CRESTOR) 40 MG tablet Take 1 tablet (40 mg total) by mouth daily.  . sodium bicarbonate 650 MG tablet Take 650 mg by mouth 3 (three) times daily.  . [DISCONTINUED] insulin lispro (HUMALOG) 100 UNIT/ML KwikPen INJECT 18-24 UNITS INTO THE SKIN 3 TIMES DAILY BEFORE MEALS.  . [DISCONTINUED] TRESIBA FLEXTOUCH 200 UNIT/ML FlexTouch Pen INJECT 100 UNITS INTO THE SKIN AT BEDTIME.   No facility-administered encounter medications on file as of 03/24/2020.   ALLERGIES: No Known Allergies VACCINATION STATUS: Immunization History  Administered Date(s) Administered  . Moderna SARS-COVID-2 Vaccination 01/28/2020, 03/02/2020    Diabetes He presents for his follow-up diabetic visit. He has type 2 diabetes mellitus. Onset time: He was diagnosed at approximate age of 16 years. His disease course has been worsening. There are no hypoglycemic associated symptoms. Pertinent negatives for hypoglycemia include no confusion, headaches, pallor or seizures. Pertinent negatives for diabetes include no chest pain, no fatigue, no polydipsia, no polyphagia, no polyuria and no weakness. There are no hypoglycemic complications. Symptoms are improving. Diabetic  complications include nephropathy. Risk factors for coronary artery disease include diabetes mellitus, dyslipidemia, hypertension, male sex, sedentary lifestyle and tobacco exposure. Current diabetic treatment includes oral agent (dual therapy) and insulin injections. His weight is increasing steadily. He is following a generally unhealthy diet. When asked about meal planning, he reported none. He has not had a previous visit with a dietitian. He never participates in exercise. His home blood glucose trend is decreasing steadily. His breakfast blood glucose range is generally 180-200 mg/dl. His lunch blood glucose range is generally >200 mg/dl. His dinner blood glucose range is generally >200 mg/dl. His bedtime blood glucose range is generally >200 mg/dl. His overall blood glucose range is >200 mg/dl. An ACE inhibitor/angiotensin II receptor blocker is being taken. Eye exam is current.  Hyperlipidemia This is a chronic problem. The current episode  started more than 1 year ago. Exacerbating diseases include diabetes and obesity. Pertinent negatives include no chest pain, myalgias or shortness of breath. Current antihyperlipidemic treatment includes statins. Risk factors for coronary artery disease include dyslipidemia, hypertension, male sex, a sedentary lifestyle and obesity.  Hypertension This is a chronic problem. The current episode started more than 1 year ago. The problem is uncontrolled. Pertinent negatives include no chest pain, headaches, neck pain, palpitations or shortness of breath. Risk factors for coronary artery disease include dyslipidemia, diabetes mellitus, male gender, obesity and sedentary lifestyle. Past treatments include ACE inhibitors. Hypertensive end-organ damage includes kidney disease.    Review of systems  Constitutional: + Minimally fluctuating body weight,  current  Body mass index is 34.45 kg/m. , no fatigue, no subjective hyperthermia, no subjective hypothermia Eyes: no  blurry vision, no xerophthalmia ENT: no sore throat, no nodules palpated in throat, no dysphagia/odynophagia, no hoarseness Cardiovascular: no Chest Pain, no Shortness of Breath, no palpitations, no leg swelling Respiratory: no cough, no shortness of breath Gastrointestinal: no Nausea/Vomiting/Diarhhea Musculoskeletal: no muscle/joint aches Skin: no rashes, no hyperemia Neurological: no tremors, no numbness, no tingling, no dizziness Psychiatric: no depression, no anxiety   Objective:    BP 117/73   Pulse 71   Ht 6\' 1"  (1.854 m)   Wt 261 lb 1.6 oz (118.4 kg)   BMI 34.45 kg/m   Wt Readings from Last 3 Encounters:  03/24/20 261 lb 1.6 oz (118.4 kg)  12/15/19 259 lb 9.6 oz (117.8 kg)  11/24/19 264 lb (119.7 kg)     Physical Exam- Limited  Constitutional:  Body mass index is 34.45 kg/m. , not in acute distress, normal state of mind Eyes:  EOMI, no exophthalmos Neck: Supple Thyroid: No gross goiter Respiratory: Adequate breathing efforts Musculoskeletal: no gross deformities, strength intact in all four extremities, no gross restriction of joint movements , + foot exam is normal today. Skin:  no rashes, no hyperemia Neurological: no tremor with outstretched hands,    CMP     Component Value Date/Time   NA 138 03/18/2020 0803   NA 139 12/17/2019 0917   K 4.6 03/18/2020 0803   CL 104 03/18/2020 0803   CO2 26 03/18/2020 0803   GLUCOSE 220 (H) 03/18/2020 0803   BUN 24 03/18/2020 0803   BUN 21 12/17/2019 0917   CREATININE 1.45 (H) 03/18/2020 0803   CALCIUM 9.6 03/18/2020 0803   PROT 6.8 03/18/2020 0803   PROT 6.7 12/17/2019 0917   ALBUMIN 4.0 12/17/2019 0917   AST 18 03/18/2020 0803   ALT 24 03/18/2020 0803   ALKPHOS 84 12/17/2019 0917   BILITOT 0.3 03/18/2020 0803   BILITOT 0.3 12/17/2019 0917   GFRNONAA 51 (L) 03/18/2020 0803   GFRAA 59 (L) 03/18/2020 0803    Diabetic Labs (most recent): Lab Results  Component Value Date   HGBA1C 9.2 (A) 03/24/2020   HGBA1C  8.5 (A) 11/24/2019   HGBA1C 11.1 (H) 07/29/2019     Assessment & Plan:   1. Uncontrolled type 2 diabetes mellitus with worsening chronic kidney disease.  - Patient has currently uncontrolled symptomatic type 2 DM since  64 years of age. - He presents with significantly above target glycemic profile and A1c of 9.2% increasing from 8.5%.    He continues to overcorrect for glycemic readings below 80s. - Recent labs reviewed.   His diabetes is complicated by stage 2-3 CK D and patient remains at a high risk for more acute and chronic complications of  diabetes which include CAD, CVA, CKD, retinopathy, and neuropathy. These are all discussed in detail with the patient.  - I have counseled the patient on diet management and weight loss, by adopting a carbohydrate restricted/protein rich diet.  - he  admits there is a room for improvement in his diet and drink choices. -  Suggestion is made for him to avoid simple carbohydrates  from his diet including Cakes, Sweet Desserts / Pastries, Ice Cream, Soda (diet and regular), Sweet Tea, Candies, Chips, Cookies, Sweet Pastries,  Store Bought Juices, Alcohol in Excess of  1-2 drinks a day, Artificial Sweeteners, Coffee Creamer, and "Sugar-free" Products. This will help patient to have stable blood glucose profile and potentially avoid unintended weight gain.  - I encouraged the patient to switch to  unprocessed or minimally processed complex starch and increased protein intake (animal or plant source), fruits, and vegetables.  - Patient is advised to stick to a routine mealtimes to eat 3 meals  a day and avoid unnecessary snacks ( to snack only to correct hypoglycemia).   - I have approached patient with the following individualized plan to manage diabetes and patient agrees:   -He will continue to need intensive treatment with higher dose of Basal /Bolus insulin  in order for him to achieve and maintain control of diabetes to target.    --He is  advised to increase Tresiba to 110 units nightly, increase Humalog to 20 units 3 times daily AC  for pre-meal blood glucose readings greater than 90 mg per DL, associated with strict monitoring of blood glucose 4 times a day-before meals and at bedtime.   -Patient is encouraged to call clinic for blood glucose levels less than 70 or above 200 mg /dl.  -He will benefit from a CGM .  I discussed and prescribed the freestyle libre device for him.  -He is not a candidate for Metformin, SGLT2 inhibitors for now.   -His renal function is stabilizing at GFR 59, advised to continue to avoid any over-the-counter NSAIDs and avoid dehydration.     - Patient specific target  A1c;  LDL, HDL, Triglycerides, and  Waist Circumference were discussed in detail.  2) BP/HTN:  His blood pressure is controlled to target.   He is advised to continue his current blood pressure medications including amlodipine 10 mg p.o. daily, ramipril 5 mg p.o. daily, Bystolic 5 mg p.o. daily.     3) Lipids/HPL: His recent lipid panel showed controlled LDL at 78.  He is advised to continue atorvastatin 20 mg p.o. nightly.  4)  Weight/Diet: His BMI 34.4.  He is a candidate for modest weight loss.  CDE Consult has been initiated , exercise, and detailed carbohydrates information provided.  5) Chronic Care/Health Maintenance:  -Patient is on ACEI/ARB and Statin medications and encouraged to continue to follow up with Ophthalmology, Podiatrist at least yearly or according to recommendations, and advised to   stay away from smoking. I have recommended yearly flu vaccine and pneumonia vaccination at least every 5 years; moderate intensity exercise for up to 150 minutes weekly; and  sleep for at least 7 hours a day.  - I advised patient to maintain close follow up with Sharilyn Sites, MD for primary care needs.  - Time spent on this patient care encounter:  35 min, of which > 50% was spent in  counseling and the rest reviewing his blood  glucose logs , discussing his hypoglycemia and hyperglycemia episodes, reviewing his current and  previous  labs / studies  ( including abstraction from other facilities) and medications  doses and developing a  long term treatment plan and documenting his care.   Please refer to Patient Instructions for Blood Glucose Monitoring and Insulin/Medications Dosing Guide"  in media tab for additional information. Please  also refer to " Patient Self Inventory" in the Media  tab for reviewed elements of pertinent patient history.  Dylan Brittle. participated in the discussions, expressed understanding, and voiced agreement with the above plans.  All questions were answered to his satisfaction. he is encouraged to contact clinic should he have any questions or concerns prior to his return visit.   Follow up plan: - Return in about 4 months (around 07/24/2020) for Bring Meter and Logs- A1c in Office.  Glade Lloyd, MD Phone: [phone]  Fax: [phone]  -  This note was partially dictated with voice recognition software. Similar sounding words can be transcribed inadequately or may not  be corrected upon review.  03/24/2020, 1:47 PM

## 2020-03-24 NOTE — Patient Instructions (Signed)

## 2020-04-11 ENCOUNTER — Telehealth: Payer: Self-pay | Admitting: "Endocrinology

## 2020-04-11 NOTE — Telephone Encounter (Signed)
Advise to increase Tresiba to 120 units nightly, increase Humalog to 24 units 3 times daily  for pre-meal blood glucose readings greater than 90 mg per DL, plus correction for BG above 150.

## 2020-04-11 NOTE — Telephone Encounter (Signed)
Discussed with pt, understanding voiced. 

## 2020-04-11 NOTE — Telephone Encounter (Signed)
Readings  5/12 - 243, 242, 265,  5/13- 273, 144, 298, 235 5/14- 233, 131, 143, 130 5/15 - 247, 148, 250, 266 5/16 - 246, 206, 197, 244 5/17 -282,   Please advise.

## 2020-04-13 ENCOUNTER — Other Ambulatory Visit: Payer: Self-pay | Admitting: "Endocrinology

## 2020-04-21 DIAGNOSIS — C9211 Chronic myeloid leukemia, BCR/ABL-positive, in remission: Secondary | ICD-10-CM | POA: Diagnosis not present

## 2020-04-25 DIAGNOSIS — I129 Hypertensive chronic kidney disease with stage 1 through stage 4 chronic kidney disease, or unspecified chronic kidney disease: Secondary | ICD-10-CM | POA: Diagnosis not present

## 2020-04-25 DIAGNOSIS — N1832 Chronic kidney disease, stage 3b: Secondary | ICD-10-CM | POA: Diagnosis not present

## 2020-04-25 DIAGNOSIS — E1140 Type 2 diabetes mellitus with diabetic neuropathy, unspecified: Secondary | ICD-10-CM | POA: Diagnosis not present

## 2020-04-25 DIAGNOSIS — Z794 Long term (current) use of insulin: Secondary | ICD-10-CM | POA: Diagnosis not present

## 2020-04-25 DIAGNOSIS — E1122 Type 2 diabetes mellitus with diabetic chronic kidney disease: Secondary | ICD-10-CM | POA: Diagnosis not present

## 2020-04-27 DIAGNOSIS — C9211 Chronic myeloid leukemia, BCR/ABL-positive, in remission: Secondary | ICD-10-CM | POA: Diagnosis not present

## 2020-05-02 ENCOUNTER — Other Ambulatory Visit: Payer: Self-pay | Admitting: "Endocrinology

## 2020-05-04 DIAGNOSIS — N182 Chronic kidney disease, stage 2 (mild): Secondary | ICD-10-CM | POA: Diagnosis not present

## 2020-05-04 DIAGNOSIS — I10 Essential (primary) hypertension: Secondary | ICD-10-CM | POA: Diagnosis not present

## 2020-05-04 DIAGNOSIS — M1712 Unilateral primary osteoarthritis, left knee: Secondary | ICD-10-CM | POA: Diagnosis not present

## 2020-05-04 DIAGNOSIS — M1611 Unilateral primary osteoarthritis, right hip: Secondary | ICD-10-CM | POA: Diagnosis not present

## 2020-05-04 DIAGNOSIS — C9210 Chronic myeloid leukemia, BCR/ABL-positive, not having achieved remission: Secondary | ICD-10-CM | POA: Diagnosis not present

## 2020-05-08 ENCOUNTER — Other Ambulatory Visit: Payer: Self-pay | Admitting: "Endocrinology

## 2020-05-09 DIAGNOSIS — M25562 Pain in left knee: Secondary | ICD-10-CM | POA: Diagnosis not present

## 2020-05-09 DIAGNOSIS — R262 Difficulty in walking, not elsewhere classified: Secondary | ICD-10-CM | POA: Diagnosis not present

## 2020-05-09 DIAGNOSIS — M5440 Lumbago with sciatica, unspecified side: Secondary | ICD-10-CM | POA: Diagnosis not present

## 2020-05-09 DIAGNOSIS — M25551 Pain in right hip: Secondary | ICD-10-CM | POA: Diagnosis not present

## 2020-05-09 DIAGNOSIS — M1990 Unspecified osteoarthritis, unspecified site: Secondary | ICD-10-CM | POA: Diagnosis not present

## 2020-05-13 DIAGNOSIS — G894 Chronic pain syndrome: Secondary | ICD-10-CM | POA: Diagnosis not present

## 2020-05-13 DIAGNOSIS — R7989 Other specified abnormal findings of blood chemistry: Secondary | ICD-10-CM | POA: Diagnosis not present

## 2020-05-13 DIAGNOSIS — E1129 Type 2 diabetes mellitus with other diabetic kidney complication: Secondary | ICD-10-CM | POA: Diagnosis not present

## 2020-05-16 DIAGNOSIS — Z794 Long term (current) use of insulin: Secondary | ICD-10-CM | POA: Diagnosis not present

## 2020-05-16 DIAGNOSIS — E1165 Type 2 diabetes mellitus with hyperglycemia: Secondary | ICD-10-CM | POA: Diagnosis not present

## 2020-05-20 ENCOUNTER — Other Ambulatory Visit: Payer: Self-pay | Admitting: "Endocrinology

## 2020-05-25 DIAGNOSIS — I129 Hypertensive chronic kidney disease with stage 1 through stage 4 chronic kidney disease, or unspecified chronic kidney disease: Secondary | ICD-10-CM | POA: Diagnosis not present

## 2020-05-25 DIAGNOSIS — N1832 Chronic kidney disease, stage 3b: Secondary | ICD-10-CM | POA: Diagnosis not present

## 2020-05-25 DIAGNOSIS — E1140 Type 2 diabetes mellitus with diabetic neuropathy, unspecified: Secondary | ICD-10-CM | POA: Diagnosis not present

## 2020-05-25 DIAGNOSIS — E1122 Type 2 diabetes mellitus with diabetic chronic kidney disease: Secondary | ICD-10-CM | POA: Diagnosis not present

## 2020-05-25 DIAGNOSIS — Z794 Long term (current) use of insulin: Secondary | ICD-10-CM | POA: Diagnosis not present

## 2020-05-26 DIAGNOSIS — E1165 Type 2 diabetes mellitus with hyperglycemia: Secondary | ICD-10-CM | POA: Diagnosis not present

## 2020-05-26 DIAGNOSIS — Z794 Long term (current) use of insulin: Secondary | ICD-10-CM | POA: Diagnosis not present

## 2020-06-09 DIAGNOSIS — M549 Dorsalgia, unspecified: Secondary | ICD-10-CM | POA: Diagnosis not present

## 2020-06-09 DIAGNOSIS — Z794 Long term (current) use of insulin: Secondary | ICD-10-CM | POA: Diagnosis not present

## 2020-06-09 DIAGNOSIS — Z79899 Other long term (current) drug therapy: Secondary | ICD-10-CM | POA: Diagnosis not present

## 2020-06-09 DIAGNOSIS — N182 Chronic kidney disease, stage 2 (mild): Secondary | ICD-10-CM | POA: Diagnosis not present

## 2020-06-09 DIAGNOSIS — C9211 Chronic myeloid leukemia, BCR/ABL-positive, in remission: Secondary | ICD-10-CM | POA: Diagnosis not present

## 2020-06-09 DIAGNOSIS — M25562 Pain in left knee: Secondary | ICD-10-CM | POA: Diagnosis not present

## 2020-06-09 DIAGNOSIS — E1122 Type 2 diabetes mellitus with diabetic chronic kidney disease: Secondary | ICD-10-CM | POA: Diagnosis not present

## 2020-06-09 DIAGNOSIS — G8929 Other chronic pain: Secondary | ICD-10-CM | POA: Diagnosis not present

## 2020-06-10 DIAGNOSIS — E1129 Type 2 diabetes mellitus with other diabetic kidney complication: Secondary | ICD-10-CM | POA: Diagnosis not present

## 2020-06-10 DIAGNOSIS — E211 Secondary hyperparathyroidism, not elsewhere classified: Secondary | ICD-10-CM | POA: Diagnosis not present

## 2020-06-10 DIAGNOSIS — R809 Proteinuria, unspecified: Secondary | ICD-10-CM | POA: Diagnosis not present

## 2020-06-10 DIAGNOSIS — N189 Chronic kidney disease, unspecified: Secondary | ICD-10-CM | POA: Diagnosis not present

## 2020-06-10 DIAGNOSIS — C9210 Chronic myeloid leukemia, BCR/ABL-positive, not having achieved remission: Secondary | ICD-10-CM | POA: Diagnosis not present

## 2020-06-10 DIAGNOSIS — E1122 Type 2 diabetes mellitus with diabetic chronic kidney disease: Secondary | ICD-10-CM | POA: Diagnosis not present

## 2020-06-13 DIAGNOSIS — Z794 Long term (current) use of insulin: Secondary | ICD-10-CM | POA: Diagnosis not present

## 2020-06-13 DIAGNOSIS — E119 Type 2 diabetes mellitus without complications: Secondary | ICD-10-CM | POA: Diagnosis not present

## 2020-06-13 DIAGNOSIS — E1136 Type 2 diabetes mellitus with diabetic cataract: Secondary | ICD-10-CM | POA: Diagnosis not present

## 2020-06-13 DIAGNOSIS — H2513 Age-related nuclear cataract, bilateral: Secondary | ICD-10-CM | POA: Diagnosis not present

## 2020-06-13 LAB — HM DIABETES EYE EXAM

## 2020-06-17 DIAGNOSIS — I129 Hypertensive chronic kidney disease with stage 1 through stage 4 chronic kidney disease, or unspecified chronic kidney disease: Secondary | ICD-10-CM | POA: Diagnosis not present

## 2020-06-17 DIAGNOSIS — E872 Acidosis: Secondary | ICD-10-CM | POA: Diagnosis not present

## 2020-06-17 DIAGNOSIS — N189 Chronic kidney disease, unspecified: Secondary | ICD-10-CM | POA: Diagnosis not present

## 2020-06-17 DIAGNOSIS — R809 Proteinuria, unspecified: Secondary | ICD-10-CM | POA: Diagnosis not present

## 2020-06-23 ENCOUNTER — Other Ambulatory Visit: Payer: Self-pay | Admitting: "Endocrinology

## 2020-06-24 DIAGNOSIS — Z794 Long term (current) use of insulin: Secondary | ICD-10-CM | POA: Diagnosis not present

## 2020-06-24 DIAGNOSIS — E1122 Type 2 diabetes mellitus with diabetic chronic kidney disease: Secondary | ICD-10-CM | POA: Diagnosis not present

## 2020-06-24 DIAGNOSIS — I129 Hypertensive chronic kidney disease with stage 1 through stage 4 chronic kidney disease, or unspecified chronic kidney disease: Secondary | ICD-10-CM | POA: Diagnosis not present

## 2020-06-24 DIAGNOSIS — E1140 Type 2 diabetes mellitus with diabetic neuropathy, unspecified: Secondary | ICD-10-CM | POA: Diagnosis not present

## 2020-06-29 DIAGNOSIS — E1165 Type 2 diabetes mellitus with hyperglycemia: Secondary | ICD-10-CM | POA: Diagnosis not present

## 2020-06-29 DIAGNOSIS — Z794 Long term (current) use of insulin: Secondary | ICD-10-CM | POA: Diagnosis not present

## 2020-07-05 ENCOUNTER — Encounter: Payer: Self-pay | Admitting: Physician Assistant

## 2020-07-05 ENCOUNTER — Ambulatory Visit: Payer: Medicare Other | Admitting: Physician Assistant

## 2020-07-05 ENCOUNTER — Other Ambulatory Visit: Payer: Self-pay

## 2020-07-05 VITALS — BP 134/82 | HR 92 | Ht 73.0 in | Wt 254.4 lb

## 2020-07-05 DIAGNOSIS — C9210 Chronic myeloid leukemia, BCR/ABL-positive, not having achieved remission: Secondary | ICD-10-CM | POA: Diagnosis not present

## 2020-07-05 DIAGNOSIS — E785 Hyperlipidemia, unspecified: Secondary | ICD-10-CM

## 2020-07-05 DIAGNOSIS — E119 Type 2 diabetes mellitus without complications: Secondary | ICD-10-CM | POA: Diagnosis not present

## 2020-07-05 DIAGNOSIS — I10 Essential (primary) hypertension: Secondary | ICD-10-CM

## 2020-07-05 DIAGNOSIS — Z01818 Encounter for other preprocedural examination: Secondary | ICD-10-CM

## 2020-07-05 NOTE — Progress Notes (Signed)
Cardiology Office Note:    Date:  07/07/2020   ID:  Dylan Brittle., DOB 01-13-1956, MRN [id number]  PCP:  Sharilyn Sites, MD  [facility] Cardiologist:  Shelva Majestic, MD  [place] Electrophysiologist:  None   Referring MD: Sharilyn Sites, MD   Chief Complaint  Patient presents with  . Follow-up    History of Present Illness:    Dylan Guandique. is a 64 y.o. male with a hx of hypertension, hyperlipidemia, DM 2, CKD, CML diagnosed in 2004, and chronic back pain.  He developed nitro responsive chest pain in 2010 and a subsequent cardiac catheterization revealed normal coronary arteries with normal LV function.  Due to recurrent chest discomfort in 2013, he had repeat Myoview and was able to accomplish 9 METS of workload, there was a question of borderline mild ischemia in the basal inferior wall, EF 54%.  Echocardiogram showed normal EF with normal diastolic function, mild aortic sclerosis.  He had a previous proteinuria in the urine and underwent renal biopsy which showed diabetic glomerulosclerosis.  This is being followed by nephrology Dr. Lowanda Foster.  Patient was last seen by oncology service at [facility] in July, his CML remains in remission.  He was previously on Soddy-Daisy which was stopped in July 2019.  Patient presents today along with his wife.  He denies any recent chest pain or shortness of breath.  He is mobility is limited by hip and knee pain.  He is planning to see orthopedic physician who likely will proceed with surgery.  Given the fact he has normal coronary arteries in 2013, he will be a low risk patient for the intended surgery.  Otherwise he has no lower extremity edema, orthopnea or PND.  He may hold aspirin for 5 to 7 days prior to the surgery and restart as soon as possible afterward.  Otherwise he can follow-up in 6 months.  I have given him a copy of cardiac clearance letter and also his EKG today.   Past Medical History:  Diagnosis Date  . Chronic back pain    . CML (chronic myelocytic leukemia) ([place])   . Diabetes mellitus without complication ([place])   . History of stress test    myoview study suggested the possibility of basal inferior wall ischemia which was present in all 3 tonographic views.  [name] Hx of echocardiogram 05/2012   EF 55%Mildly hypertrophic left ventricle with normal systolic function and early signs of mild diastolic dysfuntion. Moderately dilated left atrium, mild aortic valve sclerosis with out stenosis.  . Hyperlipemia   . Hypertension   . Leukemia [facility])     Past Surgical History:  Procedure Laterality Date  . BACK SURGERY  12/2006  . CARDIAC CATHETERIZATION  2010   revealed mild nonobstructive CAD  . CARDIAC CATHETERIZATION  07/2012   This showed essentially normal coronary arteries without obstruction. there was normal LV function.  . COLONOSCOPY WITH PROPOFOL N/A 07/14/2018   Procedure: COLONOSCOPY WITH PROPOFOL;  Surgeon: Daneil Dolin, MD;  [facility];  Service: Endoscopy;  Laterality: N/A;  10:45am  . KNEE SURGERY Left 01/1994  . LEFT HEART CATHETERIZATION WITH CORONARY ANGIOGRAM N/A 07/29/2012   Procedure: LEFT HEART CATHETERIZATION WITH CORONARY ANGIOGRAM;  Surgeon: Troy Sine, MD;  Location: [facility] CATH LAB;  Service: Cardiovascular;  Laterality: N/A;  . POLYPECTOMY  07/14/2018   Procedure: POLYPECTOMY;  Surgeon: Daneil Dolin, MD;  [facility];  Service: Endoscopy;;  colon    Current Medications:  Current Meds  Medication Sig  . ACCU-CHEK AVIVA PLUS test strip USE TO TEST 4 TIMES DAILY.  [name] Accu-Chek Softclix Lancets lancets USE TO TEST 4 TIMES DAILY.  [name] amLODipine (NORVASC) 10 MG tablet Take 10 mg by mouth daily.  [name] aspirin EC 81 MG tablet Take 81 mg by mouth daily.  [name] BYSTOLIC 10 MG tablet TAKE ONE TABLET BY MOUTH EVERY DAY. (Patient taking differently: Take 10 mg by mouth daily. )  . Cholecalciferol (VITAMIN D3) 1000000 UNIT/GM LIQD by Does not apply route.  . insulin degludec  (TRESIBA FLEXTOUCH) 200 UNIT/ML FlexTouch Pen Inject 120 Units into the skin at bedtime.  . insulin lispro (HUMALOG) 100 UNIT/ML KwikPen INJECT 20-26 UNITS INTO THE SKIN 3 TIMES DAILY BEFORE MEALS.  [name] Insulin Pen Needle (SURE COMFORT PEN NEEDLES) 31G X 8 MM MISC USING WITH INSULIN INJECTIONS 4X DAILY.  . Multiple Vitamin (MULTIVITAMIN WITH MINERALS) TABS Take 1 tablet by mouth daily.  [name] olmesartan (BENICAR) 40 MG tablet Take 40 mg by mouth daily.  . Oxycodone HCl 10 MG TABS Take 10 mg by mouth daily as needed (for pain.).   [name] rosuvastatin (CRESTOR) 40 MG tablet Take 1 tablet (40 mg total) by mouth daily.  . sodium bicarbonate 650 MG tablet Take 650 mg by mouth 3 (three) times daily.  . [DISCONTINUED] Continuous Blood Gluc Sensor (FREESTYLE LIBRE 14 DAY SENSOR) MISC Inject 1 each into the skin every 14 (fourteen) days. Use as directed.     Allergies:   Patient has no known allergies.   Social History   Socioeconomic History  . Marital status: Married    Spouse name: Not on file  . Number of children: Not on file  . Years of education: Not on file  . Highest education level: Not on file  Occupational History  . Not on file  Tobacco Use  . Smoking status: Former Smoker    Packs/day: 0.25    Years: 15.00    Pack years: 3.75    Types: Cigarettes    Quit date: 07/08/1987    Years since quitting: 33.0  . Smokeless tobacco: Never Used  . Tobacco comment: "30 years ago"  Vaping Use  . Vaping Use: Never used  Substance and Sexual Activity  . Alcohol use: Yes    Comment: "very seldom"  . Drug use: No  . Sexual activity: Yes    Birth control/protection: None  Other Topics Concern  . Not on file  Social History Narrative  . Not on file   Social Determinants of Health   Financial Resource Strain:   . Difficulty of Paying Living Expenses:   Food Insecurity:   . Worried About Charity fundraiser in the Last Year:   . Arboriculturist in the Last Year:   Transportation Needs:   .  Film/video editor (Medical):   [name] Lack of Transportation (Non-Medical):   Physical Activity:   . Days of Exercise per Week:   . Minutes of Exercise per Session:   Stress:   . Feeling of Stress :   Social Connections:   . Frequency of Communication with Friends and Family:   . Frequency of Social Gatherings with Friends and Family:   . Attends Religious Services:   . Active Member of Clubs or Organizations:   . Attends Archivist Meetings:   [name] Marital Status:      Family History: The patient's family history includes Cancer in his father; Colon cancer (  age of onset: 57) in his mother.  ROS:   Please see the history of present illness.     All other systems reviewed and are negative.  EKGs/Labs/Other Studies Reviewed:    The following studies were reviewed today:  N/A  EKG:  EKG is ordered today.  The ekg ordered today demonstrates normal sinus rhythm without significant ST-T wave changes  Recent Labs: 03/18/2020: ALT 24; BUN 24; Creat 1.45; Potassium 4.6; Sodium 138; TSH 2.38  Recent Lipid Panel    Component Value Date/Time   CHOL 136 03/18/2020 0803   CHOL 150 12/17/2019 0917   TRIG 111 03/18/2020 0803   HDL 39 (L) 03/18/2020 0803   HDL 41 12/17/2019 0917   CHOLHDL 3.5 03/18/2020 0803   LDLCALC 78 03/18/2020 0803    Physical Exam:    VS:  BP 134/82 (BP Location: Left Arm, Patient Position: Sitting, Cuff Size: Large)   Pulse 92   Ht 6\' 1"  (1.854 m)   Wt 254 lb 6.4 oz (115.4 kg)   SpO2 97%   BMI 33.56 kg/m     Wt Readings from Last 3 Encounters:  07/05/20 254 lb 6.4 oz (115.4 kg)  03/24/20 261 lb 1.6 oz (118.4 kg)  12/15/19 259 lb 9.6 oz (117.8 kg)     GEN:  Well nourished, well developed in no acute distress HEENT: Normal NECK: No JVD; No carotid bruits LYMPHATICS: No lymphadenopathy CARDIAC: RRR, no murmurs, rubs, gallops RESPIRATORY:  Clear to auscultation without rales, wheezing or rhonchi  ABDOMEN: Soft, non-tender,  non-distended MUSCULOSKELETAL:  No edema; No deformity  SKIN: Warm and dry NEUROLOGIC:  Alert and oriented x 3 PSYCHIATRIC:  Normal affect   ASSESSMENT:    1. Pre-operative clearance   2. Hyperlipidemia with target LDL less than 70   3. Essential hypertension   4. Controlled type 2 diabetes mellitus without complication, without long-term current use of insulin ([place])   5. CML (chronic myelocytic leukemia) ([place])    PLAN:    In order of problems listed above:  1. Preoperative clearance: Patient does not have any prior history of CAD.  He denies any recent chest pain or shortness of breath.  Previous cardiac catheterization in 2013 showed normal coronary arteries.  He may proceed with orthopedic surgeries.  If needed may hold aspirin for 5 to 7 days prior to the surgery and restart as soon as possible afterward.  2. Hypertension: Blood pressure stable  3. Hyperlipidemia: on Crestor  4. DM 2: On insulin managed by primary care provider  5. History of CML: Followed by hematology service.   Medication Adjustments/Labs and Tests Ordered: Current medicines are reviewed at length with the patient today.  Concerns regarding medicines are outlined above.  Orders Placed This Encounter  Procedures  . EKG 12-Lead   No orders of the defined types were placed in this encounter.   Patient Instructions  Medication Instructions:   HOLD Aspirin 7 days prior to surgery and restart as soon as possible at the surgeons discretion  *If you need a refill on your cardiac medications before your next appointment, please call your pharmacy*  Lab Work: NONE ordered at this time of appointment   If you have labs (blood work) drawn today and your tests are completely normal, you will receive your results only by: [name] MyChart Message (if you have MyChart) OR . A paper copy in the mail If you have any lab test that is abnormal or we need to change your treatment,  we will call you to review the  results.  Testing/Procedures: NONE ordered at this time of appointment   Follow-Up: At [facility], you and your health needs are our priority.  As part of our continuing mission to provide you with exceptional heart care, we have created designated Provider Care Teams.  These Care Teams include your primary Cardiologist (physician) and Advanced Practice Providers (APPs -  Physician Assistants and Nurse Practitioners) who all work together to provide you with the care you need, when you need it.  We recommend signing up for the patient portal called "MyChart".  Sign up information is provided on this After Visit Summary.  MyChart is used to connect with patients for Virtual Visits (Telemedicine).  Patients are able to view lab/test results, encounter notes, upcoming appointments, etc.  Non-urgent messages can be sent to your provider as well.   To learn more about what you can do with MyChart, go to [email or web address].    Your next appointment:   6 month(s)  The format for your next appointment:   In Person  Provider:   Shelva Majestic, MD  Other Instructions      Signed, Almyra Deforest, [place]  07/07/2020 10:35 PM    [place]

## 2020-07-05 NOTE — Patient Instructions (Signed)
Medication Instructions:   HOLD Aspirin 7 days prior to surgery and restart as soon as possible at the surgeons discretion  *If you need a refill on your cardiac medications before your next appointment, please call your pharmacy*  Lab Work: NONE ordered at this time of appointment   If you have labs (blood work) drawn today and your tests are completely normal, you will receive your results only by: [name] MyChart Message (if you have MyChart) OR . A paper copy in the mail If you have any lab test that is abnormal or we need to change your treatment, we will call you to review the results.  Testing/Procedures: NONE ordered at this time of appointment   Follow-Up: At [facility], you and your health needs are our priority.  As part of our continuing mission to provide you with exceptional heart care, we have created designated Provider Care Teams.  These Care Teams include your primary Cardiologist (physician) and Advanced Practice Providers (APPs -  Physician Assistants and Nurse Practitioners) who all work together to provide you with the care you need, when you need it.  We recommend signing up for the patient portal called "MyChart".  Sign up information is provided on this After Visit Summary.  MyChart is used to connect with patients for Virtual Visits (Telemedicine).  Patients are able to view lab/test results, encounter notes, upcoming appointments, etc.  Non-urgent messages can be sent to your provider as well.   To learn more about what you can do with MyChart, go to [email or web address].    Your next appointment:   6 month(s)  The format for your next appointment:   In Person  Provider:   Shelva Majestic, MD  Other Instructions

## 2020-07-05 NOTE — Progress Notes (Signed)
   Primary Cardiologist: Shelva Majestic, MD  Mr. Dylan Spears. (DOB [birth year]) was seen in the cardiology office on 07/05/2020 for 6 month follow up. He has no recent chest pain. EKG obtained in the office showed no ischemic changes. Previous cardiac catheterization performed by Dr. Claiborne Billings on 07/29/2012 showed normal coronary arteries. He is a low risk patient for the intended orthopedic surgery. He may hold aspirin for 7 days prior to the procedure and restart as soon as possible after the surgery at the surgeon's discretion.   Please call with questions.  [place], [place] 07/05/2020, 3:44 PM

## 2020-07-20 DIAGNOSIS — M1712 Unilateral primary osteoarthritis, left knee: Secondary | ICD-10-CM | POA: Diagnosis not present

## 2020-07-20 DIAGNOSIS — M1611 Unilateral primary osteoarthritis, right hip: Secondary | ICD-10-CM | POA: Diagnosis not present

## 2020-07-22 ENCOUNTER — Other Ambulatory Visit: Payer: Self-pay | Admitting: "Endocrinology

## 2020-07-26 DIAGNOSIS — I129 Hypertensive chronic kidney disease with stage 1 through stage 4 chronic kidney disease, or unspecified chronic kidney disease: Secondary | ICD-10-CM | POA: Diagnosis not present

## 2020-07-26 DIAGNOSIS — E1122 Type 2 diabetes mellitus with diabetic chronic kidney disease: Secondary | ICD-10-CM | POA: Diagnosis not present

## 2020-07-26 DIAGNOSIS — Z794 Long term (current) use of insulin: Secondary | ICD-10-CM | POA: Diagnosis not present

## 2020-07-26 DIAGNOSIS — E1140 Type 2 diabetes mellitus with diabetic neuropathy, unspecified: Secondary | ICD-10-CM | POA: Diagnosis not present

## 2020-07-27 ENCOUNTER — Ambulatory Visit: Payer: Medicare Other | Admitting: "Endocrinology

## 2020-07-28 DIAGNOSIS — Z794 Long term (current) use of insulin: Secondary | ICD-10-CM | POA: Diagnosis not present

## 2020-07-28 DIAGNOSIS — E1165 Type 2 diabetes mellitus with hyperglycemia: Secondary | ICD-10-CM | POA: Diagnosis not present

## 2020-08-12 ENCOUNTER — Other Ambulatory Visit: Payer: Self-pay | Admitting: "Endocrinology

## 2020-08-15 DIAGNOSIS — Z23 Encounter for immunization: Secondary | ICD-10-CM | POA: Diagnosis not present

## 2020-08-15 DIAGNOSIS — I10 Essential (primary) hypertension: Secondary | ICD-10-CM | POA: Diagnosis not present

## 2020-08-15 DIAGNOSIS — E039 Hypothyroidism, unspecified: Secondary | ICD-10-CM | POA: Diagnosis not present

## 2020-08-15 DIAGNOSIS — C9210 Chronic myeloid leukemia, BCR/ABL-positive, not having achieved remission: Secondary | ICD-10-CM | POA: Diagnosis not present

## 2020-08-15 DIAGNOSIS — G894 Chronic pain syndrome: Secondary | ICD-10-CM | POA: Diagnosis not present

## 2020-08-15 DIAGNOSIS — E785 Hyperlipidemia, unspecified: Secondary | ICD-10-CM | POA: Diagnosis not present

## 2020-08-15 DIAGNOSIS — Z794 Long term (current) use of insulin: Secondary | ICD-10-CM | POA: Diagnosis not present

## 2020-08-17 DIAGNOSIS — E1165 Type 2 diabetes mellitus with hyperglycemia: Secondary | ICD-10-CM | POA: Diagnosis not present

## 2020-08-17 DIAGNOSIS — Z794 Long term (current) use of insulin: Secondary | ICD-10-CM | POA: Diagnosis not present

## 2020-08-23 DIAGNOSIS — N182 Chronic kidney disease, stage 2 (mild): Secondary | ICD-10-CM | POA: Diagnosis not present

## 2020-08-23 DIAGNOSIS — E1169 Type 2 diabetes mellitus with other specified complication: Secondary | ICD-10-CM | POA: Diagnosis not present

## 2020-08-23 DIAGNOSIS — C9210 Chronic myeloid leukemia, BCR/ABL-positive, not having achieved remission: Secondary | ICD-10-CM | POA: Diagnosis not present

## 2020-08-23 DIAGNOSIS — Z20822 Contact with and (suspected) exposure to covid-19: Secondary | ICD-10-CM | POA: Diagnosis not present

## 2020-08-23 DIAGNOSIS — M1611 Unilateral primary osteoarthritis, right hip: Secondary | ICD-10-CM | POA: Diagnosis not present

## 2020-08-23 DIAGNOSIS — Z01818 Encounter for other preprocedural examination: Secondary | ICD-10-CM | POA: Diagnosis not present

## 2020-08-23 DIAGNOSIS — I10 Essential (primary) hypertension: Secondary | ICD-10-CM | POA: Diagnosis not present

## 2020-08-25 DIAGNOSIS — M25751 Osteophyte, right hip: Secondary | ICD-10-CM | POA: Diagnosis not present

## 2020-08-25 DIAGNOSIS — M1712 Unilateral primary osteoarthritis, left knee: Secondary | ICD-10-CM | POA: Diagnosis not present

## 2020-08-25 DIAGNOSIS — Z794 Long term (current) use of insulin: Secondary | ICD-10-CM | POA: Diagnosis not present

## 2020-08-25 DIAGNOSIS — E785 Hyperlipidemia, unspecified: Secondary | ICD-10-CM | POA: Diagnosis not present

## 2020-08-25 DIAGNOSIS — N183 Chronic kidney disease, stage 3 unspecified: Secondary | ICD-10-CM | POA: Diagnosis not present

## 2020-08-25 DIAGNOSIS — Z96641 Presence of right artificial hip joint: Secondary | ICD-10-CM | POA: Diagnosis not present

## 2020-08-25 DIAGNOSIS — M1611 Unilateral primary osteoarthritis, right hip: Secondary | ICD-10-CM | POA: Diagnosis not present

## 2020-08-25 DIAGNOSIS — E1140 Type 2 diabetes mellitus with diabetic neuropathy, unspecified: Secondary | ICD-10-CM | POA: Diagnosis not present

## 2020-08-25 DIAGNOSIS — Z471 Aftercare following joint replacement surgery: Secondary | ICD-10-CM | POA: Diagnosis not present

## 2020-08-25 DIAGNOSIS — E7849 Other hyperlipidemia: Secondary | ICD-10-CM | POA: Diagnosis not present

## 2020-08-25 DIAGNOSIS — N2581 Secondary hyperparathyroidism of renal origin: Secondary | ICD-10-CM | POA: Diagnosis not present

## 2020-08-25 DIAGNOSIS — E1122 Type 2 diabetes mellitus with diabetic chronic kidney disease: Secondary | ICD-10-CM | POA: Diagnosis not present

## 2020-08-25 DIAGNOSIS — G8918 Other acute postprocedural pain: Secondary | ICD-10-CM | POA: Diagnosis not present

## 2020-08-25 DIAGNOSIS — G8929 Other chronic pain: Secondary | ICD-10-CM | POA: Diagnosis not present

## 2020-08-25 DIAGNOSIS — Z87891 Personal history of nicotine dependence: Secondary | ICD-10-CM | POA: Diagnosis not present

## 2020-08-25 DIAGNOSIS — Z7982 Long term (current) use of aspirin: Secondary | ICD-10-CM | POA: Diagnosis not present

## 2020-08-25 DIAGNOSIS — C9211 Chronic myeloid leukemia, BCR/ABL-positive, in remission: Secondary | ICD-10-CM | POA: Diagnosis not present

## 2020-08-25 DIAGNOSIS — I129 Hypertensive chronic kidney disease with stage 1 through stage 4 chronic kidney disease, or unspecified chronic kidney disease: Secondary | ICD-10-CM | POA: Diagnosis not present

## 2020-08-25 DIAGNOSIS — Z79899 Other long term (current) drug therapy: Secondary | ICD-10-CM | POA: Diagnosis not present

## 2020-08-25 DIAGNOSIS — M549 Dorsalgia, unspecified: Secondary | ICD-10-CM | POA: Diagnosis not present

## 2020-08-25 DIAGNOSIS — I10 Essential (primary) hypertension: Secondary | ICD-10-CM | POA: Diagnosis not present

## 2020-08-25 DIAGNOSIS — E1136 Type 2 diabetes mellitus with diabetic cataract: Secondary | ICD-10-CM | POA: Diagnosis not present

## 2020-09-04 ENCOUNTER — Encounter: Payer: Self-pay | Admitting: Emergency Medicine

## 2020-09-04 ENCOUNTER — Ambulatory Visit
Admission: EM | Admit: 2020-09-04 | Discharge: 2020-09-04 | Disposition: A | Discharge status: Home / Self Care | Payer: Medicare Other

## 2020-09-04 ENCOUNTER — Other Ambulatory Visit: Payer: Self-pay

## 2020-09-04 DIAGNOSIS — T148XXA Other injury of unspecified body region, initial encounter: Secondary | ICD-10-CM | POA: Diagnosis not present

## 2020-09-04 NOTE — ED Provider Notes (Signed)
[place]   [id number] 09/04/20 Arrival Time: 0811   Chief Complaint  Patient presents with  . Wound Check     SUBJECTIVE: History from: patient and family.  Dylan Spears. is a 64 y.o. male who presented to the urgent care for complaint of wound check.  Report he had arthroplasty acetabular and proximal femur prosthetic replacement completed on 08/25/2020.  Has been taking aspirin as a prophylactic for DVT.  Report he started bleeding at the site of the incision on 09/02/2020.  Wife report he had saturated 3 pads from 2:30 PM on 09/03/2020 until this morning.  Has called [facility] and was advised to go to the nearest urgent care.  Denies any precipitating event.  Denies pus or purulent drainage.  Denies chills, fever, erythema, nausea, vomiting, diarrhea.     ROS: As per HPI.  All other pertinent ROS negative.      Past Medical History:  Diagnosis Date  . Chronic back pain   . CML (chronic myelocytic leukemia) ([place])   . Diabetes mellitus without complication ([place])   . History of stress test    myoview study suggested the possibility of basal inferior wall ischemia which was present in all 3 tonographic views.  [name] Hx of echocardiogram 05/2012   EF 55%Mildly hypertrophic left ventricle with normal systolic function and early signs of mild diastolic dysfuntion. Moderately dilated left atrium, mild aortic valve sclerosis with out stenosis.  . Hyperlipemia   . Hypertension   . Leukemia [facility])    Past Surgical History:  Procedure Laterality Date  . BACK SURGERY  12/2006  . CARDIAC CATHETERIZATION  2010   revealed mild nonobstructive CAD  . CARDIAC CATHETERIZATION  07/2012   This showed essentially normal coronary arteries without obstruction. there was normal LV function.  . COLONOSCOPY WITH PROPOFOL N/A 07/14/2018   Procedure: COLONOSCOPY WITH PROPOFOL;  Surgeon: Daneil Dolin, MD;  [facility];  Service: Endoscopy;  Laterality: N/A;  10:45am  . KNEE  SURGERY Left 01/1994  . LEFT HEART CATHETERIZATION WITH CORONARY ANGIOGRAM N/A 07/29/2012   Procedure: LEFT HEART CATHETERIZATION WITH CORONARY ANGIOGRAM;  Surgeon: Troy Sine, MD;  Location: [facility] CATH LAB;  Service: Cardiovascular;  Laterality: N/A;  . POLYPECTOMY  07/14/2018   Procedure: POLYPECTOMY;  Surgeon: Daneil Dolin, MD;  [facility];  Service: Endoscopy;;  colon  . TOTAL HIP ARTHROPLASTY Right    No Known Allergies No current facility-administered medications on file prior to encounter.   Current Outpatient Medications on File Prior to Encounter  Medication Sig Dispense Refill  . ACCU-CHEK AVIVA PLUS test strip USE TO TEST 4 TIMES DAILY. 200 strip 3  . Accu-Chek Softclix Lancets lancets USE TO TEST 4 TIMES DAILY. 200 each 3  . amLODipine (NORVASC) 10 MG tablet Take 10 mg by mouth daily.    [name] aspirin EC 81 MG tablet Take 81 mg by mouth daily.    [name] BYSTOLIC 10 MG tablet TAKE ONE TABLET BY MOUTH EVERY DAY. (Patient taking differently: Take 10 mg by mouth daily. ) 30 tablet 4  . Cholecalciferol (VITAMIN D3) 1000000 UNIT/GM LIQD by Does not apply route.    . insulin lispro (HUMALOG) 100 UNIT/ML KwikPen INJECT 20-26 UNITS INTO THE SKIN 3 TIMES DAILY BEFORE MEALS. 15 mL 0  . Insulin Pen Needle (SURE COMFORT PEN NEEDLES) 31G X 8 MM MISC USING WITH INSULIN INJECTIONS 4X DAILY. 100 each 3  . Multiple Vitamin (MULTIVITAMIN WITH MINERALS) TABS  Take 1 tablet by mouth daily.    [name] olmesartan (BENICAR) 40 MG tablet Take 40 mg by mouth daily.    . Oxycodone HCl 10 MG TABS Take 10 mg by mouth daily as needed (for pain.).     [name] OZEMPIC, 0.25 OR 0.5 MG/DOSE, 2 MG/1.5ML SOPN Inject 0.5 mg into the skin once a week.    . rosuvastatin (CRESTOR) 40 MG tablet Take 1 tablet (40 mg total) by mouth daily. 90 tablet 3  . sodium bicarbonate 650 MG tablet Take 650 mg by mouth 3 (three) times daily.    . TRESIBA FLEXTOUCH 200 UNIT/ML FlexTouch Pen INJECT 120 UNITS INTO THE SKIN AT BEDTIME. 54 mL 0     Social History   Socioeconomic History  . Marital status: Married    Spouse name: Not on file  . Number of children: Not on file  . Years of education: Not on file  . Highest education level: Not on file  Occupational History  . Not on file  Tobacco Use  . Smoking status: Former Smoker    Packs/day: 0.25    Years: 15.00    Pack years: 3.75    Types: Cigarettes    Quit date: 07/08/1987    Years since quitting: 33.1  . Smokeless tobacco: Never Used  . Tobacco comment: "30 years ago"  Vaping Use  . Vaping Use: Never used  Substance and Sexual Activity  . Alcohol use: Yes    Comment: "very seldom"  . Drug use: No  . Sexual activity: Yes    Birth control/protection: None  Other Topics Concern  . Not on file  Social History Narrative  . Not on file   Social Determinants of Health   Financial Resource Strain:   . Difficulty of Paying Living Expenses: Not on file  Food Insecurity:   . Worried About Charity fundraiser in the Last Year: Not on file  . Ran Out of Food in the Last Year: Not on file  Transportation Needs:   . Lack of Transportation (Medical): Not on file  . Lack of Transportation (Non-Medical): Not on file  Physical Activity:   . Days of Exercise per Week: Not on file  . Minutes of Exercise per Session: Not on file  Stress:   . Feeling of Stress : Not on file  Social Connections:   . Frequency of Communication with Friends and Family: Not on file  . Frequency of Social Gatherings with Friends and Family: Not on file  . Attends Religious Services: Not on file  . Active Member of Clubs or Organizations: Not on file  . Attends Archivist Meetings: Not on file  . Marital Status: Not on file  Intimate Partner Violence:   . Fear of Current or Ex-Partner: Not on file  . Emotionally Abused: Not on file  . Physically Abused: Not on file  . Sexually Abused: Not on file   Family History  Problem Relation Age of Onset  . Colon cancer Mother 33  .  Cancer Father        Unsure of what type; not colon CA    OBJECTIVE:  Vitals:   09/04/20 0824 09/04/20 0826  BP:  (!) 156/87  Pulse:  84  Resp:  18  Temp:  98 F (36.7 C)  TempSrc:  Oral  SpO2:  97%  Weight: 253 lb 8.5 oz (115 kg)   Height: 6\' 1"  (1.854 m)     Physical Exam Vitals  and nursing note reviewed.  Constitutional:      General: He is not in acute distress.    Appearance: Normal appearance. He is normal weight. He is not ill-appearing, toxic-appearing or diaphoretic.  Cardiovascular:     Rate and Rhythm: Normal rate and regular rhythm.     Pulses: Normal pulses.     Heart sounds: Normal heart sounds. No murmur heard.  No friction rub. No gallop.   Pulmonary:     Effort: Pulmonary effort is normal. No respiratory distress.     Breath sounds: Normal breath sounds. No stridor. No wheezing, rhonchi or rales.  Chest:     Chest wall: No tenderness.  Skin:    General: Skin is warm.     Capillary Refill: Capillary refill takes less than 2 seconds.     Findings: Wound present.     Comments: Incision present at the right hip, was approximated with staples, currently with blood drainage.  Neurological:     Mental Status: He is alert and oriented to person, place, and time.     LABS:  No results found for this or any previous visit (from the past 24 hour(s)).   ASSESSMENT & PLAN:  1. Bleeding from wound     No orders of the defined types were placed in this encounter.  Patient is stable at discharge.  I have spoke with [facility] on call.  Advised to continue aspirin regiment and continue to do dressing change if pad is saturated.  There is no sign infection at this time  Discharge Instructions  Per [facility] continue to change dressing if pad is saturated Continue to take aspirin 81 mg as prescribed Follow-up with [facility] on 09/08/2020 Return or go to ED if you develop any sign of infection or any worsening of symptoms  Reviewed expectations re: course  of current medical issues. Questions answered. Outlined signs and symptoms indicating need for more acute intervention. Patient verbalized understanding. After Visit Summary given.         Emerson Monte, [place] 09/04/20 [phone]

## 2020-09-04 NOTE — Discharge Instructions (Addendum)
Per [facility] continue to change dressing if pad is saturated Continue to take aspirin 81 mg as prescribed Follow-up with [facility] on 09/08/2020 Return or go to ED if you develop any sign of infection or any worsening of symptoms

## 2020-09-04 NOTE — ED Triage Notes (Signed)
Pt had a RT total hip replacement recently. Around one of the staples pt is having some bloody drainage.

## 2020-09-08 DIAGNOSIS — Z96641 Presence of right artificial hip joint: Secondary | ICD-10-CM | POA: Diagnosis not present

## 2020-09-08 DIAGNOSIS — M25551 Pain in right hip: Secondary | ICD-10-CM | POA: Diagnosis not present

## 2020-09-08 DIAGNOSIS — Z471 Aftercare following joint replacement surgery: Secondary | ICD-10-CM | POA: Diagnosis not present

## 2020-09-15 DIAGNOSIS — E1129 Type 2 diabetes mellitus with other diabetic kidney complication: Secondary | ICD-10-CM | POA: Diagnosis not present

## 2020-09-15 DIAGNOSIS — R809 Proteinuria, unspecified: Secondary | ICD-10-CM | POA: Diagnosis not present

## 2020-09-15 DIAGNOSIS — E1122 Type 2 diabetes mellitus with diabetic chronic kidney disease: Secondary | ICD-10-CM | POA: Diagnosis not present

## 2020-09-15 DIAGNOSIS — N189 Chronic kidney disease, unspecified: Secondary | ICD-10-CM | POA: Diagnosis not present

## 2020-09-17 DIAGNOSIS — Z794 Long term (current) use of insulin: Secondary | ICD-10-CM | POA: Diagnosis not present

## 2020-09-17 DIAGNOSIS — E1165 Type 2 diabetes mellitus with hyperglycemia: Secondary | ICD-10-CM | POA: Diagnosis not present

## 2020-09-21 DIAGNOSIS — R809 Proteinuria, unspecified: Secondary | ICD-10-CM | POA: Diagnosis not present

## 2020-09-21 DIAGNOSIS — D638 Anemia in other chronic diseases classified elsewhere: Secondary | ICD-10-CM | POA: Diagnosis not present

## 2020-09-21 DIAGNOSIS — N189 Chronic kidney disease, unspecified: Secondary | ICD-10-CM | POA: Diagnosis not present

## 2020-09-21 DIAGNOSIS — I129 Hypertensive chronic kidney disease with stage 1 through stage 4 chronic kidney disease, or unspecified chronic kidney disease: Secondary | ICD-10-CM | POA: Diagnosis not present

## 2020-09-21 DIAGNOSIS — D500 Iron deficiency anemia secondary to blood loss (chronic): Secondary | ICD-10-CM | POA: Diagnosis not present

## 2020-09-24 DIAGNOSIS — E7849 Other hyperlipidemia: Secondary | ICD-10-CM | POA: Diagnosis not present

## 2020-09-24 DIAGNOSIS — E1122 Type 2 diabetes mellitus with diabetic chronic kidney disease: Secondary | ICD-10-CM | POA: Diagnosis not present

## 2020-09-24 DIAGNOSIS — E1140 Type 2 diabetes mellitus with diabetic neuropathy, unspecified: Secondary | ICD-10-CM | POA: Diagnosis not present

## 2020-09-24 DIAGNOSIS — I129 Hypertensive chronic kidney disease with stage 1 through stage 4 chronic kidney disease, or unspecified chronic kidney disease: Secondary | ICD-10-CM | POA: Diagnosis not present

## 2020-10-04 DIAGNOSIS — N1831 Chronic kidney disease, stage 3a: Secondary | ICD-10-CM | POA: Diagnosis not present

## 2020-10-04 DIAGNOSIS — Z794 Long term (current) use of insulin: Secondary | ICD-10-CM | POA: Diagnosis not present

## 2020-10-04 DIAGNOSIS — E1122 Type 2 diabetes mellitus with diabetic chronic kidney disease: Secondary | ICD-10-CM | POA: Diagnosis not present

## 2020-10-18 DIAGNOSIS — Z794 Long term (current) use of insulin: Secondary | ICD-10-CM | POA: Diagnosis not present

## 2020-10-18 DIAGNOSIS — E1165 Type 2 diabetes mellitus with hyperglycemia: Secondary | ICD-10-CM | POA: Diagnosis not present

## 2020-10-25 DIAGNOSIS — E7849 Other hyperlipidemia: Secondary | ICD-10-CM | POA: Diagnosis not present

## 2020-10-25 DIAGNOSIS — E1140 Type 2 diabetes mellitus with diabetic neuropathy, unspecified: Secondary | ICD-10-CM | POA: Diagnosis not present

## 2020-10-25 DIAGNOSIS — E1122 Type 2 diabetes mellitus with diabetic chronic kidney disease: Secondary | ICD-10-CM | POA: Diagnosis not present

## 2020-10-25 DIAGNOSIS — I129 Hypertensive chronic kidney disease with stage 1 through stage 4 chronic kidney disease, or unspecified chronic kidney disease: Secondary | ICD-10-CM | POA: Diagnosis not present

## 2020-11-04 IMAGING — XA DG MYELOGRAPHY LUMBAR INJ LUMBOSACRAL
12 of 18 series · 12 of 18 positions shown · non-contrast
Comparison: Lumbar spine x-rays dated June 08, 2016. CT lumbar
myelogram dated December 19, 2007.

CLINICAL DATA: Chronic severe right-sided low back pain. Occasional
right groin and leg pain with heavy lifting.
TECHNIQUE: Contiguous axial images were obtained through the Lumbar spine after
the intrathecal infusion of contrast. Coronal and sagittal
reconstructions were obtained of the axial image sets.

[Series 1: vasc standard · 1 of 1 slices shown (1 of 3)]
[im 1/1]
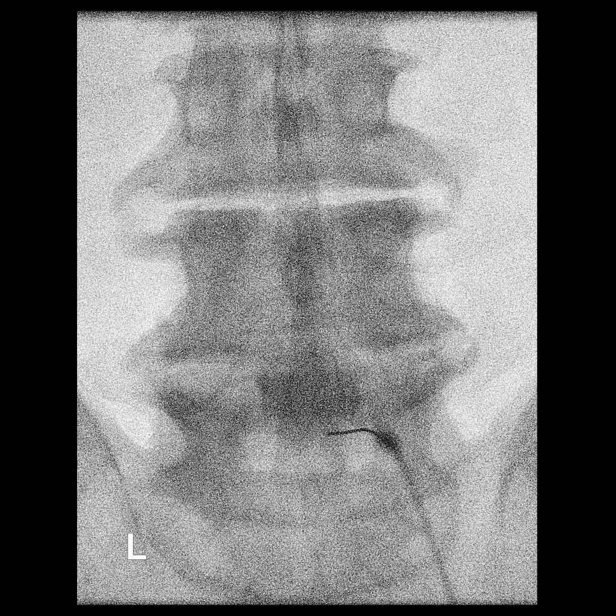

[Series 2: w lumbar spine flexion · 0.15mm/px · 1 of 1 slices shown]
[im 1/1]
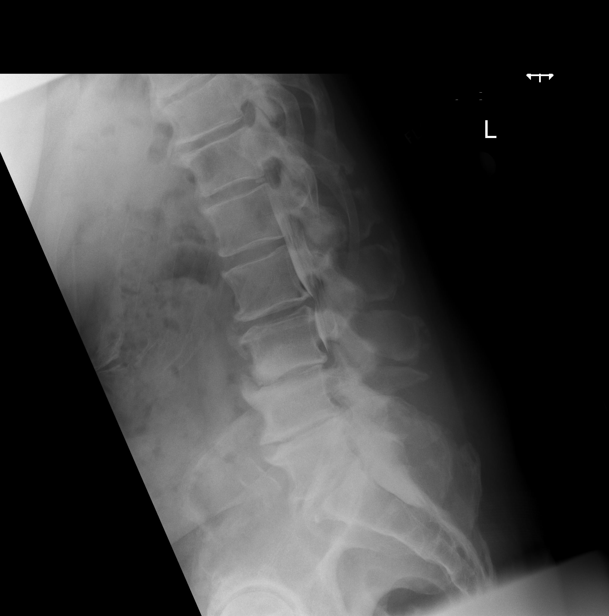

[Series 3: w lumbar spine extension · 0.15mm/px · 1 of 1 slices shown]
[im 1/1]
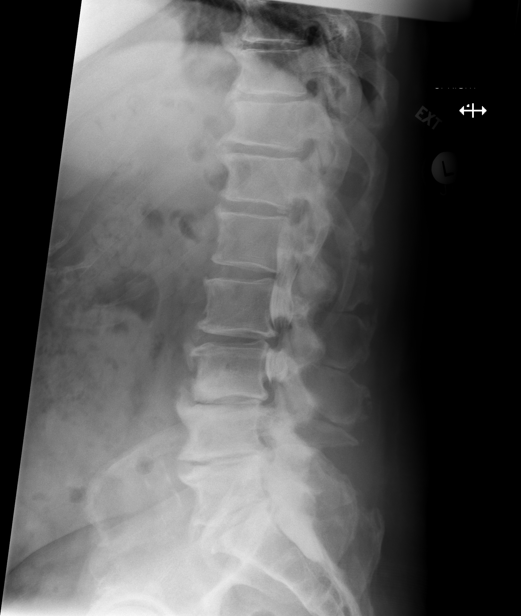

[Series 8: vasc standard · 1 of 1 slices shown (2 of 3)]
[im 1/1]
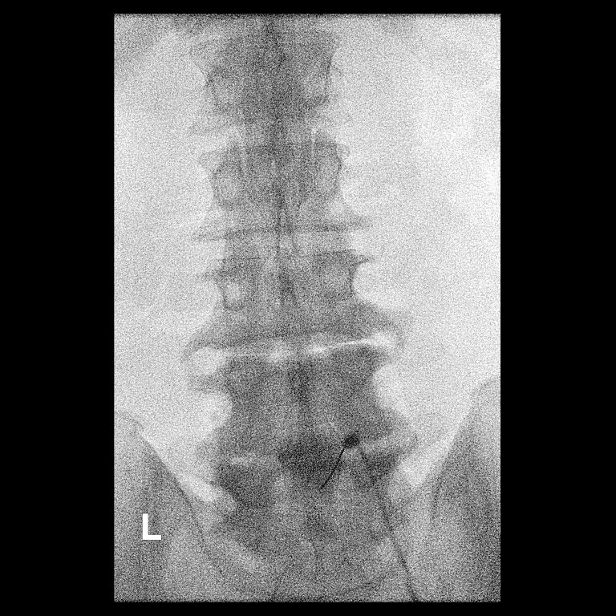

[Series 9: vasc standard · 1 of 1 slices shown (3 of 3)]
[im 1/1]
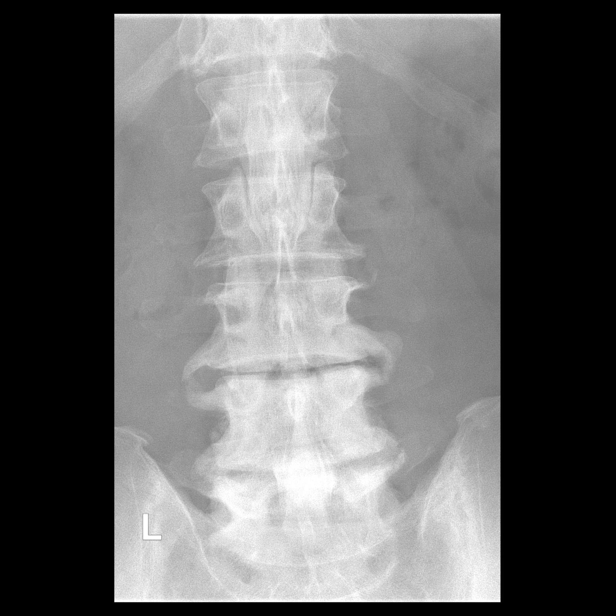

[Series 11: vasc adipose · 1 of 1 slices shown (1 of 7)]
[im 1/1]
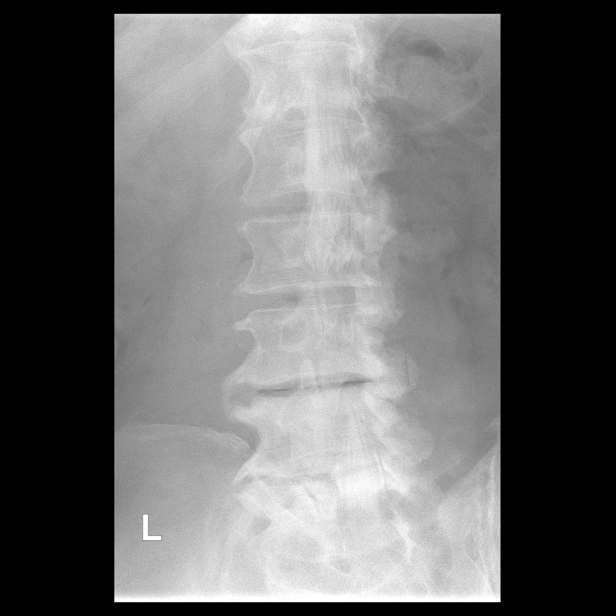

[Series 12: vasc adipose · 1 of 1 slices shown (2 of 7)]
[im 1/1]
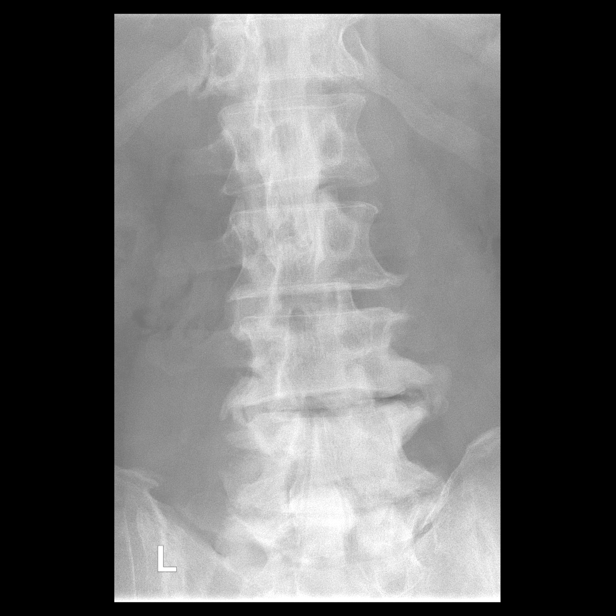

[Series 14: vasc adipose · 1 of 1 slices shown (3 of 7)]
[im 1/1]
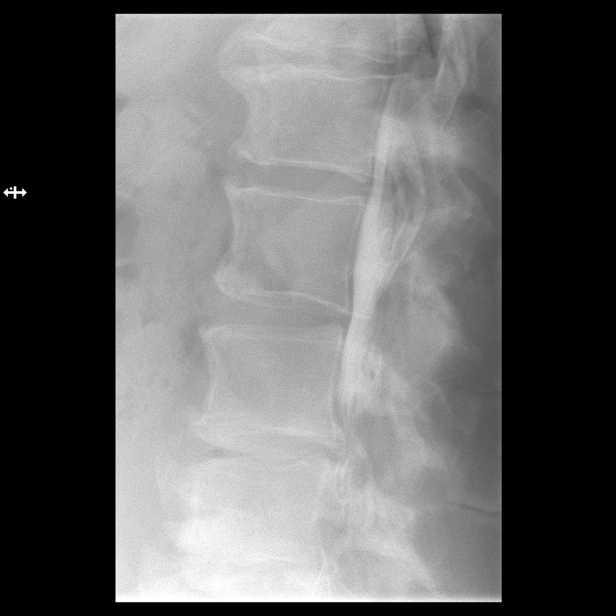

[Series 15: vasc adipose · 1 of 1 slices shown (4 of 7)]
[im 1/1]
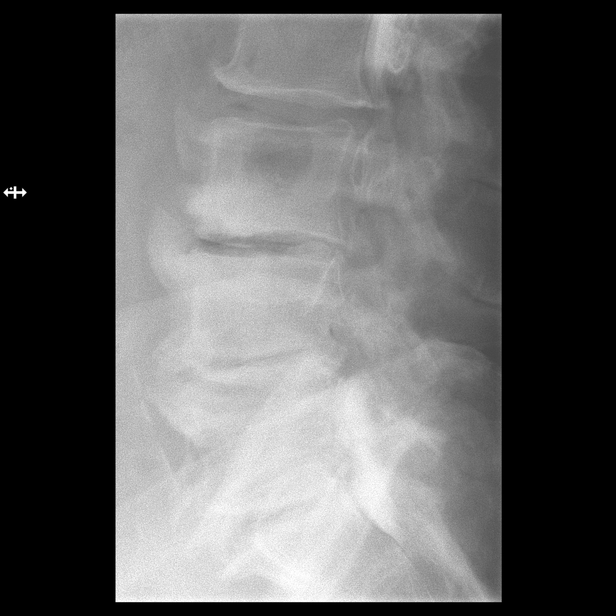

[Series 17: vasc adipose · 1 of 1 slices shown (5 of 7)]
[im 1/1]
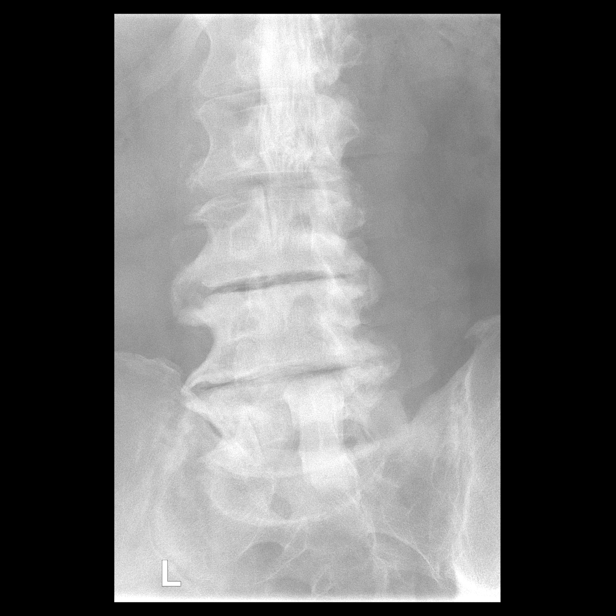

[Series 18: vasc adipose · 1 of 1 slices shown (6 of 7)]
[im 1/1]
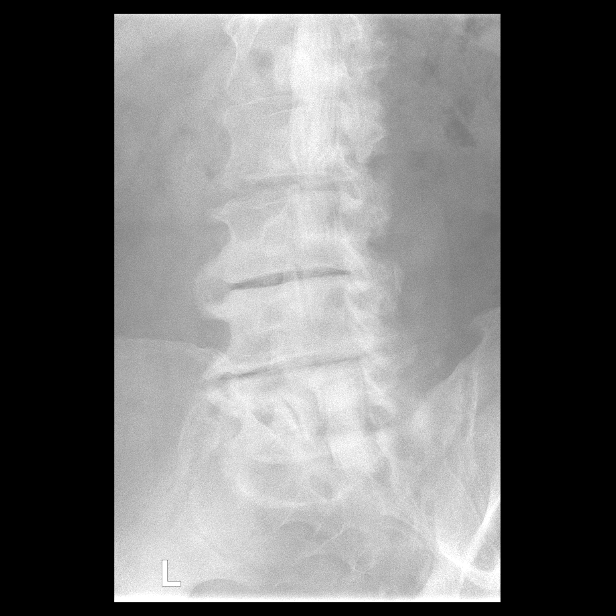

[Series 20: vasc adipose · 1 of 1 slices shown (7 of 7)]
[im 1/1]
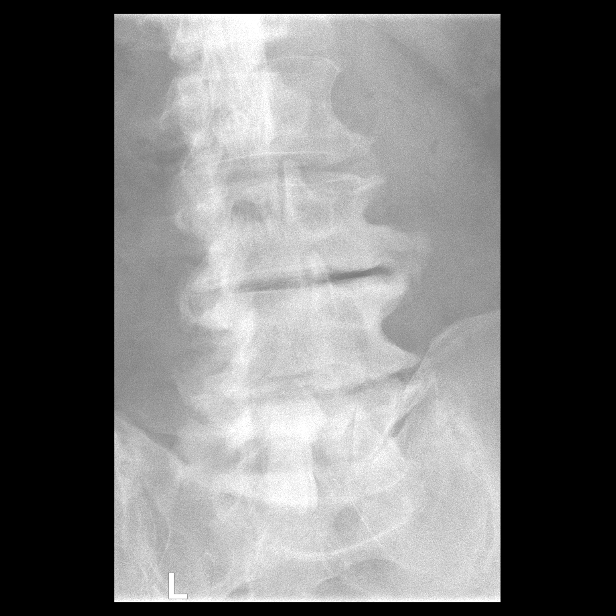

[12 of 18 positions shown; findings below may reference images not displayed]

EXAM:
LUMBAR MYELOGRAM

CT LUMBAR MYELOGRAM

FLUOROSCOPY TIME:  Radiation Exposure Index (as provided by the
fluoroscopic device): 17.1 mGy

Fluoroscopy Time:  22 seconds

Number of Acquired Images:  15

PROCEDURE:
After thorough discussion of risks and benefits of the procedure
including bleeding, infection, injury to nerves, blood vessels,
adjacent structures as well as headache and CSF leak, written and
oral informed consent was obtained. Consent was obtained by Dr.
Harlamp Jionny. Time out form was completed.

Patient was positioned prone on the fluoroscopy table. Local
anesthesia was provided with 1% lidocaine without epinephrine after
prepped and draped in the usual sterile fashion. Puncture was
performed at L5-S1 using a 5 inch 22-gauge spinal needle via right
paramedian approach. Using a single pass through the dura, the
needle was placed within the thecal sac, with return of clear CSF.
15 mL of Isovue D-O33 was injected into the thecal sac, with normal
opacification of the nerve roots and cauda equina consistent with
free flow within the subarachnoid space.

I personally performed the lumbar puncture and administered the
intrathecal contrast. I also personally supervised acquisition of
the myelogram images.
FINDINGS: LUMBAR MYELOGRAM FINDINGS:

There is straightening of the normal lumbar lordosis. There is 1 mm
retrolisthesis at L2-L3 and 3 mm retrolisthesis at L3-L4. No dynamic
instability. Progressive now severe disc height loss at L3-L4 when
compared to 7650. Unchanged severe disc height loss at L4-L5. Severe
spinal canal stenosis at L2-L3 and L3-L4. No significant change with
standing, flexion, or extension.

CT LUMBAR MYELOGRAM FINDINGS:

Segmentation: 5 lumbar type vertebral bodies.

Alignment: 3 mm retrolisthesis at L3-L4.

Vertebrae: No acute fracture or focal pathologic process.

Conus medullaris and cauda equina: Conus extends to the L1-L2 level.
Conus appears normal. There is clumping of the proximal cauda equina
nerve roots due to severe downstream stenosis.

Paraspinal and other soft tissues: Aortoiliac atherosclerotic
vascular disease. Degenerative changes of the bilateral sacroiliac
joints with bridging osteophytes anteriorly.

Disc levels:

T12-L1:  Mild bilateral facet arthropathy.  No stenosis.

L1-L2:  Mild bilateral facet arthropathy.  No stenosis.

L2-L3: Progressive no large diffuse disc bulge with unchanged mild
bilateral facet arthropathy. New severe central spinal canal
stenosis. Unchanged mild right neuroforaminal stenosis. No left
neuroforaminal stenosis.

L3-L4: Large diffuse disc bulge. Endplate spurring. Progressive
mild-to-moderate bilateral facet arthropathy. New severe central
spinal canal stenosis. Unchanged moderate left neuroforaminal
stenosis. Progressive now moderate right neuroforaminal stenosis.

L4-L5: Progressive moderate diffuse disc bulge. Endplate spurring.
Unchanged moderate bilateral facet arthropathy. Unchanged mild
central spinal canal stenosis. Progressive moderate right and
unchanged mild left lateral recess stenosis. Unchanged moderate
bilateral neuroforaminal stenosis.

L5-S1: Progressive mild diffuse disc bulge and endplate spurring.
Unchanged mild bilateral facet arthropathy. New mild bilateral
neuroforaminal stenosis. No spinal canal stenosis.
IMPRESSION: LUMBAR MYELOGRAM IMPRESSION:

1. New severe spinal canal stenosis at L2-L3 and L3-L4, unchanged
with standing, flexion, extension.
2. 3 mm retrolisthesis at L3-L4.  No dynamic instability.

CT LUMBAR MYELOGRAM IMPRESSION:

1. Progressive multilevel degenerative changes of the lumbar spine
with new severe spinal canal stenosis at L2-L3 and L3-L4.
2. Moderate bilateral neuroforaminal stenosis at L3-L4, progressed
on the right.
3. Unchanged moderate bilateral neuroforaminal stenosis at L4-L5.
4. New mild bilateral neuroforaminal stenosis at L5-S1.

## 2020-11-17 DIAGNOSIS — G894 Chronic pain syndrome: Secondary | ICD-10-CM | POA: Diagnosis not present

## 2020-11-17 DIAGNOSIS — Z23 Encounter for immunization: Secondary | ICD-10-CM | POA: Diagnosis not present

## 2020-11-23 DIAGNOSIS — D500 Iron deficiency anemia secondary to blood loss (chronic): Secondary | ICD-10-CM | POA: Diagnosis not present

## 2020-11-23 DIAGNOSIS — E1129 Type 2 diabetes mellitus with other diabetic kidney complication: Secondary | ICD-10-CM | POA: Diagnosis not present

## 2020-11-23 DIAGNOSIS — D529 Folate deficiency anemia, unspecified: Secondary | ICD-10-CM | POA: Diagnosis not present

## 2020-11-23 DIAGNOSIS — E1122 Type 2 diabetes mellitus with diabetic chronic kidney disease: Secondary | ICD-10-CM | POA: Diagnosis not present

## 2020-11-23 DIAGNOSIS — N189 Chronic kidney disease, unspecified: Secondary | ICD-10-CM | POA: Diagnosis not present

## 2020-11-25 DIAGNOSIS — I129 Hypertensive chronic kidney disease with stage 1 through stage 4 chronic kidney disease, or unspecified chronic kidney disease: Secondary | ICD-10-CM | POA: Diagnosis not present

## 2020-11-25 DIAGNOSIS — E1122 Type 2 diabetes mellitus with diabetic chronic kidney disease: Secondary | ICD-10-CM | POA: Diagnosis not present

## 2020-11-25 DIAGNOSIS — E1140 Type 2 diabetes mellitus with diabetic neuropathy, unspecified: Secondary | ICD-10-CM | POA: Diagnosis not present

## 2020-11-25 DIAGNOSIS — E7849 Other hyperlipidemia: Secondary | ICD-10-CM | POA: Diagnosis not present

## 2020-11-29 DIAGNOSIS — N189 Chronic kidney disease, unspecified: Secondary | ICD-10-CM | POA: Diagnosis not present

## 2020-11-29 DIAGNOSIS — E611 Iron deficiency: Secondary | ICD-10-CM | POA: Diagnosis not present

## 2020-11-29 DIAGNOSIS — E1122 Type 2 diabetes mellitus with diabetic chronic kidney disease: Secondary | ICD-10-CM | POA: Diagnosis not present

## 2020-11-29 DIAGNOSIS — I129 Hypertensive chronic kidney disease with stage 1 through stage 4 chronic kidney disease, or unspecified chronic kidney disease: Secondary | ICD-10-CM | POA: Diagnosis not present

## 2020-11-29 DIAGNOSIS — E1129 Type 2 diabetes mellitus with other diabetic kidney complication: Secondary | ICD-10-CM | POA: Diagnosis not present

## 2020-11-29 DIAGNOSIS — R809 Proteinuria, unspecified: Secondary | ICD-10-CM | POA: Diagnosis not present

## 2020-12-01 ENCOUNTER — Encounter: Payer: Self-pay | Admitting: Internal Medicine

## 2020-12-08 DIAGNOSIS — M1712 Unilateral primary osteoarthritis, left knee: Secondary | ICD-10-CM | POA: Diagnosis not present

## 2020-12-08 DIAGNOSIS — C9211 Chronic myeloid leukemia, BCR/ABL-positive, in remission: Secondary | ICD-10-CM | POA: Diagnosis not present

## 2020-12-08 DIAGNOSIS — C9210 Chronic myeloid leukemia, BCR/ABL-positive, not having achieved remission: Secondary | ICD-10-CM | POA: Diagnosis not present

## 2020-12-08 DIAGNOSIS — E611 Iron deficiency: Secondary | ICD-10-CM | POA: Diagnosis not present

## 2020-12-08 DIAGNOSIS — Z96641 Presence of right artificial hip joint: Secondary | ICD-10-CM | POA: Diagnosis not present

## 2020-12-08 DIAGNOSIS — N182 Chronic kidney disease, stage 2 (mild): Secondary | ICD-10-CM | POA: Diagnosis not present

## 2020-12-14 DIAGNOSIS — C9210 Chronic myeloid leukemia, BCR/ABL-positive, not having achieved remission: Secondary | ICD-10-CM | POA: Diagnosis not present

## 2020-12-22 ENCOUNTER — Other Ambulatory Visit: Payer: Self-pay | Admitting: "Endocrinology

## 2020-12-26 DIAGNOSIS — E1122 Type 2 diabetes mellitus with diabetic chronic kidney disease: Secondary | ICD-10-CM | POA: Diagnosis not present

## 2020-12-26 DIAGNOSIS — I129 Hypertensive chronic kidney disease with stage 1 through stage 4 chronic kidney disease, or unspecified chronic kidney disease: Secondary | ICD-10-CM | POA: Diagnosis not present

## 2020-12-26 DIAGNOSIS — E1140 Type 2 diabetes mellitus with diabetic neuropathy, unspecified: Secondary | ICD-10-CM | POA: Diagnosis not present

## 2020-12-26 DIAGNOSIS — E7849 Other hyperlipidemia: Secondary | ICD-10-CM | POA: Diagnosis not present

## 2020-12-30 ENCOUNTER — Other Ambulatory Visit: Payer: Self-pay

## 2020-12-30 ENCOUNTER — Ambulatory Visit: Payer: Medicare Other | Admitting: Internal Medicine

## 2020-12-30 ENCOUNTER — Encounter: Payer: Self-pay | Admitting: Internal Medicine

## 2020-12-30 VITALS — BP 130/79 | HR 87 | Temp 96.9°F | Ht 72.0 in | Wt 247.6 lb

## 2020-12-30 DIAGNOSIS — Z800 Family history of malignant neoplasm of digestive organs: Secondary | ICD-10-CM | POA: Diagnosis not present

## 2020-12-30 DIAGNOSIS — D539 Nutritional anemia, unspecified: Secondary | ICD-10-CM

## 2020-12-30 DIAGNOSIS — Z86010 Personal history of colonic polyps: Secondary | ICD-10-CM | POA: Diagnosis not present

## 2020-12-30 NOTE — Progress Notes (Signed)
Primary Care Physician:  Sharilyn Sites, MD Primary Gastroenterologist:  Dylan Spears is a 65 year-old gentleman with chronic kidney disease, hypertension, history of CML in remission referred for iron deficiency anemia by Dr. Theador Spears.  Lab work in 2021 reviewed.  Back on December 29 iron studies were performed.  Serum iron 56, TIBC 305 with iron saturation 18%-a little low.  Back in April, iron saturation 23, in January 2021 serum iron 63, iron binding capacity 306 and saturation 21%.  Of note, ferritin more recently was 48 down from 73 but still in the normal range.  Dylan Spears was started.  His hemoglobin was 14.7 on 12/29. Patient stools are now dark on iron.  Has not had any dark stools before iron therapy.  Denies Hematemesis, hematochezia or melena.  No change in bowel habits.  No abdominal pain or upper GI tract symptoms such as odynophagia, dysphagia, nausea or vomiting.   No NSAIDs other than a baby aspirin daily  He had 2 small adenomas removed from his colon in 2019; he is due for a surveillance examination 2024.  Family history significant for mother had colon cancer.  Patient is followed closely at [facility] for a history of CML which is in remission.  He was seen there just a couple of weeks ago.  He was felt to be doing well at that time and per patient report they told him he was not iron deficient.  They recommended no further evaluation.  Pre-Procedure History & Physical: HPI:  Dylan Spears. is a 65 y.o. male here for   Past Medical History:  Diagnosis Date  . Chronic back pain   . CML (chronic myelocytic leukemia) ([place])   . Diabetes mellitus without complication ([place])   . History of stress test    myoview study suggested the possibility of basal inferior wall ischemia which was present in all 3 tonographic views.  [name] Hx of echocardiogram 05/2012   EF 55%Mildly hypertrophic left ventricle with normal systolic function and early signs of  mild diastolic dysfuntion. Moderately dilated left atrium, mild aortic valve sclerosis with out stenosis.  . Hyperlipemia   . Hypertension   . Leukemia [facility])     Past Surgical History:  Procedure Laterality Date  . BACK SURGERY  12/2006  . CARDIAC CATHETERIZATION  2010   revealed mild nonobstructive CAD  . CARDIAC CATHETERIZATION  07/2012   This showed essentially normal coronary arteries without obstruction. there was normal LV function.  . COLONOSCOPY WITH PROPOFOL N/A 07/14/2018   Procedure: COLONOSCOPY WITH PROPOFOL;  Surgeon: Dylan Dolin, MD;  Location: [facility];  Service: Endoscopy;  Laterality: N/A;  10:45am  . KNEE SURGERY Left 01/1994  . LEFT HEART CATHETERIZATION WITH CORONARY ANGIOGRAM N/A 07/29/2012   Procedure: LEFT HEART CATHETERIZATION WITH CORONARY ANGIOGRAM;  Surgeon: Troy Sine, MD;  Location: [facility] CATH LAB;  Service: Cardiovascular;  Laterality: N/A;  . POLYPECTOMY  07/14/2018   Procedure: POLYPECTOMY;  Surgeon: Dylan Dolin, MD;  Location: [facility];  Service: Endoscopy;;  colon  . TOTAL HIP ARTHROPLASTY Right     Prior to Admission medications   Medication Sig Start Date End Date Taking? Authorizing Provider  ACCU-CHEK AVIVA PLUS test strip USE TO TEST 4 TIMES DAILY. 06/23/20  Yes Nida, Marella Chimes, MD  Accu-Chek Softclix Lancets lancets USE TO TEST 4 TIMES DAILY. 04/14/20  Yes Cassandria Anger, MD  amLODipine (NORVASC) 10 MG tablet Take 10 mg by mouth  daily.   Yes [provider]  aspirin EC 81 MG tablet Take 81 mg by mouth daily.   Yes [provider]  BYSTOLIC 10 MG tablet TAKE ONE TABLET BY MOUTH EVERY DAY. Patient taking differently: Take 10 mg by mouth daily. 03/17/19  Yes Troy Sine, MD  Cholecalciferol (VITAMIN D-3) 25 MCG (1000 UT) CAPS Take by mouth daily.   Yes [provider]  empagliflozin (JARDIANCE) 10 MG TABS tablet Take by mouth daily.   Yes [provider]  ferrous sulfate (FEROSUL)  325 (65 FE) MG tablet Take 325 mg by mouth 2 (two) times daily.   Yes [provider]  insulin lispro (HUMALOG) 100 UNIT/ML KwikPen INJECT 20-26 UNITS INTO THE SKIN 3 TIMES DAILY BEFORE MEALS. Patient taking differently: 10 Units 3 (three) times daily. 05/20/20  Yes Nida, Marella Chimes, MD  Insulin Pen Needle (SURE COMFORT PEN NEEDLES) 31G X 8 MM MISC USING WITH INSULIN INJECTIONS 4X DAILY. 05/02/20  Yes Nida, Marella Chimes, MD  Multiple Vitamin (MULTIVITAMIN WITH MINERALS) TABS Take 1 tablet by mouth daily.   Yes [provider]  olmesartan (BENICAR) 40 MG tablet Take 40 mg by mouth daily.   Yes [provider]  Oxycodone HCl 10 MG TABS Take 10 mg by mouth daily as needed (for pain.).  10/04/16  Yes [provider]  OZEMPIC, 0.25 OR 0.5 MG/DOSE, 2 MG/1.5ML SOPN Inject into the skin once a week. Takes 1 unit weekly. 06/29/20  Yes [provider]  rosuvastatin (CRESTOR) 40 MG tablet Take 1 tablet (40 mg total) by mouth daily. 12/22/19  Yes Troy Sine, MD  sodium bicarbonate 650 MG tablet Take 650 mg by mouth 3 (three) times daily. 03/18/20  Yes [provider]  TRESIBA FLEXTOUCH 200 UNIT/ML FlexTouch Pen INJECT 120 UNITS INTO THE SKIN AT BEDTIME. Patient taking differently: Inject 70 Units into the skin at bedtime. 08/15/20  Yes Cassandria Anger, MD  Cholecalciferol (VITAMIN D3) 1000000 UNIT/GM LIQD by Does not apply route. Patient not taking: Reported on 12/30/2020    [provider]    Allergies as of 12/30/2020  . (No Known Allergies)    Family History  Problem Relation Age of Onset  . Colon cancer Mother 64  . Cancer Father        Unsure of what type; not colon CA    Social History   Socioeconomic History  . Marital status: Married    Spouse name: Not on file  . Number of children: Not on file  . Years of education: Not on file  . Highest education level: Not on file  Occupational History  . Not on file  Tobacco  Use  . Smoking status: Former Smoker    Packs/day: 0.25    Years: 15.00    Pack years: 3.75    Types: Cigarettes    Quit date: 07/08/1987    Years since quitting: 33.5  . Smokeless tobacco: Never Used  . Tobacco comment: "30 years ago"  Vaping Use  . Vaping Use: Never used  Substance and Sexual Activity  . Alcohol use: Yes    Comment: "very seldom"  . Drug use: No  . Sexual activity: Yes    Birth control/protection: None  Other Topics Concern  . Not on file  Social History Narrative  . Not on file   Social Determinants of Health   Financial Resource Strain: Not on file  Food Insecurity: Not on file  Transportation Needs:  Not on file  Physical Activity: Not on file  Stress: Not on file  Social Connections: Not on file  Intimate Partner Violence: Not on file    Review of Systems: See HPI, otherwise negative ROS  Physical Exam: BP 130/79   Pulse 87   Temp (!) 96.9 F (36.1 C) (Temporal)   Ht 6' (1.829 m)   Wt 247 lb 9.6 oz (112.3 kg)   BMI 33.58 kg/m  General:   Alert,, pleasant and cooperative in NAD Skin:  Intact without significant lesions or rashes. Neck:  Supple; no masses or thyromegaly. No significant cervical adenopathy. Lungs:  Clear throughout to auscultation.   No wheezes, crackles, or rhonchi. No acute distress. Heart:  Regular rate and rhythm; no murmurs, clicks, rubs,  or gallops. Abdomen: Non-distended, normal bowel sounds.  Soft and nontender without appreciable mass or hepatosplenomegaly.  Pulses:  Normal pulses noted. Extremities:  Without clubbing or edema.  Impression/Plan: Very very pleasant 65 year old gentleman history of CML fortunately in remission, positive family history of colon cancer in a first-degree relative and a personal history of colonic polyps now referred for iron deficiency.  Reviewing lab studies he does have a subtle relative downward trend iron saturation and ferritin.  Not very impressive at this time.  He is devoid of  any new or alarming GI symptoms.  Patient certainly needs a colonoscopy in 2024.  It is unclear whether or not that schedule needs to be moved up.  We need to determine whether or not he has any evidence of occult bleeding.  Fluctuation in iron stores may be multifactorial in origin and not necessarily related to GI blood loss.  Recommendations:  As discussed with the patient, we will test for occult blood in stool  Further recommendations to follow once those results are available for review.  I'd like to thank Dr. Theador Spears for allowing me to see this very nice gentleman once again.      Notice: This dictation was prepared with Dragon dictation along with smaller phrase technology. Any transcriptional errors that result from this process are unintentional and may not be corrected upon review.

## 2020-12-30 NOTE — Patient Instructions (Signed)
Your iron studies look pretty good.  Your blood ferritin level is actually normal.  As discussed, lets check your stool for occult blood  At a minimum, you will need to come back for colonoscopy in 2024  Further recommendations to follow once I get the stool blood test back for review

## 2021-01-02 ENCOUNTER — Ambulatory Visit (INDEPENDENT_AMBULATORY_CARE_PROVIDER_SITE_OTHER): Payer: Self-pay | Admitting: Internal Medicine

## 2021-01-02 ENCOUNTER — Other Ambulatory Visit: Payer: Self-pay

## 2021-01-02 ENCOUNTER — Encounter: Payer: Self-pay | Admitting: Internal Medicine

## 2021-01-02 DIAGNOSIS — D539 Nutritional anemia, unspecified: Secondary | ICD-10-CM

## 2021-01-02 LAB — IFOBT (OCCULT BLOOD): IFOBT: NEGATIVE

## 2021-01-05 ENCOUNTER — Other Ambulatory Visit: Payer: Self-pay

## 2021-01-05 ENCOUNTER — Encounter: Payer: Self-pay | Admitting: Cardiovascular Disease

## 2021-01-05 ENCOUNTER — Ambulatory Visit: Payer: Medicare Other | Admitting: Cardiovascular Disease

## 2021-01-05 VITALS — BP 130/76 | HR 88 | Ht 73.0 in | Wt 247.6 lb

## 2021-01-05 DIAGNOSIS — E785 Hyperlipidemia, unspecified: Secondary | ICD-10-CM | POA: Diagnosis not present

## 2021-01-05 DIAGNOSIS — E119 Type 2 diabetes mellitus without complications: Secondary | ICD-10-CM | POA: Diagnosis not present

## 2021-01-05 DIAGNOSIS — I10 Essential (primary) hypertension: Secondary | ICD-10-CM | POA: Diagnosis not present

## 2021-01-05 DIAGNOSIS — E669 Obesity, unspecified: Secondary | ICD-10-CM

## 2021-01-05 DIAGNOSIS — N289 Disorder of kidney and ureter, unspecified: Secondary | ICD-10-CM | POA: Diagnosis not present

## 2021-01-05 DIAGNOSIS — C9210 Chronic myeloid leukemia, BCR/ABL-positive, not having achieved remission: Secondary | ICD-10-CM

## 2021-01-05 NOTE — Patient Instructions (Signed)
Medication Instructions:  NONE   *If you need a refill on your cardiac medications before your next appointment, please call your pharmacy*  Lab Work: NONE   Testing/Procedures: NONE  Follow-Up: At [facility], you and your health needs are our priority.  As part of our continuing mission to provide you with exceptional heart care, we have created designated Provider Care Teams.  These Care Teams include your primary Cardiologist (physician) and Advanced Practice Providers (APPs -  Physician Assistants and Nurse Practitioners) who all work together to provide you with the care you need, when you need it.  We recommend signing up for the patient portal called "MyChart".  Sign up information is provided on this After Visit Summary.  MyChart is used to connect with patients for Virtual Visits (Telemedicine).  Patients are able to view lab/test results, encounter notes, upcoming appointments, etc.  Non-urgent messages can be sent to your provider as well.   To learn more about what you can do with MyChart, go to [email or web address].    Your next appointment:   12 month(s)  The format for your next appointment:   In Person  Provider:   DR Claiborne Billings

## 2021-01-05 NOTE — Progress Notes (Signed)
Patient ID: Isidore Margraf., male   DOB: Dec 24, 1955, 65 y.o.   MRN: [id number]     PCP:  Dr. Hilma Favors   HPI: Egon Dittus., is a 65 y.o. male who presents to the office today for a 18 month cardiology evaluation.   Mr. Goldner has a history of CML which was originally diagnosed in 2004, as well as a history of hypertension, hyperlipidemia, and type 2 diabetes mellitus. In 2010 he developed nitrate responsive chest pain which led to cardiac catheterization.  Catheterization revealed normal coronary arteries and normal LV function. In 2013 because of recurrent chest pain symptoms and echo Doppler study as well as Myoview scan were done. With exercise Myoview he exercised to a 9 minute workload. There was a question of borderline mild ischemia in the basal inferior wall. Ejection fraction is 54%. An echo Doppler study showed normal systolic and diastolic function. There was a question of pseudo-dyskinesis of the inferior wall due to increased infradiaphragmatic pressure.   He had mild aortic sclerosis.  When saw him in November 2017  he had noted some mild shortness of breath with significant activity.   Dr. Hilma Favors reduce his amlodipine dose and he now is on amlodipine at 5 mg, Bystolic 5 mg and ramipril 5 mg daily for hypertension, and atorvastatin 20 mg for hyperlipidemia.  He is diabetic on metformin and chest CT but flex, touch insulin.  His CML is in remission and he continues to be on Gleevec.    I  saw him in April 2019.  He admitted to mild weight gain and had not been exercising regularly.   He had laboratory in October 2018 by Dr. Hilma Favors which showed a cholesterol 123, HDL 43, LDL 64, triglycerides 79.  Recent hemoglobin A1c in March 2019 was 7.1. Renal function was mildly impaired at 1.49, but this was stable.  With his 15 pound weight gain, I discussed the American Heart Association recommendations of exercising at least 5 days/week for minimum of 30 minutes of moderate intensity.  I  saw him in July 2020 which time he had remained stable from a cardiovascular perspective.  He specifically denied chest pain or shortness of breath.  His exercise has been limited due to bilateral knee discomfort and a bad back.  He has chronic low back pain, ruptured disc, and spinal stenosis.  He sees Dr. Lowanda Foster for his kidney disease and apparently underwent an ultrasound-guided renal core biopsy on May 18, 2019.  He was told that the results were normal..  I last saw him in January 2021.  Over the previous 6 months he had issues with diabetes  controlled.  His hemoglobin A1c had increased to 11 but this significantly improved 8.5 most recently in December 2020, followed by Dr. Dorris Fetch.  He denied chest pain or shortness of breath.  He was no longer on atorvastatin and had been started  on rosuvastatin 20 mg for an LDL of 155.    Since I saw him, he was evaluated in August 2021 by Almyra Deforest, PA for preoperative clearance prior to undergoing  right hip replacement at [facility].  Prior to surgery, his diabetes mellitus was under control he tells me his hemoglobin A1c had decreased to less than seventy before surgery was able to be done.  He tolerated surgery well.  His CML has remained in remission.  He was recently evaluated by GI for mild iron deficiency.  He is scheduled to undergo follow-up colonoscopy in March.  Presently he denies  any chest pain or shortness of breath.  He continues to be on amlodipine 10 mg, Bystolic 10 mg and olmesartan 40 mg for hypertension.  He is now on rosuvastatin 40 mg for hyperlipidemia.  For his diabetes he is on Jardiance 10 mg, Humalog insulin, and Ozempic.  He presents for evaluation.  Past Medical History:  Diagnosis Date  . Chronic back pain   . CML (chronic myelocytic leukemia) ([place])   . Diabetes mellitus without complication ([place])   . History of stress test    myoview study suggested the possibility of basal inferior wall ischemia which was present in all 3 tonographic  views.  [name] Hx of echocardiogram 05/2012   EF 55%Mildly hypertrophic left ventricle with normal systolic function and early signs of mild diastolic dysfuntion. Moderately dilated left atrium, mild aortic valve sclerosis with out stenosis.  . Hyperlipemia   . Hypertension   . Leukemia [facility])     Past Surgical History:  Procedure Laterality Date  . BACK SURGERY  12/2006  . CARDIAC CATHETERIZATION  2010   revealed mild nonobstructive CAD  . CARDIAC CATHETERIZATION  07/2012   This showed essentially normal coronary arteries without obstruction. there was normal LV function.  . COLONOSCOPY WITH PROPOFOL N/A 07/14/2018   Procedure: COLONOSCOPY WITH PROPOFOL;  Surgeon: Daneil Dolin, MD;  Location: [facility];  Service: Endoscopy;  Laterality: N/A;  10:45am  . KNEE SURGERY Left 01/1994  . LEFT HEART CATHETERIZATION WITH CORONARY ANGIOGRAM N/A 07/29/2012   Procedure: LEFT HEART CATHETERIZATION WITH CORONARY ANGIOGRAM;  Surgeon: Troy Sine, MD;  Location: [facility] CATH LAB;  Service: Cardiovascular;  Laterality: N/A;  . POLYPECTOMY  07/14/2018   Procedure: POLYPECTOMY;  Surgeon: Daneil Dolin, MD;  Location: [facility];  Service: Endoscopy;;  colon  . TOTAL HIP ARTHROPLASTY Right     No Known Allergies  Current Outpatient Medications  Medication Sig Dispense Refill  . ACCU-CHEK AVIVA PLUS test strip USE TO TEST 4 TIMES DAILY. 200 strip 3  . Accu-Chek Softclix Lancets lancets USE TO TEST 4 TIMES DAILY. 200 each 3  . amLODipine (NORVASC) 10 MG tablet Take 10 mg by mouth daily.    [name] aspirin EC 81 MG tablet Take 81 mg by mouth daily.    [name] BYSTOLIC 10 MG tablet TAKE ONE TABLET BY MOUTH EVERY DAY. (Patient taking differently: Take 10 mg by mouth daily.) 30 tablet 4  . Cholecalciferol (VITAMIN D-3) 25 MCG (1000 UT) CAPS Take by mouth daily.    . Cholecalciferol (VITAMIN D3) 1000000 UNIT/GM LIQD by Does not apply route.    . empagliflozin (JARDIANCE) 10 MG TABS tablet Take by mouth daily.    .  ferrous sulfate 325 (65 FE) MG tablet Take 325 mg by mouth 2 (two) times daily.    . insulin lispro (HUMALOG) 100 UNIT/ML KwikPen INJECT 20-26 UNITS INTO THE SKIN 3 TIMES DAILY BEFORE MEALS. (Patient taking differently: 10 Units 3 (three) times daily.) 15 mL 0  . Insulin Pen Needle (SURE COMFORT PEN NEEDLES) 31G X 8 MM MISC USING WITH INSULIN INJECTIONS 4X DAILY. 100 each 3  . Multiple Vitamin (MULTIVITAMIN WITH MINERALS) TABS Take 1 tablet by mouth daily.    [name] olmesartan (BENICAR) 40 MG tablet Take 40 mg by mouth daily.    . Oxycodone HCl 10 MG TABS Take 10 mg by mouth daily as needed (for pain.).     [name] OZEMPIC, 0.25 OR 0.5 MG/DOSE, 2 MG/1.5ML SOPN Inject into the skin  once a week. Takes 1 unit weekly.    . rosuvastatin (CRESTOR) 40 MG tablet Take 1 tablet (40 mg total) by mouth daily. 90 tablet 3  . sodium bicarbonate 650 MG tablet Take 650 mg by mouth 3 (three) times daily.    . TRESIBA FLEXTOUCH 200 UNIT/ML FlexTouch Pen INJECT 120 UNITS INTO THE SKIN AT BEDTIME. (Patient taking differently: Inject 70 Units into the skin at bedtime.) 54 mL 0   No current facility-administered medications for this visit.    Social History   Socioeconomic History  . Marital status: Married    Spouse name: Not on file  . Number of children: Not on file  . Years of education: Not on file  . Highest education level: Not on file  Occupational History  . Not on file  Tobacco Use  . Smoking status: Former Smoker    Packs/day: 0.25    Years: 15.00    Pack years: 3.75    Types: Cigarettes    Quit date: 07/08/1987    Years since quitting: 33.5  . Smokeless tobacco: Never Used  . Tobacco comment: "30 years ago"  Vaping Use  . Vaping Use: Never used  Substance and Sexual Activity  . Alcohol use: Yes    Comment: "very seldom"  . Drug use: No  . Sexual activity: Yes    Birth control/protection: None  Other Topics Concern  . Not on file  Social History Narrative  . Not on file   Social Determinants  of Health   Financial Resource Strain: Not on file  Food Insecurity: Not on file  Transportation Needs: Not on file  Physical Activity: Not on file  Stress: Not on file  Social Connections: Not on file  Intimate Partner Violence: Not on file    Family History  Problem Relation Age of Onset  . Colon cancer Mother 39  . Cancer Father        Marena Chancy of what type; not colon CA   Social history is notable in that he is married. He has 2 children. There is no tobacco use. He quit at age 21. He does not routinely exercise. He does note some fatigue. He rarely drinks alcohol.  ROS General: Negative; No fevers, chills, or night sweats;  HEENT: Negative; No changes in vision or hearing, sinus congestion, difficulty swallowing Pulmonary: Negative; No cough, wheezing, shortness of breath, hemoptysis Cardiovascular: Negative; No chest pain, presyncope, syncope, palpitations GI: Negative; No nausea, vomiting, diarrhea, or abdominal pain GU: Mild erectile dysfunction Musculoskeletal: Bilateral knee discomfort and low back pain; recent right hip replacement at [facility] Hematologic/Oncology: Positive for CML; stable in remission Endocrine: Positive for type 2 diabetes mellitus Neuro: Negative; no changes in balance, headaches Skin: Negative; No rashes or skin lesions Psychiatric: Negative; No behavioral problems, depression Sleep: Negative; No snoring, daytime sleepiness, hypersomnolence, bruxism, restless legs, hypnogognic hallucinations, no cataplexy Other comprehensive 14 point system review is negative.  PE BP 130/76   Pulse 88   Ht 6' 1" (1.854 m)   Wt 247 lb 9.6 oz (112.3 kg)   SpO2 97%   BMI 32.67 kg/m    Repeat blood pressure by me 120/72  Wt Readings from Last 3 Encounters:  01/05/21 247 lb 9.6 oz (112.3 kg)  12/30/20 247 lb 9.6 oz (112.3 kg)  09/04/20 253 lb 8.5 oz (115 kg)   General: Alert, oriented, no distress.  Skin: normal turgor, no rashes, warm and dry HEENT:  Normocephalic, atraumatic. Pupils equal round and reactive  to light; sclera anicteric; extraocular muscles intact;  Nose without nasal septal hypertrophy Mouth/Parynx benign; Mallinpatti scale 2 Neck: No JVD, no carotid bruits; normal carotid upstroke Lungs: clear to ausculatation and percussion; no wheezing or rales Chest wall: without tenderness to palpitation Heart: PMI not displaced, RRR, s1 s2 normal, 1/6 systolic murmur, no diastolic murmur, no rubs, gallops, thrills, or heaves Abdomen: soft, nontender; no hepatosplenomehaly, BS+; abdominal aorta nontender and not dilated by palpation. Back: no CVA tenderness Pulses 2+ Musculoskeletal: full range of motion, normal strength, no joint deformities Extremities: no clubbing cyanosis or edema, Homan's sign negative  Neurologic: grossly nonfocal; Cranial nerves grossly wnl Psychologic: Normal mood and affect   ECG (independently read by me): NSR at 88; LVH; normal intervals  January 2021 ECG (independently read by me): Normal sinus rhythm at 72 bpm.  LVH by voltage criteria.  ST abnormality in lead III, aVF, and V6.  July 2020 ECG (independently read by me): NSR at 62; LVH; T wave abnormality in III  April 2019 ECG (independently read by me): normal sinus rhythm at 68 bpm.  LVH by voltage.  November 2017 ECG (independently read by me): Normal sinus rhythm at 65 bpm.  LVH by voltage.  Normal intervals.  T-wave abnormality in lead 3.  November 2016 ECG (independently read by me): Normal sinus rhythm at 66.  LVH by voltage criteria.  October 2015 ECG (independently read by me): Sinus rhythm at 76 beats per minute.  LVH by voltage criteria in lead aVL  October 2014 ECG: Sinus rhythm. Moderate LVH by voltage criteria. Early repolarization in V2  LABS:  BMP Latest Ref Rng & Units 03/18/2020 12/17/2019 07/29/2019  Glucose 65 - 99 mg/dL 220(H) 95 165(H)  BUN 7 - 25 mg/dL _0 Creatinine 0.70 - 1.25 mg/dL 1.45(H) 1.48(H) 1.42(H)   BUN/Creat Ratio 6 - 22 (calc) _1 Sodium 135 - 146 mmol/L 138 139 138  Potassium 3.5 - 5.3 mmol/L 4.6 4.2 5.0  Chloride 98 - 110 mmol/L 104 102 106  CO2 20 - 32 mmol/L _2 Calcium 8.6 - 10.3 mg/dL 9.6 9.9 10.3   Hepatic Function Latest Ref Rng & Units 03/18/2020 12/17/2019 07/29/2019  Total Protein 6.1 - 8.1 g/dL 6.8 6.7 7.0  Albumin 3.8 - 4.8 g/dL - 4.0 -  AST 10 - 35 U/L _3 ALT 9 - 46 U/L _4 Alk Phosphatase 39 - 117 IU/L - 84 -  Total Bilirubin 0.2 - 1.2 mg/dL 0.3 0.3 0.4   CBC Latest Ref Rng & Units 05/18/2019 07/07/2018 05/02/2009  WBC 4.0 - 10.5 K/uL 11.1(H) 8.4 7.3  Hemoglobin 13.0 - 17.0 g/dL 14.9 12.9(L) 13.3  Hematocrit 39.0 - 52.0 % 44.8 36.9(L) 39.1  Platelets 150 - 400 K/uL 296 319 236   Lab Results  Component Value Date   MCV 85.5 05/18/2019   MCV 88.3 07/07/2018   MCV 92.1 05/02/2009            05/01/2009   Lab Results  Component Value Date   HGBA1C 9.2 (A) 03/24/2020  Lipid Panel     Component Value Date/Time   CHOL 136 03/18/2020 0803   CHOL 150 12/17/2019 0917   TRIG 111 03/18/2020 0803   HDL 39 (L) 03/18/2020 0803   HDL 41 12/17/2019 0917   CHOLHDL 3.5 03/18/2020 0803   LDLCALC 78 03/18/2020 0803     RADIOLOGY: No results found.  IMPRESSION: 1. Essential hypertension  2. Controlled type 2 diabetes mellitus without complication, without long-term current use of insulin ([place])   3. Mild obesity   4. Renal insufficiency   5. CML (chronic myelocytic leukemia) ([place])   6. Hyperlipidemia with target LDL less than 70      ASSESSMENT AND PLAN: Mr. Braedin Millhouse is a 65 year old African-American gentleman gentleman who has a history of hypertension with documented mild left ventricular hypertrophy on echocardiography and also by ECG criteria.   He has a history of renal insufficiency which has been stable as well as CML, which has been in remission.  He underwent an ultrasound guided renal core biopsy to further evaluate his  renal insufficiency and proteinuria ordered by by Dr. Lowanda Foster and was told that this was okay.  Since Dr. Lowanda Foster retired he now is followed by Dr. Theador Hawthorne.  His blood pressure today is controlled on his regimen consisting of amlodipine 10 mg, olmesartan 40 mg, in addition to Bystolic.  ECG today remains stable with sinus rhythm and LVH.  He is now on increased rosuvastatin to 40 mg for hyperlipidemia.  LDL cholesterol in April 2021 was 78.  He sees Dr. Sharilyn Sites for primary care who will be rechecking his laboratory.  He had issues with poorly controlled diabetes but this has improved and he was able to achieve a hemoglobin A1c less than seventy prior to his right hip replacement surgery.  He has lost weight since my last evaluation BMI is now 32.7 consistent with mild obesity.  He was found to have very mild iron deficiency and will be undergoing follow-up colonoscopy.  Remotely he had been found to have several polyps.  Cardiac wise he remained stable.  I will see him in 1 year for reevaluation or sooner as needed.   Troy Sine, MD, [facility]  01/07/2021 9:15 AM

## 2021-01-06 ENCOUNTER — Encounter: Payer: Self-pay | Admitting: *Deleted

## 2021-01-07 ENCOUNTER — Other Ambulatory Visit: Payer: Self-pay | Admitting: Cardiovascular Disease

## 2021-01-07 ENCOUNTER — Encounter: Payer: Self-pay | Admitting: Cardiovascular Disease

## 2021-01-25 DIAGNOSIS — E1122 Type 2 diabetes mellitus with diabetic chronic kidney disease: Secondary | ICD-10-CM | POA: Diagnosis not present

## 2021-01-25 DIAGNOSIS — I129 Hypertensive chronic kidney disease with stage 1 through stage 4 chronic kidney disease, or unspecified chronic kidney disease: Secondary | ICD-10-CM | POA: Diagnosis not present

## 2021-01-25 DIAGNOSIS — N189 Chronic kidney disease, unspecified: Secondary | ICD-10-CM | POA: Diagnosis not present

## 2021-01-25 DIAGNOSIS — E1129 Type 2 diabetes mellitus with other diabetic kidney complication: Secondary | ICD-10-CM | POA: Diagnosis not present

## 2021-01-25 DIAGNOSIS — R809 Proteinuria, unspecified: Secondary | ICD-10-CM | POA: Diagnosis not present

## 2021-02-02 DIAGNOSIS — N189 Chronic kidney disease, unspecified: Secondary | ICD-10-CM | POA: Diagnosis not present

## 2021-02-02 DIAGNOSIS — R809 Proteinuria, unspecified: Secondary | ICD-10-CM | POA: Diagnosis not present

## 2021-02-02 DIAGNOSIS — E1129 Type 2 diabetes mellitus with other diabetic kidney complication: Secondary | ICD-10-CM | POA: Diagnosis not present

## 2021-02-02 DIAGNOSIS — I129 Hypertensive chronic kidney disease with stage 1 through stage 4 chronic kidney disease, or unspecified chronic kidney disease: Secondary | ICD-10-CM | POA: Diagnosis not present

## 2021-02-02 DIAGNOSIS — E1122 Type 2 diabetes mellitus with diabetic chronic kidney disease: Secondary | ICD-10-CM | POA: Diagnosis not present

## 2021-02-07 NOTE — Patient Instructions (Signed)
Your procedure is scheduled on: 02/13/2021  Report to [place] entrance at     12:30 PM.  Call this number if you have problems the morning of surgery: [phone]   Remember:              Follow Directions on the letter you received from Your Physician's office regarding the Bowel Prep              No Smoking the day of Procedure :   Take these medicines the morning of surgery with A SIP OF WATER: Amlodipine and Bystolic              No diabetic medication am of procedure              Take only 1/2 dose of Tresiba 35 units the night before procedure   Do not wear jewelry, make-up or nail polish.    Do not bring valuables to the hospital.  Contacts, dentures or bridgework may not be worn into surgery.  .   Patients discharged the day of surgery will not be allowed to drive home.     Colonoscopy, Adult, Care After This sheet gives you information about how to care for yourself after your procedure. Your health care provider may also give you more specific instructions. If you have problems or questions, contact your health care provider. What can I expect after the procedure? After the procedure, it is common to have:  A small amount of blood in your stool for 24 hours after the procedure.  Some gas.  Mild abdominal cramping or bloating.  Follow these instructions at home: General instructions   For the first 24 hours after the procedure: ? Do not drive or use machinery. ? Do not sign important documents. ? Do not drink alcohol. ? Do your regular daily activities at a slower pace than normal. ? Eat soft, easy-to-digest foods. ? Rest often.  Take over-the-counter or prescription medicines only as told by your health care provider.  It is up to you to get the results of your procedure. Ask your health care provider, or the department performing the procedure, when your results will be ready. Relieving cramping and bloating  Try walking around when you have  cramps or feel bloated.  Apply heat to your abdomen as told by your health care provider. Use a heat source that your health care provider recommends, such as a moist heat pack or a heating pad. ? Place a towel between your skin and the heat source. ? Leave the heat on for 20-30 minutes. ? Remove the heat if your skin turns bright red. This is especially important if you are unable to feel pain, heat, or cold. You may have a greater risk of getting burned. Eating and drinking  Drink enough fluid to keep your urine clear or pale yellow.  Resume your normal diet as instructed by your health care provider. Avoid heavy or fried foods that are hard to digest.  Avoid drinking alcohol for as long as instructed by your health care provider. Contact a health care provider if:  You have blood in your stool 2-3 days after the procedure. Get help right away if:  You have more than a small spotting of blood in your stool.  You pass large blood clots in your stool.  Your abdomen is swollen.  You have nausea or vomiting.  You have a fever.  You have increasing abdominal pain that is not relieved  with medicine. This information is not intended to replace advice given to you by your health care provider. Make sure you discuss any questions you have with your health care provider. Document Released: 06/26/2004 Document Revised: 08/06/2016 Document Reviewed: 01/24/2016 Elsevier Interactive Patient Education  [employer].

## 2021-02-09 ENCOUNTER — Encounter (HOSPITAL_COMMUNITY): Payer: Self-pay

## 2021-02-09 ENCOUNTER — Encounter (HOSPITAL_COMMUNITY)
Admission: RE | Admit: 2021-02-09 | Discharge: 2021-02-09 | Disposition: A | Discharge status: Home / Self Care | Payer: Medicare Other | Source: Ambulatory Visit | Attending: Internal Medicine | Admitting: Internal Medicine

## 2021-02-09 ENCOUNTER — Other Ambulatory Visit (HOSPITAL_COMMUNITY)
Admission: RE | Admit: 2021-02-09 | Discharge: 2021-02-09 | Disposition: A | Discharge status: Home / Self Care | Payer: Medicare Other | Source: Ambulatory Visit | Attending: Internal Medicine | Admitting: Internal Medicine

## 2021-02-09 ENCOUNTER — Other Ambulatory Visit: Payer: Self-pay

## 2021-02-09 DIAGNOSIS — Z20822 Contact with and (suspected) exposure to covid-19: Secondary | ICD-10-CM | POA: Insufficient documentation

## 2021-02-09 DIAGNOSIS — Z01812 Encounter for preprocedural laboratory examination: Secondary | ICD-10-CM | POA: Diagnosis not present

## 2021-02-09 LAB — CBC WITH DIFFERENTIAL/PLATELET
Abs Immature Granulocytes: 0.03 10*3/uL (ref 0.00–0.07)
Basophils Absolute: 0.1 10*3/uL (ref 0.0–0.1)
Basophils Relative: 1 %
Eosinophils Absolute: 0.3 10*3/uL (ref 0.0–0.5)
Eosinophils Relative: 3 %
HCT: 50.3 % (ref 39.0–52.0)
Hemoglobin: 16.2 g/dL (ref 13.0–17.0)
Immature Granulocytes: 0 %
Lymphocytes Relative: 25 %
Lymphs Abs: 2.4 10*3/uL (ref 0.7–4.0)
MCH: 27.3 pg (ref 26.0–34.0)
MCHC: 32.2 g/dL (ref 30.0–36.0)
MCV: 84.7 fL (ref 80.0–100.0)
Monocytes Absolute: 0.9 10*3/uL (ref 0.1–1.0)
Monocytes Relative: 9 %
Neutro Abs: 5.9 10*3/uL (ref 1.7–7.7)
Neutrophils Relative %: 62 %
Platelets: 283 10*3/uL (ref 150–400)
RBC: 5.94 MIL/uL — ABNORMAL HIGH (ref 4.22–5.81)
RDW: 16.8 % — ABNORMAL HIGH (ref 11.5–15.5)
WBC: 9.6 10*3/uL (ref 4.0–10.5)
nRBC: 0 % (ref 0.0–0.2)

## 2021-02-09 LAB — BASIC METABOLIC PANEL
Anion gap: 10 (ref 5–15)
BUN: 29 mg/dL — ABNORMAL HIGH (ref 8–23)
CO2: 20 mmol/L — ABNORMAL LOW (ref 22–32)
Calcium: 9.9 mg/dL (ref 8.9–10.3)
Chloride: 109 mmol/L (ref 98–111)
Creatinine, Ser: 1.74 mg/dL — ABNORMAL HIGH (ref 0.61–1.24)
GFR, Estimated: 43 mL/min — ABNORMAL LOW (ref >60)
Glucose, Bld: 93 mg/dL (ref 70–99)
Potassium: 5 mmol/L (ref 3.5–5.1)
Sodium: 139 mmol/L (ref 135–145)

## 2021-02-09 LAB — SARS CORONAVIRUS 2 (TAT 6-24 HRS): SARS Coronavirus 2: NEGATIVE

## 2021-02-13 ENCOUNTER — Ambulatory Visit (HOSPITAL_COMMUNITY): Payer: Medicare Other | Admitting: Anesthesiology

## 2021-02-13 ENCOUNTER — Other Ambulatory Visit: Payer: Self-pay

## 2021-02-13 ENCOUNTER — Encounter (HOSPITAL_COMMUNITY): Payer: Self-pay | Admitting: Internal Medicine

## 2021-02-13 ENCOUNTER — Ambulatory Visit (HOSPITAL_COMMUNITY)
Admission: RE | Admit: 2021-02-13 | Discharge: 2021-02-13 | Disposition: A | Discharge status: Home / Self Care | Payer: Medicare Other | Attending: Internal Medicine | Admitting: Internal Medicine

## 2021-02-13 ENCOUNTER — Encounter (HOSPITAL_COMMUNITY)
Admission: RE | Disposition: A | Discharge status: Home / Self Care | Payer: Self-pay | Source: Home / Self Care | Attending: Internal Medicine

## 2021-02-13 DIAGNOSIS — Z7982 Long term (current) use of aspirin: Secondary | ICD-10-CM | POA: Insufficient documentation

## 2021-02-13 DIAGNOSIS — D509 Iron deficiency anemia, unspecified: Secondary | ICD-10-CM | POA: Insufficient documentation

## 2021-02-13 DIAGNOSIS — K5730 Diverticulosis of large intestine without perforation or abscess without bleeding: Secondary | ICD-10-CM | POA: Insufficient documentation

## 2021-02-13 DIAGNOSIS — E1165 Type 2 diabetes mellitus with hyperglycemia: Secondary | ICD-10-CM | POA: Diagnosis not present

## 2021-02-13 DIAGNOSIS — Z79899 Other long term (current) drug therapy: Secondary | ICD-10-CM | POA: Insufficient documentation

## 2021-02-13 DIAGNOSIS — Z856 Personal history of leukemia: Secondary | ICD-10-CM | POA: Insufficient documentation

## 2021-02-13 DIAGNOSIS — Z7984 Long term (current) use of oral hypoglycemic drugs: Secondary | ICD-10-CM | POA: Diagnosis not present

## 2021-02-13 DIAGNOSIS — Z96641 Presence of right artificial hip joint: Secondary | ICD-10-CM | POA: Diagnosis not present

## 2021-02-13 DIAGNOSIS — K635 Polyp of colon: Secondary | ICD-10-CM

## 2021-02-13 DIAGNOSIS — Z87891 Personal history of nicotine dependence: Secondary | ICD-10-CM | POA: Insufficient documentation

## 2021-02-13 DIAGNOSIS — Z794 Long term (current) use of insulin: Secondary | ICD-10-CM | POA: Diagnosis not present

## 2021-02-13 DIAGNOSIS — D120 Benign neoplasm of cecum: Secondary | ICD-10-CM | POA: Insufficient documentation

## 2021-02-13 DIAGNOSIS — Z800 Family history of malignant neoplasm of digestive organs: Secondary | ICD-10-CM | POA: Diagnosis not present

## 2021-02-13 HISTORY — PX: POLYPECTOMY: SHX5525

## 2021-02-13 HISTORY — PX: COLONOSCOPY WITH PROPOFOL: SHX5780

## 2021-02-13 LAB — GLUCOSE, CAPILLARY: Glucose-Capillary: 102 mg/dL — ABNORMAL HIGH (ref 70–99)

## 2021-02-13 SURGERY — COLONOSCOPY WITH PROPOFOL
Anesthesia: General

## 2021-02-13 MED ORDER — LACTATED RINGERS IV SOLN: INTRAVENOUS | Status: DC

## 2021-02-13 MED ORDER — PROPOFOL 10 MG/ML IV BOLUS
INTRAVENOUS | Status: DC | PRN
  Administered 2021-02-13: 100 mg via INTRAVENOUS

## 2021-02-13 MED ORDER — PHENYLEPHRINE 40 MCG/ML (10ML) SYRINGE FOR IV PUSH (FOR BLOOD PRESSURE SUPPORT)
PREFILLED_SYRINGE | INTRAVENOUS | Status: AC
  Filled 2021-02-13: qty 10

## 2021-02-13 MED ORDER — PHENYLEPHRINE 40 MCG/ML (10ML) SYRINGE FOR IV PUSH (FOR BLOOD PRESSURE SUPPORT)
PREFILLED_SYRINGE | INTRAVENOUS | Status: DC | PRN
  Administered 2021-02-13: 80 ug via INTRAVENOUS

## 2021-02-13 MED ORDER — STERILE WATER FOR IRRIGATION IR SOLN
Status: DC | PRN
  Administered 2021-02-13: 100 mL

## 2021-02-13 MED ORDER — PROPOFOL 500 MG/50ML IV EMUL
INTRAVENOUS | Status: DC | PRN
  Administered 2021-02-13: 125 ug/kg/min via INTRAVENOUS

## 2021-02-13 MED ORDER — LIDOCAINE HCL (CARDIAC) PF 100 MG/5ML IV SOSY
PREFILLED_SYRINGE | INTRAVENOUS | Status: DC | PRN
  Administered 2021-02-13: 50 mg via INTRAVENOUS

## 2021-02-13 NOTE — Anesthesia Preprocedure Evaluation (Signed)
Anesthesia Evaluation  Patient identified by MRN, date of birth, ID band Patient awake    Reviewed: Allergy & Precautions, H&P , NPO status , Patient's Chart, lab work & pertinent test results, reviewed documented beta blocker date and time   Airway Mallampati: II  TM Distance: >3 FB Neck ROM: full    Dental no notable dental hx. (+) Teeth Intact   Pulmonary neg pulmonary ROS, former smoker,    Pulmonary exam normal breath sounds clear to auscultation       Cardiovascular Exercise Tolerance: Good hypertension, negative cardio ROS   Rhythm:regular Rate:Normal     Neuro/Psych negative neurological ROS  negative psych ROS   GI/Hepatic negative GI ROS, Neg liver ROS,   Endo/Other  negative endocrine ROSdiabetes, Poorly Controlled, Type 2  Renal/GU Renal disease  negative genitourinary   Musculoskeletal   Abdominal   Peds  Hematology negative hematology ROS (+)   Anesthesia Other Findings   Reproductive/Obstetrics negative OB ROS                             Anesthesia Physical Anesthesia Plan  ASA: III  Anesthesia Plan: General   Post-op Pain Management:    Induction:   PONV Risk Score and Plan: Propofol infusion  Airway Management Planned:   Additional Equipment:   Intra-op Plan:   Post-operative Plan:   Informed Consent: I have reviewed the patients History and Physical, chart, labs and discussed the procedure including the risks, benefits and alternatives for the proposed anesthesia with the patient or authorized representative who has indicated his/her understanding and acceptance.     Dental Advisory Given  Plan Discussed with: CRNA  Anesthesia Plan Comments:         Anesthesia Quick Evaluation

## 2021-02-13 NOTE — Discharge Instructions (Signed)
Colonoscopy Discharge Instructions  Read the instructions outlined below and refer to this sheet in the next few weeks. These discharge instructions provide you with general information on caring for yourself after you leave the hospital. Your doctor may also give you specific instructions. While your treatment has been planned according to the most current medical practices available, unavoidable complications occasionally occur. If you have any problems or questions after discharge, call Dr. Gala Romney at [phone]. ACTIVITY  You may resume your regular activity, but move at a slower pace for the next 24 hours.   Take frequent rest periods for the next 24 hours.   Walking will help get rid of the air and reduce the bloated feeling in your belly (abdomen).   No driving for 24 hours (because of the medicine (anesthesia) used during the test).    Do not sign any important legal documents or operate any machinery for 24 hours (because of the anesthesia used during the test).  NUTRITION  Drink plenty of fluids.   You may resume your normal diet as instructed by your doctor.   Begin with a light meal and progress to your normal diet. Heavy or fried foods are harder to digest and may make you feel sick to your stomach (nauseated).   Avoid alcoholic beverages for 24 hours or as instructed.  MEDICATIONS  You may resume your normal medications unless your doctor tells you otherwise.  WHAT YOU CAN EXPECT TODAY  Some feelings of bloating in the abdomen.   Passage of more gas than usual.   Spotting of blood in your stool or on the toilet paper.  IF YOU HAD POLYPS REMOVED DURING THE COLONOSCOPY:  No aspirin products for 7 days or as instructed.   No alcohol for 7 days or as instructed.   Eat a soft diet for the next 24 hours.  FINDING OUT THE RESULTS OF YOUR TEST Not all test results are available during your visit. If your test results are not back during the visit, make an appointment  with your caregiver to find out the results. Do not assume everything is normal if you have not heard from your caregiver or the medical facility. It is important for you to follow up on all of your test results.  SEEK IMMEDIATE MEDICAL ATTENTION IF:  You have more than a spotting of blood in your stool.   Your belly is swollen (abdominal distention).   You are nauseated or vomiting.   You have a temperature over 101.   You have abdominal pain or discomfort that is severe or gets worse throughout the day.    1 polyp removed from your colon today  Polyp and diverticulosis information provided  Further recommendations to follow pending review of pathology report  At patient request I called Laverne at [phone] to voicemail.  Left a message.  There is conflict regarding the correct dose of Ozempic and Tresiba in the medical record.  Please check with the prescribing provider regarding the correct dosing.   Colon Polyps  Colon polyps are tissue growths inside the colon, which is part of the large intestine. They are one of the types of polyps that can grow in the body. A polyp may be a round bump or a mushroom-shaped growth. You could have one polyp or more than one. Most colon polyps are noncancerous (benign). However, some colon polyps can become cancerous over time. Finding and removing the polyps early can help prevent this. What are the causes? The  exact cause of colon polyps is not known. What increases the risk? The following factors may make you more likely to develop this condition:  Having a family history of colorectal cancer or colon polyps.  Being older than 66 years of age.  Being younger than 66 years of age and having a significant family history of colorectal cancer or colon polyps or a genetic condition that puts you at higher risk of getting colon polyps.  Having inflammatory bowel disease, such as ulcerative colitis or Crohn's disease.  Having  certain conditions passed from parent to child (hereditary conditions), such as: ? Familial adenomatous polyposis (FAP). ? Lynch syndrome. ? Turcot syndrome. ? Peutz-Jeghers syndrome. ? MUTYH-associated polyposis (MAP).  Being overweight.  Certain lifestyle factors. These include smoking cigarettes, drinking too much alcohol, not getting enough exercise, and eating a diet that is high in fat and red meat and low in fiber.  Having had childhood cancer that was treated with radiation of the abdomen. What are the signs or symptoms? Many times, there are no symptoms. If you have symptoms, they may include:  Blood coming from the rectum during a bowel movement.  Blood in the stool (feces). The blood may be bright red or very dark in color.  Pain in the abdomen.  A change in bowel habits, such as constipation or diarrhea. How is this diagnosed? This condition is diagnosed with a colonoscopy. This is a procedure in which a lighted, flexible scope is inserted into the opening between the buttocks (anus) and then passed into the colon to examine the area. Polyps are sometimes found when a colonoscopy is done as part of routine cancer screening tests. How is this treated? This condition is treated by removing any polyps that are found. Most polyps can be removed during a colonoscopy. Those polyps will then be tested for cancer. Additional treatment may be needed depending on the results of testing. Follow these instructions at home: Eating and drinking  Eat foods that are high in fiber, such as fruits, vegetables, and whole grains.  Eat foods that are high in calcium and vitamin D, such as milk, cheese, yogurt, eggs, liver, fish, and broccoli.  Limit foods that are high in fat, such as fried foods and desserts.  Limit the amount of red meat, precooked or cured meat, or other processed meat that you eat, such as hot dogs, sausages, bacon, or meat loaves.  Limit sugary drinks.    Lifestyle  Maintain a healthy weight, or lose weight if recommended by your health care provider.  Exercise every day or as told by your health care provider.  Do not use any products that contain nicotine or tobacco, such as cigarettes, e-cigarettes, and chewing tobacco. If you need help quitting, ask your health care provider.  Do not drink alcohol if: ? Your health care provider tells you not to drink. ? You are pregnant, may be pregnant, or are planning to become pregnant.  If you drink alcohol: ? Limit how much you use to:  0-1 drink a day for women.  0-2 drinks a day for men. ? Know how much alcohol is in your drink. In the [place] one drink equals one 12 oz bottle of beer (355 mL), one 5 oz glass of wine (148 mL), or one 1 oz glass of hard liquor (44 mL). General instructions  Take over-the-counter and prescription medicines only as told by your health care provider.  Keep all follow-up visits. This is important. This includes having  regularly scheduled colonoscopies. Talk to your health care provider about when you need a colonoscopy. Contact a health care provider if:  You have new or worsening bleeding during a bowel movement.  You have new or increased blood in your stool.  You have a change in bowel habits.  You lose weight for no known reason. Summary  Colon polyps are tissue growths inside the colon, which is part of the large intestine. They are one type of polyp that can grow in the body.  Most colon polyps are noncancerous (benign), but some can become cancerous over time.  This condition is diagnosed with a colonoscopy.  This condition is treated by removing any polyps that are found. Most polyps can be removed during a colonoscopy. This information is not intended to replace advice given to you by your health care provider. Make sure you discuss any questions you have with your health care provider. Document Revised: 03/02/2020 Document Reviewed:  03/02/2020 Elsevier Patient Education  2021 [place].  Diverticulosis  Diverticulosis is a condition that develops when small pouches (diverticula) form in the wall of the large intestine (colon). The colon is where water is absorbed and stool (feces) is formed. The pouches form when the inside layer of the colon pushes through weak spots in the outer layers of the colon. You may have a few pouches or many of them. The pouches usually do not cause problems unless they become inflamed or infected. When this happens, the condition is called diverticulitis. What are the causes? The cause of this condition is not known. What increases the risk? The following factors may make you more likely to develop this condition:  Being older than age 46. Your risk for this condition increases with age. Diverticulosis is rare among people younger than age 49. By age 76, many people have it.  Eating a low-fiber diet.  Having frequent constipation.  Being overweight.  Not getting enough exercise.  Smoking.  Taking over-the-counter pain medicines, like aspirin and ibuprofen.  Having a family history of diverticulosis. What are the signs or symptoms? In most people, there are no symptoms of this condition. If you do have symptoms, they may include:  Bloating.  Cramps in the abdomen.  Constipation or diarrhea.  Pain in the lower left side of the abdomen. How is this diagnosed? Because diverticulosis usually has no symptoms, it is most often diagnosed during an exam for other colon problems. The condition may be diagnosed by:  Using a flexible scope to examine the colon (colonoscopy).  Taking an X-ray of the colon after dye has been put into the colon (barium enema).  Having a CT scan. How is this treated? You may not need treatment for this condition. Your health care provider may recommend treatment to prevent problems. You may need treatment if you have symptoms or if you previously  had diverticulitis. Treatment may include:  Eating a high-fiber diet.  Taking a fiber supplement.  Taking a live bacteria supplement (probiotic).  Taking medicine to relax your colon.   Follow these instructions at home: Medicines  Take over-the-counter and prescription medicines only as told by your health care provider.  If told by your health care provider, take a fiber supplement or probiotic. Constipation prevention Your condition may cause constipation. To prevent or treat constipation, you may need to:  Drink enough fluid to keep your urine pale yellow.  Take over-the-counter or prescription medicines.  Eat foods that are high in fiber, such as beans, whole  grains, and fresh fruits and vegetables.  Limit foods that are high in fat and processed sugars, such as fried or sweet foods.   General instructions  Try not to strain when you have a bowel movement.  Keep all follow-up visits as told by your health care provider. This is important. Contact a health care provider if you:  Have pain in your abdomen.  Have bloating.  Have cramps.  Have not had a bowel movement in 3 days. Get help right away if:  Your pain gets worse.  Your bloating becomes very bad.  You have a fever or chills, and your symptoms suddenly get worse.  You vomit.  You have bowel movements that are bloody or black.  You have bleeding from your rectum. Summary  Diverticulosis is a condition that develops when small pouches (diverticula) form in the wall of the large intestine (colon).  You may have a few pouches or many of them.  This condition is most often diagnosed during an exam for other colon problems.  Treatment may include increasing the fiber in your diet, taking supplements, or taking medicines. This information is not intended to replace advice given to you by your health care provider. Make sure you discuss any questions you have with your health care provider. Document  Revised: 06/11/2019 Document Reviewed: 06/11/2019 Elsevier Patient Education  [place].

## 2021-02-13 NOTE — H&P (Signed)
@LOGO @   Primary Care Physician:  Sharilyn Sites, MD Primary Gastroenterologist:  Dr. Theador Hawthorne  Pre-Procedure History & Physical: HPI:  Dylan Spears. is a 65 y.o. male here for diagnostic colonoscopy.  History of iron deficiency anemia in evolution in the setting of evolving iron deficiency anemia.  Occult blood negative recently.  Past Medical History:  Diagnosis Date  . Chronic back pain   . CML (chronic myelocytic leukemia) ([place])   . Diabetes mellitus without complication ([place])   . History of stress test    myoview study suggested the possibility of basal inferior wall ischemia which was present in all 3 tonographic views.  [name] Hx of echocardiogram 05/2012   EF 55%Mildly hypertrophic left ventricle with normal systolic function and early signs of mild diastolic dysfuntion. Moderately dilated left atrium, mild aortic valve sclerosis with out stenosis.  . Hyperlipemia   . Hypertension   . Leukemia [facility])     Past Surgical History:  Procedure Laterality Date  . BACK SURGERY  12/2006  . CARDIAC CATHETERIZATION  2010   revealed mild nonobstructive CAD  . CARDIAC CATHETERIZATION  07/2012   This showed essentially normal coronary arteries without obstruction. there was normal LV function.  . COLONOSCOPY WITH PROPOFOL N/A 07/14/2018   Procedure: COLONOSCOPY WITH PROPOFOL;  Surgeon: Daneil Dolin, MD;  Location: [facility] SU[facility];  Service: Endoscopy;  Laterality: N/A;  10:45am  . KNEE SURGERY Left 01/1994  . LEFT HEART CATHETERIZATION WITH CORONARY ANGIOGRAM N/A 07/29/2012   Procedure: LEFT HEART CATHETERIZATION WITH CORONARY ANGIOGRAM;  Surgeon: Troy Sine, MD;  Location: [facility] CATH LAB;  Service: Cardiovascular;  Laterality: N/A;  . POLYPECTOMY  07/14/2018   Procedure: POLYPECTOMY;  Surgeon: Daneil Dolin, MD;  Location: [facility] SUITE;  Service: Endoscopy;;  colon  . TOTAL HIP ARTHROPLASTY Right     Prior to Admission medications   Medication Sig Start Date End Date Taking?  Authorizing Provider  amLODipine (NORVASC) 10 MG tablet Take 10 mg by mouth daily.   Yes [provider]  aspirin EC 81 MG tablet Take 81 mg by mouth daily.   Yes [provider]  BYSTOLIC 10 MG tablet TAKE ONE TABLET BY MOUTH EVERY DAY. Patient taking differently: Take 10 mg by mouth daily. 03/17/19  Yes Troy Sine, MD  Cholecalciferol (VITAMIN D-3) 25 MCG (1000 UT) CAPS Take 1,000 Units by mouth daily.   Yes [provider]  empagliflozin (JARDIANCE) 10 MG TABS tablet Take 10 mg by mouth daily.   Yes [provider]  ferrous sulfate 325 (65 FE) MG tablet Take 325 mg by mouth 2 (two) times daily.   Yes [provider]  insulin lispro (HUMALOG) 100 UNIT/ML KwikPen INJECT 20-26 UNITS INTO THE SKIN 3 TIMES DAILY BEFORE MEALS. Patient taking differently: Inject 10 Units into the skin 3 (three) times daily. 05/20/20  Yes Nida, Marella Chimes, MD  Insulin Pen Needle (SURE COMFORT PEN NEEDLES) 31G X 8 MM MISC USING WITH INSULIN INJECTIONS 4X DAILY. 05/02/20  Yes Nida, Marella Chimes, MD  Multiple Vitamin (MULTIVITAMIN WITH MINERALS) TABS Take 1 tablet by mouth daily.   Yes [provider]  olmesartan (BENICAR) 40 MG tablet Take 40 mg by mouth daily.   Yes [provider]  Oxycodone HCl 10 MG TABS Take 10 mg by mouth daily as needed (for pain.).  10/04/16  Yes [provider]  rosuvastatin (CRESTOR) 40 MG tablet TAKE ONE TABLET BY MOUTH ONCE DAILY. Patient taking differently:  Take 40 mg by mouth daily. 01/09/21  Yes Troy Sine, MD  Semaglutide (OZEMPIC, 1 MG/DOSE, [place]) Inject 1 mg into the skin daily.   Yes [provider]  sodium bicarbonate 650 MG tablet Take 650 mg by mouth 3 (three) times daily. 03/18/20  Yes [provider]  TRESIBA FLEXTOUCH 200 UNIT/ML FlexTouch Pen INJECT 120 UNITS INTO THE SKIN AT BEDTIME. Patient taking differently: Inject 70 Units into the skin at bedtime. 08/15/20  Yes Nida,  Marella Chimes, MD  ACCU-CHEK AVIVA PLUS test strip USE TO TEST 4 TIMES DAILY. 06/23/20   Cassandria Anger, MD  Accu-Chek Softclix Lancets lancets USE TO TEST 4 TIMES DAILY. 04/14/20   Cassandria Anger, MD  Cholecalciferol (VITAMIN D3) 1000000 UNIT/GM LIQD by Does not apply route. Patient not taking: No sig reported    [provider]  OZEMPIC, 0.25 OR 0.5 MG/DOSE, 2 MG/1.5ML SOPN Inject 0.25 mg into the skin once a week. Patient not taking: Reported on 01/27/2021 06/29/20   [provider]    Allergies as of 01/06/2021  . (No Known Allergies)    Family History  Problem Relation Age of Onset  . Colon cancer Mother 72  . Cancer Father        Unsure of what type; not colon CA    Social History   Socioeconomic History  . Marital status: Married    Spouse name: Not on file  . Number of children: Not on file  . Years of education: Not on file  . Highest education level: Not on file  Occupational History  . Not on file  Tobacco Use  . Smoking status: Former Smoker    Packs/day: 0.25    Years: 15.00    Pack years: 3.75    Types: Cigarettes    Quit date: 07/08/1987    Years since quitting: 33.6  . Smokeless tobacco: Never Used  . Tobacco comment: "30 years ago"  Vaping Use  . Vaping Use: Never used  Substance and Sexual Activity  . Alcohol use: Yes    Comment: "very seldom"  . Drug use: Yes    Types: Marijuana  . Sexual activity: Yes    Birth control/protection: None  Other Topics Concern  . Not on file  Social History Narrative  . Not on file   Social Determinants of Health   Financial Resource Strain: Not on file  Food Insecurity: Not on file  Transportation Needs: Not on file  Physical Activity: Not on file  Stress: Not on file  Social Connections: Not on file  Intimate Partner Violence: Not on file    Review of Systems: See HPI, otherwise negative ROS  Physical Exam: BP (!) 127/94   Pulse 88   Temp 98.4 F (36.9 C) (Oral)    Resp 17   Ht 6\' 1"  (1.854 m)   SpO2 97%   BMI 32.32 kg/m  General:   Alert,  Well-developed, well-nourished, pleasant and cooperative in NAD Neck:  Supple; no masses or thyromegaly. No significant cervical adenopathy. Lungs:  Clear throughout to auscultation.   No wheezes, crackles, or rhonchi. No acute distress. Heart:  Regular rate and rhythm; no murmurs, clicks, rubs,  or gallops. Abdomen: Non-distended, normal bowel sounds.  Soft and nontender without appreciable mass or hepatosplenomegaly.   Impression/Plan: 65 year old gentleman with iron deficiency anemia in the setting of multiple family members with colon cancer.  Agree with updating colonoscopy. The risks, benefits, limitations, alternatives and imponderables have been reviewed  with the patient. Questions have been answered. All parties are agreeable.      Notice: This dictation was prepared with Dragon dictation along with smaller phrase technology. Any transcriptional errors that result from this process are unintentional and may not be corrected upon review.

## 2021-02-13 NOTE — Transfer of Care (Signed)
Immediate Anesthesia Transfer of Care Note  Patient: Dylan Spears.  Procedure(s) Performed: COLONOSCOPY WITH PROPOFOL (N/A ) POLYPECTOMY  Patient Location: Short Stay  Anesthesia Type:General  Level of Consciousness: awake, alert  and oriented  Airway & Oxygen Therapy: Patient Spontanous Breathing  Post-op Assessment: Report given to RN and Post -op Vital signs reviewed and stable  Post vital signs: Reviewed and stable  Last Vitals:  Vitals Value Taken Time  BP 114/84 02/13/21 1340  Temp 36.5 C 02/13/21 1340  Pulse 83 02/13/21 1340  Resp 20 02/13/21 1340  SpO2 98 % 02/13/21 1340    Last Pain:  Vitals:   02/13/21 1340  TempSrc: Oral  PainSc: 0-No pain      Patients Stated Pain Goal: 3 (79/15/04 1364)  Complications: No complications documented.

## 2021-02-13 NOTE — Anesthesia Postprocedure Evaluation (Signed)
Anesthesia Post Note  Patient: Cora Stetson.  Procedure(s) Performed: COLONOSCOPY WITH PROPOFOL (N/A ) POLYPECTOMY  Patient location during evaluation: PACU Anesthesia Type: General Level of consciousness: awake Pain management: pain level controlled Vital Signs Assessment: post-procedure vital signs reviewed and stable Respiratory status: spontaneous breathing and respiratory function stable Cardiovascular status: blood pressure returned to baseline and stable Postop Assessment: no headache and no apparent nausea or vomiting Anesthetic complications: no   No complications documented.   Last Vitals:  Vitals:   02/13/21 1234 02/13/21 1340  BP: (!) 127/94 114/84  Pulse: 88 83  Resp: 17 20  Temp: 36.9 C 36.5 C  SpO2: 97% 98%    Last Pain:  Vitals:   02/13/21 1340  TempSrc: Oral  PainSc: 0-No pain                 Louann Sjogren

## 2021-02-13 NOTE — Anesthesia Procedure Notes (Signed)
Date/Time: 02/13/2021 1:21 PM Performed by: Orlie Dakin, CRNA Pre-anesthesia Checklist: Patient identified, Emergency Drugs available, Suction available and Patient being monitored Patient Re-evaluated:Patient Re-evaluated prior to induction Oxygen Delivery Method: Nasal cannula Induction Type: IV induction Placement Confirmation: positive ETCO2

## 2021-02-14 ENCOUNTER — Encounter: Payer: Self-pay | Admitting: Internal Medicine

## 2021-02-14 LAB — SURGICAL PATHOLOGY

## 2021-02-14 NOTE — Op Note (Signed)
[facility] Patient Name: Dylan Spears Procedure Date: 02/13/2021 12:55 PM MRN: [id number] Date of Birth: [birth year] Attending MD: Norvel Richards , MD CSN: [id number] Age: 65 Admit Type: Outpatient Procedure:                Colonoscopy Indications:              Iron deficiency anemia Providers:                Norvel Richards, MD, Lambert Mody, Thomas Hoff., Technician Referring MD:              Medicines:                Propofol per Anesthesia Complications:            No immediate complications. Estimated Blood Loss:     Estimated blood loss was minimal. Procedure:                Pre-Anesthesia Assessment:                           - Prior to the procedure, a History and Physical                            was performed, and patient medications and                            allergies were reviewed. The patient's tolerance of                            previous anesthesia was also reviewed. The risks                            and benefits of the procedure and the sedation                            options and risks were discussed with the patient.                            All questions were answered, and informed consent                            was obtained. Prior Anticoagulants: The patient has                            taken no previous anticoagulant or antiplatelet                            agents. ASA Grade Assessment: III - A patient with                            severe systemic disease. After reviewing the risks  and benefits, the patient was deemed in                            satisfactory condition to undergo the procedure.                           After obtaining informed consent, the colonoscope                            was passed under direct vision. Throughout the                            procedure, the patient's blood pressure, pulse, and                            oxygen  saturations were monitored continuously. The                            [id number]-H190DL ([id number]) scope was introduced through                            the anus and advanced to the the cecum, identified                            by appendiceal orifice and ileocecal valve. The                            colonoscopy was performed without difficulty. The                            patient tolerated the procedure well. The quality                            of the bowel preparation was adequate. Scope In: 1:18:10 PM Scope Out: 1:34:48 PM Scope Withdrawal Time: 0 hours 7 minutes 52 seconds  Total Procedure Duration: 0 hours 16 minutes 38 seconds  Findings:      The perianal and digital rectal examinations were normal.      Multiple medium-mouthed diverticula were found in the sigmoid colon and       descending colon.      A 5 mm polyp was found in the cecum. The polyp was sessile. The polyp       was removed with a cold snare. Resection and retrieval were complete.       Estimated blood loss was minimal.      The exam was otherwise without abnormality on direct and retroflexion       views. Impression:               - Diverticulosis in the sigmoid colon and in the                            descending colon.                           - One 5 mm polyp in the cecum, removed  with a cold                            snare. Resected and retrieved.                           - The examination was otherwise normal on direct                            and retroflexion views. Moderate Sedation:      Moderate (conscious) sedation was personally administered by an       anesthesia professional. The following parameters were monitored: oxygen       saturation, heart rate, blood pressure, respiratory rate, EKG, adequacy       of pulmonary ventilation, and response to care. Recommendation:           - Patient has a contact number available for                            emergencies. The signs and symptoms of  potential                            delayed complications were discussed with the                            patient. Return to normal activities tomorrow.                            Written discharge instructions were provided to the                            patient.                           - Resume previous diet.                           - Continue present medications.                           - Repeat colonoscopy date to be determined after                            pending pathology results are reviewed for                            surveillance.                           - Return to GI office (date not yet determined). Procedure Code(s):        --- Professional ---                           [phone], Colonoscopy, flexible; with removal of                            tumor(s), polyp(s),  or other lesion(s) by snare                            technique Diagnosis Code(s):        --- Professional ---                           K63.5, Polyp of colon                           D50.9, Iron deficiency anemia, unspecified                           K57.30, Diverticulosis of large intestine without                            perforation or abscess without bleeding CPT copyright 2019 American Medical Association. All rights reserved. The codes documented in this report are preliminary and upon coder review may  be revised to meet current compliance requirements. Cristopher Estimable. Garrie Woodin, MD Norvel Richards, MD 02/14/2021 12:47:31 PM This report has been signed electronically. Number of Addenda: 0

## 2021-02-20 DIAGNOSIS — E1129 Type 2 diabetes mellitus with other diabetic kidney complication: Secondary | ICD-10-CM | POA: Diagnosis not present

## 2021-02-20 DIAGNOSIS — C9210 Chronic myeloid leukemia, BCR/ABL-positive, not having achieved remission: Secondary | ICD-10-CM | POA: Diagnosis not present

## 2021-02-20 DIAGNOSIS — Z0001 Encounter for general adult medical examination with abnormal findings: Secondary | ICD-10-CM | POA: Diagnosis not present

## 2021-02-20 DIAGNOSIS — G894 Chronic pain syndrome: Secondary | ICD-10-CM | POA: Diagnosis not present

## 2021-02-20 DIAGNOSIS — R7989 Other specified abnormal findings of blood chemistry: Secondary | ICD-10-CM | POA: Diagnosis not present

## 2021-02-20 DIAGNOSIS — I10 Essential (primary) hypertension: Secondary | ICD-10-CM | POA: Diagnosis not present

## 2021-02-20 DIAGNOSIS — E1165 Type 2 diabetes mellitus with hyperglycemia: Secondary | ICD-10-CM | POA: Diagnosis not present

## 2021-02-20 DIAGNOSIS — Z1389 Encounter for screening for other disorder: Secondary | ICD-10-CM | POA: Diagnosis not present

## 2021-02-21 ENCOUNTER — Encounter (HOSPITAL_COMMUNITY): Payer: Self-pay | Admitting: Internal Medicine

## 2021-02-22 DIAGNOSIS — I129 Hypertensive chronic kidney disease with stage 1 through stage 4 chronic kidney disease, or unspecified chronic kidney disease: Secondary | ICD-10-CM | POA: Diagnosis not present

## 2021-02-22 DIAGNOSIS — E1122 Type 2 diabetes mellitus with diabetic chronic kidney disease: Secondary | ICD-10-CM | POA: Diagnosis not present

## 2021-02-22 DIAGNOSIS — N1832 Chronic kidney disease, stage 3b: Secondary | ICD-10-CM | POA: Diagnosis not present

## 2021-02-23 ENCOUNTER — Other Ambulatory Visit: Payer: Self-pay | Admitting: "Endocrinology

## 2021-03-09 DIAGNOSIS — C9210 Chronic myeloid leukemia, BCR/ABL-positive, not having achieved remission: Secondary | ICD-10-CM | POA: Diagnosis not present

## 2021-03-17 DIAGNOSIS — C9210 Chronic myeloid leukemia, BCR/ABL-positive, not having achieved remission: Secondary | ICD-10-CM | POA: Diagnosis not present

## 2021-03-25 DIAGNOSIS — I129 Hypertensive chronic kidney disease with stage 1 through stage 4 chronic kidney disease, or unspecified chronic kidney disease: Secondary | ICD-10-CM | POA: Diagnosis not present

## 2021-03-25 DIAGNOSIS — N1832 Chronic kidney disease, stage 3b: Secondary | ICD-10-CM | POA: Diagnosis not present

## 2021-03-25 DIAGNOSIS — E1122 Type 2 diabetes mellitus with diabetic chronic kidney disease: Secondary | ICD-10-CM | POA: Diagnosis not present

## 2021-04-04 DIAGNOSIS — N1831 Chronic kidney disease, stage 3a: Secondary | ICD-10-CM | POA: Diagnosis not present

## 2021-04-04 DIAGNOSIS — Z794 Long term (current) use of insulin: Secondary | ICD-10-CM | POA: Diagnosis not present

## 2021-04-04 DIAGNOSIS — E1122 Type 2 diabetes mellitus with diabetic chronic kidney disease: Secondary | ICD-10-CM | POA: Diagnosis not present

## 2021-04-25 DIAGNOSIS — E1122 Type 2 diabetes mellitus with diabetic chronic kidney disease: Secondary | ICD-10-CM | POA: Diagnosis not present

## 2021-04-25 DIAGNOSIS — N1832 Chronic kidney disease, stage 3b: Secondary | ICD-10-CM | POA: Diagnosis not present

## 2021-04-25 DIAGNOSIS — I129 Hypertensive chronic kidney disease with stage 1 through stage 4 chronic kidney disease, or unspecified chronic kidney disease: Secondary | ICD-10-CM | POA: Diagnosis not present

## 2021-04-27 DIAGNOSIS — E1122 Type 2 diabetes mellitus with diabetic chronic kidney disease: Secondary | ICD-10-CM | POA: Diagnosis not present

## 2021-04-27 DIAGNOSIS — I129 Hypertensive chronic kidney disease with stage 1 through stage 4 chronic kidney disease, or unspecified chronic kidney disease: Secondary | ICD-10-CM | POA: Diagnosis not present

## 2021-04-27 DIAGNOSIS — R809 Proteinuria, unspecified: Secondary | ICD-10-CM | POA: Diagnosis not present

## 2021-04-27 DIAGNOSIS — N189 Chronic kidney disease, unspecified: Secondary | ICD-10-CM | POA: Diagnosis not present

## 2021-04-27 DIAGNOSIS — E1129 Type 2 diabetes mellitus with other diabetic kidney complication: Secondary | ICD-10-CM | POA: Diagnosis not present

## 2021-05-05 DIAGNOSIS — N189 Chronic kidney disease, unspecified: Secondary | ICD-10-CM | POA: Diagnosis not present

## 2021-05-05 DIAGNOSIS — E1122 Type 2 diabetes mellitus with diabetic chronic kidney disease: Secondary | ICD-10-CM | POA: Diagnosis not present

## 2021-05-05 DIAGNOSIS — E1129 Type 2 diabetes mellitus with other diabetic kidney complication: Secondary | ICD-10-CM | POA: Diagnosis not present

## 2021-05-05 DIAGNOSIS — R809 Proteinuria, unspecified: Secondary | ICD-10-CM | POA: Diagnosis not present

## 2021-05-05 DIAGNOSIS — I129 Hypertensive chronic kidney disease with stage 1 through stage 4 chronic kidney disease, or unspecified chronic kidney disease: Secondary | ICD-10-CM | POA: Diagnosis not present

## 2021-05-25 DIAGNOSIS — N1832 Chronic kidney disease, stage 3b: Secondary | ICD-10-CM | POA: Diagnosis not present

## 2021-05-25 DIAGNOSIS — I129 Hypertensive chronic kidney disease with stage 1 through stage 4 chronic kidney disease, or unspecified chronic kidney disease: Secondary | ICD-10-CM | POA: Diagnosis not present

## 2021-05-25 DIAGNOSIS — E1122 Type 2 diabetes mellitus with diabetic chronic kidney disease: Secondary | ICD-10-CM | POA: Diagnosis not present

## 2021-05-26 DIAGNOSIS — G894 Chronic pain syndrome: Secondary | ICD-10-CM | POA: Diagnosis not present

## 2021-05-26 DIAGNOSIS — C9111 Chronic lymphocytic leukemia of B-cell type in remission: Secondary | ICD-10-CM | POA: Diagnosis not present

## 2021-05-26 DIAGNOSIS — N183 Chronic kidney disease, stage 3 unspecified: Secondary | ICD-10-CM | POA: Diagnosis not present

## 2021-05-26 DIAGNOSIS — E1129 Type 2 diabetes mellitus with other diabetic kidney complication: Secondary | ICD-10-CM | POA: Diagnosis not present

## 2021-06-08 DIAGNOSIS — C9210 Chronic myeloid leukemia, BCR/ABL-positive, not having achieved remission: Secondary | ICD-10-CM | POA: Diagnosis not present

## 2021-06-08 DIAGNOSIS — M1712 Unilateral primary osteoarthritis, left knee: Secondary | ICD-10-CM | POA: Diagnosis not present

## 2021-06-08 DIAGNOSIS — Z794 Long term (current) use of insulin: Secondary | ICD-10-CM | POA: Diagnosis not present

## 2021-06-08 DIAGNOSIS — C9211 Chronic myeloid leukemia, BCR/ABL-positive, in remission: Secondary | ICD-10-CM | POA: Diagnosis not present

## 2021-06-08 DIAGNOSIS — N182 Chronic kidney disease, stage 2 (mild): Secondary | ICD-10-CM | POA: Diagnosis not present

## 2021-06-08 DIAGNOSIS — E1122 Type 2 diabetes mellitus with diabetic chronic kidney disease: Secondary | ICD-10-CM | POA: Diagnosis not present

## 2021-06-09 DIAGNOSIS — C9210 Chronic myeloid leukemia, BCR/ABL-positive, not having achieved remission: Secondary | ICD-10-CM | POA: Diagnosis not present

## 2021-06-22 DIAGNOSIS — H2513 Age-related nuclear cataract, bilateral: Secondary | ICD-10-CM | POA: Diagnosis not present

## 2021-06-22 DIAGNOSIS — E1136 Type 2 diabetes mellitus with diabetic cataract: Secondary | ICD-10-CM | POA: Diagnosis not present

## 2021-06-22 DIAGNOSIS — Z794 Long term (current) use of insulin: Secondary | ICD-10-CM | POA: Diagnosis not present

## 2021-06-22 DIAGNOSIS — Z7984 Long term (current) use of oral hypoglycemic drugs: Secondary | ICD-10-CM | POA: Diagnosis not present

## 2021-06-22 DIAGNOSIS — E119 Type 2 diabetes mellitus without complications: Secondary | ICD-10-CM | POA: Diagnosis not present

## 2021-06-25 DIAGNOSIS — E1122 Type 2 diabetes mellitus with diabetic chronic kidney disease: Secondary | ICD-10-CM | POA: Diagnosis not present

## 2021-06-25 DIAGNOSIS — I129 Hypertensive chronic kidney disease with stage 1 through stage 4 chronic kidney disease, or unspecified chronic kidney disease: Secondary | ICD-10-CM | POA: Diagnosis not present

## 2021-06-25 DIAGNOSIS — N1832 Chronic kidney disease, stage 3b: Secondary | ICD-10-CM | POA: Diagnosis not present

## 2021-07-20 DIAGNOSIS — I10 Essential (primary) hypertension: Secondary | ICD-10-CM | POA: Diagnosis not present

## 2021-07-20 DIAGNOSIS — E785 Hyperlipidemia, unspecified: Secondary | ICD-10-CM | POA: Diagnosis not present

## 2021-07-20 DIAGNOSIS — R801 Persistent proteinuria, unspecified: Secondary | ICD-10-CM | POA: Diagnosis not present

## 2021-07-20 DIAGNOSIS — C9210 Chronic myeloid leukemia, BCR/ABL-positive, not having achieved remission: Secondary | ICD-10-CM | POA: Diagnosis not present

## 2021-07-20 DIAGNOSIS — M1712 Unilateral primary osteoarthritis, left knee: Secondary | ICD-10-CM | POA: Diagnosis not present

## 2021-07-20 DIAGNOSIS — N182 Chronic kidney disease, stage 2 (mild): Secondary | ICD-10-CM | POA: Diagnosis not present

## 2021-07-20 DIAGNOSIS — Z96641 Presence of right artificial hip joint: Secondary | ICD-10-CM | POA: Diagnosis not present

## 2021-07-20 DIAGNOSIS — E1169 Type 2 diabetes mellitus with other specified complication: Secondary | ICD-10-CM | POA: Diagnosis not present

## 2021-08-02 DIAGNOSIS — E1129 Type 2 diabetes mellitus with other diabetic kidney complication: Secondary | ICD-10-CM | POA: Diagnosis not present

## 2021-08-02 DIAGNOSIS — N189 Chronic kidney disease, unspecified: Secondary | ICD-10-CM | POA: Diagnosis not present

## 2021-08-02 DIAGNOSIS — E1122 Type 2 diabetes mellitus with diabetic chronic kidney disease: Secondary | ICD-10-CM | POA: Diagnosis not present

## 2021-08-02 DIAGNOSIS — I129 Hypertensive chronic kidney disease with stage 1 through stage 4 chronic kidney disease, or unspecified chronic kidney disease: Secondary | ICD-10-CM | POA: Diagnosis not present

## 2021-08-02 DIAGNOSIS — R809 Proteinuria, unspecified: Secondary | ICD-10-CM | POA: Diagnosis not present

## 2021-08-10 DIAGNOSIS — R809 Proteinuria, unspecified: Secondary | ICD-10-CM | POA: Diagnosis not present

## 2021-08-10 DIAGNOSIS — I129 Hypertensive chronic kidney disease with stage 1 through stage 4 chronic kidney disease, or unspecified chronic kidney disease: Secondary | ICD-10-CM | POA: Diagnosis not present

## 2021-08-10 DIAGNOSIS — Z01818 Encounter for other preprocedural examination: Secondary | ICD-10-CM | POA: Diagnosis not present

## 2021-08-10 DIAGNOSIS — N189 Chronic kidney disease, unspecified: Secondary | ICD-10-CM | POA: Diagnosis not present

## 2021-08-10 DIAGNOSIS — E1129 Type 2 diabetes mellitus with other diabetic kidney complication: Secondary | ICD-10-CM | POA: Diagnosis not present

## 2021-08-10 DIAGNOSIS — E872 Acidosis: Secondary | ICD-10-CM | POA: Diagnosis not present

## 2021-08-10 DIAGNOSIS — E1122 Type 2 diabetes mellitus with diabetic chronic kidney disease: Secondary | ICD-10-CM | POA: Diagnosis not present

## 2021-08-17 DIAGNOSIS — Z96641 Presence of right artificial hip joint: Secondary | ICD-10-CM | POA: Diagnosis not present

## 2021-08-17 DIAGNOSIS — Z471 Aftercare following joint replacement surgery: Secondary | ICD-10-CM | POA: Diagnosis not present

## 2021-08-17 DIAGNOSIS — M25462 Effusion, left knee: Secondary | ICD-10-CM | POA: Diagnosis not present

## 2021-08-17 DIAGNOSIS — E119 Type 2 diabetes mellitus without complications: Secondary | ICD-10-CM | POA: Diagnosis not present

## 2021-08-17 DIAGNOSIS — M1712 Unilateral primary osteoarthritis, left knee: Secondary | ICD-10-CM | POA: Diagnosis not present

## 2021-08-28 DIAGNOSIS — Z23 Encounter for immunization: Secondary | ICD-10-CM | POA: Diagnosis not present

## 2021-08-28 DIAGNOSIS — I129 Hypertensive chronic kidney disease with stage 1 through stage 4 chronic kidney disease, or unspecified chronic kidney disease: Secondary | ICD-10-CM | POA: Diagnosis not present

## 2021-08-28 DIAGNOSIS — E782 Mixed hyperlipidemia: Secondary | ICD-10-CM | POA: Diagnosis not present

## 2021-08-28 DIAGNOSIS — C9210 Chronic myeloid leukemia, BCR/ABL-positive, not having achieved remission: Secondary | ICD-10-CM | POA: Diagnosis not present

## 2021-08-28 DIAGNOSIS — G894 Chronic pain syndrome: Secondary | ICD-10-CM | POA: Diagnosis not present

## 2021-08-28 DIAGNOSIS — E1122 Type 2 diabetes mellitus with diabetic chronic kidney disease: Secondary | ICD-10-CM | POA: Diagnosis not present

## 2021-08-28 DIAGNOSIS — C9111 Chronic lymphocytic leukemia of B-cell type in remission: Secondary | ICD-10-CM | POA: Diagnosis not present

## 2021-08-28 DIAGNOSIS — I10 Essential (primary) hypertension: Secondary | ICD-10-CM | POA: Diagnosis not present

## 2021-09-06 DIAGNOSIS — N1831 Chronic kidney disease, stage 3a: Secondary | ICD-10-CM | POA: Diagnosis not present

## 2021-09-06 DIAGNOSIS — Z794 Long term (current) use of insulin: Secondary | ICD-10-CM | POA: Diagnosis not present

## 2021-09-06 DIAGNOSIS — E1122 Type 2 diabetes mellitus with diabetic chronic kidney disease: Secondary | ICD-10-CM | POA: Diagnosis not present

## 2021-09-06 DIAGNOSIS — E1165 Type 2 diabetes mellitus with hyperglycemia: Secondary | ICD-10-CM | POA: Diagnosis not present

## 2021-09-14 DIAGNOSIS — C9210 Chronic myeloid leukemia, BCR/ABL-positive, not having achieved remission: Secondary | ICD-10-CM | POA: Diagnosis not present

## 2021-09-15 DIAGNOSIS — C9210 Chronic myeloid leukemia, BCR/ABL-positive, not having achieved remission: Secondary | ICD-10-CM | POA: Diagnosis not present

## 2021-10-05 DIAGNOSIS — N189 Chronic kidney disease, unspecified: Secondary | ICD-10-CM | POA: Diagnosis not present

## 2021-10-05 DIAGNOSIS — E1122 Type 2 diabetes mellitus with diabetic chronic kidney disease: Secondary | ICD-10-CM | POA: Diagnosis not present

## 2021-10-05 DIAGNOSIS — I129 Hypertensive chronic kidney disease with stage 1 through stage 4 chronic kidney disease, or unspecified chronic kidney disease: Secondary | ICD-10-CM | POA: Diagnosis not present

## 2021-10-05 DIAGNOSIS — E1129 Type 2 diabetes mellitus with other diabetic kidney complication: Secondary | ICD-10-CM | POA: Diagnosis not present

## 2021-10-05 DIAGNOSIS — R809 Proteinuria, unspecified: Secondary | ICD-10-CM | POA: Diagnosis not present

## 2021-10-12 DIAGNOSIS — E1122 Type 2 diabetes mellitus with diabetic chronic kidney disease: Secondary | ICD-10-CM | POA: Diagnosis not present

## 2021-10-12 DIAGNOSIS — I129 Hypertensive chronic kidney disease with stage 1 through stage 4 chronic kidney disease, or unspecified chronic kidney disease: Secondary | ICD-10-CM | POA: Diagnosis not present

## 2021-10-12 DIAGNOSIS — E1129 Type 2 diabetes mellitus with other diabetic kidney complication: Secondary | ICD-10-CM | POA: Diagnosis not present

## 2021-10-12 DIAGNOSIS — N189 Chronic kidney disease, unspecified: Secondary | ICD-10-CM | POA: Diagnosis not present

## 2021-10-12 DIAGNOSIS — E8722 Chronic metabolic acidosis: Secondary | ICD-10-CM | POA: Diagnosis not present

## 2021-10-12 DIAGNOSIS — R809 Proteinuria, unspecified: Secondary | ICD-10-CM | POA: Diagnosis not present

## 2021-11-29 DIAGNOSIS — Z0000 Encounter for general adult medical examination without abnormal findings: Secondary | ICD-10-CM | POA: Diagnosis not present

## 2021-11-29 DIAGNOSIS — E782 Mixed hyperlipidemia: Secondary | ICD-10-CM | POA: Diagnosis not present

## 2021-11-29 DIAGNOSIS — I10 Essential (primary) hypertension: Secondary | ICD-10-CM | POA: Diagnosis not present

## 2021-11-29 DIAGNOSIS — C9111 Chronic lymphocytic leukemia of B-cell type in remission: Secondary | ICD-10-CM | POA: Diagnosis not present

## 2021-11-29 DIAGNOSIS — G894 Chronic pain syndrome: Secondary | ICD-10-CM | POA: Diagnosis not present

## 2021-11-29 DIAGNOSIS — E1122 Type 2 diabetes mellitus with diabetic chronic kidney disease: Secondary | ICD-10-CM | POA: Diagnosis not present

## 2021-11-29 DIAGNOSIS — E1129 Type 2 diabetes mellitus with other diabetic kidney complication: Secondary | ICD-10-CM | POA: Diagnosis not present

## 2021-12-07 DIAGNOSIS — C9211 Chronic myeloid leukemia, BCR/ABL-positive, in remission: Secondary | ICD-10-CM | POA: Diagnosis not present

## 2021-12-07 DIAGNOSIS — E1122 Type 2 diabetes mellitus with diabetic chronic kidney disease: Secondary | ICD-10-CM | POA: Diagnosis not present

## 2021-12-07 DIAGNOSIS — C9210 Chronic myeloid leukemia, BCR/ABL-positive, not having achieved remission: Secondary | ICD-10-CM | POA: Diagnosis not present

## 2021-12-07 DIAGNOSIS — N1832 Chronic kidney disease, stage 3b: Secondary | ICD-10-CM | POA: Diagnosis not present

## 2021-12-07 DIAGNOSIS — Z79899 Other long term (current) drug therapy: Secondary | ICD-10-CM | POA: Diagnosis not present

## 2021-12-07 DIAGNOSIS — M159 Polyosteoarthritis, unspecified: Secondary | ICD-10-CM | POA: Diagnosis not present

## 2021-12-07 DIAGNOSIS — Z794 Long term (current) use of insulin: Secondary | ICD-10-CM | POA: Diagnosis not present

## 2021-12-08 DIAGNOSIS — C9210 Chronic myeloid leukemia, BCR/ABL-positive, not having achieved remission: Secondary | ICD-10-CM | POA: Diagnosis not present

## 2021-12-12 DIAGNOSIS — E1165 Type 2 diabetes mellitus with hyperglycemia: Secondary | ICD-10-CM | POA: Diagnosis not present

## 2021-12-12 DIAGNOSIS — N1831 Chronic kidney disease, stage 3a: Secondary | ICD-10-CM | POA: Diagnosis not present

## 2021-12-12 DIAGNOSIS — Z794 Long term (current) use of insulin: Secondary | ICD-10-CM | POA: Diagnosis not present

## 2021-12-12 DIAGNOSIS — E1122 Type 2 diabetes mellitus with diabetic chronic kidney disease: Secondary | ICD-10-CM | POA: Diagnosis not present

## 2021-12-14 DIAGNOSIS — I129 Hypertensive chronic kidney disease with stage 1 through stage 4 chronic kidney disease, or unspecified chronic kidney disease: Secondary | ICD-10-CM | POA: Diagnosis not present

## 2021-12-14 DIAGNOSIS — E1129 Type 2 diabetes mellitus with other diabetic kidney complication: Secondary | ICD-10-CM | POA: Diagnosis not present

## 2021-12-14 DIAGNOSIS — E1122 Type 2 diabetes mellitus with diabetic chronic kidney disease: Secondary | ICD-10-CM | POA: Diagnosis not present

## 2021-12-14 DIAGNOSIS — R809 Proteinuria, unspecified: Secondary | ICD-10-CM | POA: Diagnosis not present

## 2021-12-14 DIAGNOSIS — N189 Chronic kidney disease, unspecified: Secondary | ICD-10-CM | POA: Diagnosis not present

## 2021-12-18 ENCOUNTER — Other Ambulatory Visit: Payer: Self-pay

## 2021-12-18 ENCOUNTER — Ambulatory Visit: Payer: Medicare Other | Admitting: Cardiovascular Disease

## 2021-12-18 ENCOUNTER — Encounter: Payer: Self-pay | Admitting: Cardiovascular Disease

## 2021-12-18 DIAGNOSIS — N1832 Chronic kidney disease, stage 3b: Secondary | ICD-10-CM | POA: Diagnosis not present

## 2021-12-18 DIAGNOSIS — C9210 Chronic myeloid leukemia, BCR/ABL-positive, not having achieved remission: Secondary | ICD-10-CM | POA: Diagnosis not present

## 2021-12-18 DIAGNOSIS — E669 Obesity, unspecified: Secondary | ICD-10-CM

## 2021-12-18 DIAGNOSIS — E785 Hyperlipidemia, unspecified: Secondary | ICD-10-CM

## 2021-12-18 DIAGNOSIS — Z01810 Encounter for preprocedural cardiovascular examination: Secondary | ICD-10-CM | POA: Diagnosis not present

## 2021-12-18 DIAGNOSIS — E119 Type 2 diabetes mellitus without complications: Secondary | ICD-10-CM | POA: Diagnosis not present

## 2021-12-18 DIAGNOSIS — I10 Essential (primary) hypertension: Secondary | ICD-10-CM

## 2021-12-18 DIAGNOSIS — Z01818 Encounter for other preprocedural examination: Secondary | ICD-10-CM

## 2021-12-18 NOTE — Progress Notes (Signed)
Patient ID: Dylan Spears., male   DOB: June 27, 1956, 66 y.o.   MRN: [id number]     PCP:  Dr. Hilma Favors   HPI: Dylan Spears., is a 66 y.o. male who presents to the office today for an 80 month cardiology evaluation.   Dylan Spears has a history of CML which was originally diagnosed in 2004, as well as a history of hypertension, hyperlipidemia, and type 2 diabetes mellitus. In 2010 he developed nitrate responsive chest pain which led to cardiac catheterization.  Catheterization revealed normal coronary arteries and normal LV function. In 2013 because of recurrent chest pain symptoms and echo Doppler study as well as Myoview scan were done. With exercise Myoview he exercised to a 9 minute workload. There was a question of borderline mild ischemia in the basal inferior wall. Ejection fraction is 54%. An echo Doppler study showed normal systolic and diastolic function. There was a question of pseudo-dyskinesis of the inferior wall due to increased infradiaphragmatic pressure.   He had mild aortic sclerosis.  When saw him in November 2017  he had noted some mild shortness of breath with significant activity.   Dr. Hilma Favors reduce his amlodipine dose and he now is on amlodipine at 5 mg, Bystolic 5 mg and ramipril 5 mg daily for hypertension, and atorvastatin 20 mg for hyperlipidemia.  He is diabetic on metformin and chest CT but flex, touch insulin.  His CML is in remission and he continues to be on Gleevec.    I  saw him in April 2019.  He admitted to mild weight gain and had not been exercising regularly.   He had laboratory in October 2018 by Dr. Hilma Favors which showed a cholesterol 123, HDL 43, LDL 64, triglycerides 79.  Recent hemoglobin A1c in March 2019 was 7.1. Renal function was mildly impaired at 1.49, but this was stable.  With his 15 pound weight gain, I discussed the American Heart Association recommendations of exercising at least 5 days/week for minimum of 30 minutes of moderate intensity.  I  saw him in July 2020 which time he had remained stable from a cardiovascular perspective.  He specifically denied chest pain or shortness of breath.  His exercise has been limited due to bilateral knee discomfort and a bad back.  He has chronic low back pain, ruptured disc, and spinal stenosis.  He sees Dylan Spears for his kidney disease and apparently underwent an ultrasound-guided renal core biopsy on May 18, 2019.  He was told that the results were normal..  I last saw him in January 2021.  Over the previous 6 months he had issues with diabetes  controlled.  His hemoglobin A1c had increased to 11 but this significantly improved 8.5 most recently in December 2020, followed by Dylan Spears.  He denied chest pain or shortness of breath.  He was no longer on atorvastatin and had been started  on rosuvastatin 20 mg for an LDL of 155.    He was seen in August 2021 by Dylan Deforest, PA for preoperative clearance prior to undergoing  right hip replacement at [facility].  Prior to surgery, his diabetes mellitus was under control he tells me his hemoglobin A1c had decreased to less than seventy before surgery was able to be done.  He tolerated surgery well.  His CML has remained in remission.  He was recently evaluated by GI for mild iron deficiency.  He is scheduled to undergo follow-up colonoscopy in March.  I last saw him on January 05, 2021 at which time he felt well and denied any chest pain or shortness of breath.  He continued to be on amlodipine 10 mg, Bystolic 10 mg and olmesartan 40 mg for hypertension.  He is now on rosuvastatin 40 mg for hyperlipidemia.  For his diabetes he is on Jardiance 10 mg, Humalog insulin, and Ozempic.  He had lost weight and BMI was 32.7.  He was found to have iron deficiency and since had several polyps with plan to undergo a follow-up colonoscopy.  Presently, he continues to feel well.  He is in remission for his CML and has been off medications for approximately 2 years.  He has CKD and  is followed by Dylan Spears of nephrology in [place] with creatinine 1.85.  He is planning to undergo left knee replacement on Apr 02, 2022 at [facility].  He denies chest pain PND orthopnea, presyncope or syncope or palpitations.  He presents for evaluation.   Past Medical History:  Diagnosis Date   Chronic back pain    CML (chronic myelocytic leukemia) ([place])    Diabetes mellitus without complication ([place])    History of stress test    myoview study suggested the possibility of basal inferior wall ischemia which was present in all 3 tonographic views.   Hx of echocardiogram 05/2012   EF 55%Mildly hypertrophic left ventricle with normal systolic function and early signs of mild diastolic dysfuntion. Moderately dilated left atrium, mild aortic valve sclerosis with out stenosis.   Hyperlipemia    Hypertension    Leukemia [facility])     Past Surgical History:  Procedure Laterality Date   BACK SURGERY  12/2006   CARDIAC CATHETERIZATION  2010   revealed mild nonobstructive CAD   CARDIAC CATHETERIZATION  07/2012   This showed essentially normal coronary arteries without obstruction. there was normal LV function.   COLONOSCOPY WITH PROPOFOL N/A 07/14/2018   Procedure: COLONOSCOPY WITH PROPOFOL;  Surgeon: Daneil Dolin, MD;  [facility];  Service: Endoscopy;  Laterality: N/A;  10:45am   COLONOSCOPY WITH PROPOFOL N/A 02/13/2021   Procedure: COLONOSCOPY WITH PROPOFOL;  Surgeon: Daneil Dolin, MD;  [facility];  Service: Endoscopy;  Laterality: N/A;  PM appt   KNEE SURGERY Left 01/1994   LEFT HEART CATHETERIZATION WITH CORONARY ANGIOGRAM N/A 07/29/2012   Procedure: LEFT HEART CATHETERIZATION WITH CORONARY ANGIOGRAM;  Surgeon: Troy Sine, MD;  Location: [facility] CATH LAB;  Service: Cardiovascular;  Laterality: N/A;   POLYPECTOMY  07/14/2018   Procedure: POLYPECTOMY;  Surgeon: Daneil Dolin, MD;  [facility];  Service: Endoscopy;;  colon   POLYPECTOMY  02/13/2021    Procedure: POLYPECTOMY;  Surgeon: Daneil Dolin, MD;  [facility];  Service: Endoscopy;;   TOTAL HIP ARTHROPLASTY Right     No Known Allergies  Current Outpatient Medications  Medication Sig Dispense Refill   ACCU-CHEK AVIVA PLUS test strip USE TO TEST 4 TIMES DAILY. 200 strip 3   Accu-Chek Softclix Lancets lancets USE TO TEST 4 TIMES DAILY. 200 each 3   amLODipine (NORVASC) 10 MG tablet Take 10 mg by mouth daily.     aspirin EC 81 MG tablet Take 81 mg by mouth daily.     BYSTOLIC 10 MG tablet TAKE ONE TABLET BY MOUTH EVERY DAY. (Patient taking differently: Take 10 mg by mouth daily.) 30 tablet 4   empagliflozin (JARDIANCE) 10 MG TABS tablet Take 10 mg by mouth daily.     ferrous sulfate 325 (  65 FE) MG tablet Take 325 mg by mouth 2 (two) times daily.     insulin lispro (HUMALOG) 100 UNIT/ML KwikPen INJECT 20-26 UNITS INTO THE SKIN 3 TIMES DAILY BEFORE MEALS. (Patient taking differently: Inject 10 Units into the skin 3 (three) times daily.) 15 mL 0   Insulin Pen Needle (SURE COMFORT PEN NEEDLES) 31G X 8 MM MISC USING WITH INSULIN INJECTIONS 4X DAILY. 100 each 3   Multiple Vitamin (MULTIVITAMIN WITH MINERALS) TABS Take 1 tablet by mouth daily.     olmesartan (BENICAR) 40 MG tablet Take 40 mg by mouth daily.     Oxycodone HCl 10 MG TABS Take 10 mg by mouth daily as needed (for pain.).      OZEMPIC, 0.25 OR 0.5 MG/DOSE, 2 MG/1.5ML SOPN Inject 0.25 mg into the skin once a week.     rosuvastatin (CRESTOR) 40 MG tablet TAKE ONE TABLET BY MOUTH ONCE DAILY. (Patient taking differently: Take 40 mg by mouth daily.) 90 tablet 0   sodium bicarbonate 650 MG tablet Take 650 mg by mouth 3 (three) times daily.     TRESIBA FLEXTOUCH 200 UNIT/ML FlexTouch Pen INJECT 120 UNITS INTO THE SKIN AT BEDTIME. (Patient taking differently: Inject 70 Units into the skin at bedtime.) 54 mL 0   No current facility-administered medications for this visit.    Social History   Socioeconomic History    Marital status: Married    Spouse name: Not on file   Number of children: Not on file   Years of education: Not on file   Highest education level: Not on file  Occupational History   Not on file  Tobacco Use   Smoking status: Former    Packs/day: 0.25    Years: 15.00    Pack years: 3.75    Types: Cigarettes    Quit date: 07/08/1987    Years since quitting: 34.4   Smokeless tobacco: Never   Tobacco comments:    "30 years ago"  Vaping Use   Vaping Use: Never used  Substance and Sexual Activity   Alcohol use: Yes    Comment: "very seldom"   Drug use: Yes    Types: Marijuana   Sexual activity: Yes    Birth control/protection: None  Other Topics Concern   Not on file  Social History Narrative   Not on file   Social Determinants of Health   Financial Resource Strain: Not on file  Food Insecurity: Not on file  Transportation Needs: Not on file  Physical Activity: Not on file  Stress: Not on file  Social Connections: Not on file  Intimate Partner Violence: Not on file    Family History  Problem Relation Age of Onset   Colon cancer Mother 60   Cancer Father        Unsure of what type; not colon CA   Social history is notable in that he is married. He has 2 children. There is no tobacco use. He quit at age 44. He does not routinely exercise. He does note some fatigue. He rarely drinks alcohol.  ROS General: Negative; No fevers, chills, or night sweats;  HEENT: Negative; No changes in vision or hearing, sinus congestion, difficulty swallowing Pulmonary: Negative; No cough, wheezing, shortness of breath, hemoptysis Cardiovascular: Negative; No chest pain, presyncope, syncope, palpitations GI: Negative; No nausea, vomiting, diarrhea, or abdominal pain GU: Mild erectile dysfunction Musculoskeletal: Bilateral knee discomfort and low back pain; recent right hip replacement at [facility]; plan for left knee replacement  May 2023 Hematologic/Oncology: Positive for CML; stable in  remission Endocrine: Positive for type 2 diabetes mellitus Neuro: Negative; no changes in balance, headaches Skin: Negative; No rashes or skin lesions Psychiatric: Negative; No behavioral problems, depression Sleep: Negative; No snoring, daytime sleepiness, hypersomnolence, bruxism, restless legs, hypnogognic hallucinations, no cataplexy Other comprehensive 14 point system review is negative.  PE BP 130/88    Pulse 91    Ht '6\' 1"'  (1.854 m)    Wt 248 lb 6.4 oz (112.7 kg)    SpO2 99%    BMI 32.77 kg/m    Repeat blood pressure by me 122/80  Wt Readings from Last 3 Encounters:  12/18/21 248 lb 6.4 oz (112.7 kg)  02/09/21 245 lb (111.1 kg)  01/05/21 247 lb 9.6 oz (112.3 kg)   General: Alert, oriented, no distress.  Skin: normal turgor, no rashes, warm and dry HEENT: Normocephalic, atraumatic. Pupils equal round and reactive to light; sclera anicteric; extraocular muscles intact; Nose without nasal septal hypertrophy Mouth/Parynx benign; Mallinpatti scale 2 Neck: No JVD, no carotid bruits; normal carotid upstroke Lungs: clear to ausculatation and percussion; no wheezing or rales Chest wall: without tenderness to palpitation Heart: PMI not displaced, RRR, s1 s2 normal, 1/6 systolic murmur, no diastolic murmur, no rubs, gallops, thrills, or heaves Abdomen: soft, nontender; no hepatosplenomehaly, BS+; abdominal aorta nontender and not dilated by palpation. Back: no CVA tenderness Pulses 2+ Musculoskeletal: full range of motion, normal strength, no joint deformities Extremities: no clubbing cyanosis or edema, Homan's sign negative  Neurologic: grossly nonfocal; Cranial nerves grossly wnl Psychologic: Normal mood and affect    December 18, 2021 ECG (independently read by me):  NSR at 91; LVH by voltage  January 05, 2021 ECG (independently read by me): NSR at 88; LVH; normal intervals  January 2021 ECG (independently read by me): Normal sinus rhythm at 72 bpm.  LVH by voltage criteria.   ST abnormality in lead III, aVF, and V6.  July 2020 ECG (independently read by me): NSR at 62; LVH; T wave abnormality in III  April 2019 ECG (independently read by me): normal sinus rhythm at 68 bpm.  LVH by voltage.  November 2017 ECG (independently read by me): Normal sinus rhythm at 65 bpm.  LVH by voltage.  Normal intervals.  T-wave abnormality in lead 3.  November 2016 ECG (independently read by me): Normal sinus rhythm at 66.  LVH by voltage criteria.  October 2015 ECG (independently read by me): Sinus rhythm at 76 beats per minute.  LVH by voltage criteria in lead aVL  October 2014 ECG: Sinus rhythm. Moderate LVH by voltage criteria. Early repolarization in V2  LABS:  BMP Latest Ref Rng & Units 02/09/2021 03/18/2020 12/17/2019  Glucose 70 - 99 mg/dL 93 220(H) 95  BUN 8 - 23 mg/dL 29(H) 24 21  Creatinine 0.61 - 1.24 mg/dL 1.74(H) 1.45(H) 1.48(H)  BUN/Creat Ratio 6 - 22 (calc) - 17 14  Sodium 135 - 145 mmol/L 139 138 139  Potassium 3.5 - 5.1 mmol/L 5.0 4.6 4.2  Chloride 98 - 111 mmol/L 109 104 102  CO2 22 - 32 mmol/L 20(L) 26 20  Calcium 8.9 - 10.3 mg/dL 9.9 9.6 9.9   Hepatic Function Latest Ref Rng & Units 03/18/2020 12/17/2019 07/29/2019  Total Protein 6.1 - 8.1 g/dL 6.8 6.7 7.0  Albumin 3.8 - 4.8 g/dL - 4.0 -  AST 10 - 35 U/L '18 21 19  ' ALT 9 - 46 U/L '24 21 24  ' Alk Phosphatase 39 -  117 IU/L - 84 -  Total Bilirubin 0.2 - 1.2 mg/dL 0.3 0.3 0.4   CBC Latest Ref Rng & Units 02/09/2021 05/18/2019 07/07/2018  WBC 4.0 - 10.5 K/uL 9.6 11.1(H) 8.4  Hemoglobin 13.0 - 17.0 g/dL 16.2 14.9 12.9(L)  Hematocrit 39.0 - 52.0 % 50.3 44.8 36.9(L)  Platelets 150 - 400 K/uL 283 296 319   Lab Results  Component Value Date   MCV 84.7 02/09/2021   MCV 85.5 05/18/2019   MCV 88.3 07/07/2018            05/01/2009   Lab Results  Component Value Date   HGBA1C 9.2 (A) 03/24/2020  Lipid Panel     Component Value Date/Time   CHOL 136 03/18/2020 0803   CHOL 150 12/17/2019 0917   TRIG 111  03/18/2020 0803   HDL 39 (L) 03/18/2020 0803   HDL 41 12/17/2019 0917   CHOLHDL 3.5 03/18/2020 0803   LDLCALC 78 03/18/2020 0803     RADIOLOGY: No results found.  IMPRESSION: 1. Essential hypertension   2. Pre-op evaluation   3. Hyperlipidemia with target LDL less than 70   4. Stage 3b chronic kidney disease ([place])   5. Controlled type 2 diabetes mellitus without complication, without long-term current use of insulin ([place])   6. Mild obesity   7. CML (chronic myelocytic leukemia) ([place]): In remission      ASSESSMENT AND PLAN: Mr. Deondrea Markos is a 66 year old African-American gentleman gentleman who has a history of hypertension with mild left ventricular hypertrophy on echocardiography and also by ECG criteria.   He has a history of renal insufficiency which has been stable as well as CML, which has been in remission.  He underwent an ultrasound guided renal core biopsy to further evaluate his renal insufficiency and proteinuria ordered by by Dylan Spears and was told that this was okay.  Since Dylan Spears retired he now is followed by Dylan Spears.  Recent creatinine was 1.85 on November 29, 2021 consistent with stage IIIb CKD.  hHs blood pressure today is stable on his regimen of amlodipine 10 mg, olmesartan 40 mg.  He continues to be on Jardiance 10 mg and Ozempic for his diabetes mellitus.  He was found to have iron deficiency anemia and is on ferrous sulfate.  He continues to be on rosuvastatin for hyperlipidemia and is followed by Dr. Sharilyn Sites.  He is followed by Dr. Gae Dry at [facility] for his [facility] and Gleevec was stopped on June 12, 2018.  Over the years she has been successful with weight loss.  BMI today is 32.7 consistent with mild obesity.  He denies any chest pain or palpitations.  His ECG remained stable with sinus rhythm and LVH.  He is to undergo left knee replacement surgery at [facility] in early May 2023.  I have given him clearance to undergo his orthopedic  surgery.  I will see him in 1 year for reevaluation or sooner as needed.   Troy Sine, MD, [facility]  12/25/2021 8:41 AM

## 2021-12-18 NOTE — Patient Instructions (Addendum)
Medication Instructions:   NO CHANGES *If you need a refill on your cardiac medications before your next appointment, please call your pharmacy*   Lab Work: NOT NEEDED If you have labs (blood work) drawn today and your tests are completely normal, you will receive your results only by: [place] (if you have MyChart) OR A paper copy in the mail If you have any lab test that is abnormal or we need to change your treatment, we will call you to review the results.   Testing/Procedures:  NOT NEEDED  Follow-Up: At [facility], you and your health needs are our priority.  As part of our continuing mission to provide you with exceptional heart care, we have created designated Provider Care Teams.  These Care Teams include your primary Cardiologist (physician) and Advanced Practice Providers (APPs -  Physician Assistants and Nurse Practitioners) who all work together to provide you with the care you need, when you need it.     Your next appointment:   12 month(s) 2024  The format for your next appointment:   In Person  Provider:   Shelva Majestic, MD    Other Instructions  MAY  [place].

## 2021-12-21 DIAGNOSIS — E1122 Type 2 diabetes mellitus with diabetic chronic kidney disease: Secondary | ICD-10-CM | POA: Diagnosis not present

## 2021-12-21 DIAGNOSIS — E8722 Chronic metabolic acidosis: Secondary | ICD-10-CM | POA: Diagnosis not present

## 2021-12-21 DIAGNOSIS — R809 Proteinuria, unspecified: Secondary | ICD-10-CM | POA: Diagnosis not present

## 2021-12-21 DIAGNOSIS — E1129 Type 2 diabetes mellitus with other diabetic kidney complication: Secondary | ICD-10-CM | POA: Diagnosis not present

## 2021-12-21 DIAGNOSIS — N189 Chronic kidney disease, unspecified: Secondary | ICD-10-CM | POA: Diagnosis not present

## 2021-12-21 DIAGNOSIS — I129 Hypertensive chronic kidney disease with stage 1 through stage 4 chronic kidney disease, or unspecified chronic kidney disease: Secondary | ICD-10-CM | POA: Diagnosis not present

## 2021-12-25 ENCOUNTER — Encounter: Payer: Self-pay | Admitting: Cardiovascular Disease

## 2022-02-14 DIAGNOSIS — E1169 Type 2 diabetes mellitus with other specified complication: Secondary | ICD-10-CM | POA: Diagnosis not present

## 2022-02-14 DIAGNOSIS — N182 Chronic kidney disease, stage 2 (mild): Secondary | ICD-10-CM | POA: Diagnosis not present

## 2022-02-14 DIAGNOSIS — Z96641 Presence of right artificial hip joint: Secondary | ICD-10-CM | POA: Diagnosis not present

## 2022-02-14 DIAGNOSIS — M25462 Effusion, left knee: Secondary | ICD-10-CM | POA: Diagnosis not present

## 2022-02-14 DIAGNOSIS — M1712 Unilateral primary osteoarthritis, left knee: Secondary | ICD-10-CM | POA: Diagnosis not present

## 2022-02-14 DIAGNOSIS — M1611 Unilateral primary osteoarthritis, right hip: Secondary | ICD-10-CM | POA: Diagnosis not present

## 2022-03-05 DIAGNOSIS — M1712 Unilateral primary osteoarthritis, left knee: Secondary | ICD-10-CM | POA: Diagnosis not present

## 2022-03-05 DIAGNOSIS — C9210 Chronic myeloid leukemia, BCR/ABL-positive, not having achieved remission: Secondary | ICD-10-CM | POA: Diagnosis not present

## 2022-03-05 DIAGNOSIS — I10 Essential (primary) hypertension: Secondary | ICD-10-CM | POA: Diagnosis not present

## 2022-03-05 DIAGNOSIS — M25562 Pain in left knee: Secondary | ICD-10-CM | POA: Diagnosis not present

## 2022-03-05 DIAGNOSIS — G8929 Other chronic pain: Secondary | ICD-10-CM | POA: Diagnosis not present

## 2022-03-05 DIAGNOSIS — E1169 Type 2 diabetes mellitus with other specified complication: Secondary | ICD-10-CM | POA: Diagnosis not present

## 2022-03-05 DIAGNOSIS — Z01818 Encounter for other preprocedural examination: Secondary | ICD-10-CM | POA: Diagnosis not present

## 2022-03-06 DIAGNOSIS — G894 Chronic pain syndrome: Secondary | ICD-10-CM | POA: Diagnosis not present

## 2022-03-06 DIAGNOSIS — C9111 Chronic lymphocytic leukemia of B-cell type in remission: Secondary | ICD-10-CM | POA: Diagnosis not present

## 2022-03-06 DIAGNOSIS — E1140 Type 2 diabetes mellitus with diabetic neuropathy, unspecified: Secondary | ICD-10-CM | POA: Diagnosis not present

## 2022-03-06 DIAGNOSIS — R7989 Other specified abnormal findings of blood chemistry: Secondary | ICD-10-CM | POA: Diagnosis not present

## 2022-03-06 DIAGNOSIS — N1832 Chronic kidney disease, stage 3b: Secondary | ICD-10-CM | POA: Diagnosis not present

## 2022-03-06 DIAGNOSIS — E782 Mixed hyperlipidemia: Secondary | ICD-10-CM | POA: Diagnosis not present

## 2022-03-06 DIAGNOSIS — I10 Essential (primary) hypertension: Secondary | ICD-10-CM | POA: Diagnosis not present

## 2022-03-07 DIAGNOSIS — C9210 Chronic myeloid leukemia, BCR/ABL-positive, not having achieved remission: Secondary | ICD-10-CM | POA: Diagnosis not present

## 2022-03-08 DIAGNOSIS — C9210 Chronic myeloid leukemia, BCR/ABL-positive, not having achieved remission: Secondary | ICD-10-CM | POA: Diagnosis not present

## 2022-03-13 DIAGNOSIS — E1165 Type 2 diabetes mellitus with hyperglycemia: Secondary | ICD-10-CM | POA: Diagnosis not present

## 2022-03-13 DIAGNOSIS — E1122 Type 2 diabetes mellitus with diabetic chronic kidney disease: Secondary | ICD-10-CM | POA: Diagnosis not present

## 2022-03-13 DIAGNOSIS — Z794 Long term (current) use of insulin: Secondary | ICD-10-CM | POA: Diagnosis not present

## 2022-03-13 DIAGNOSIS — C9210 Chronic myeloid leukemia, BCR/ABL-positive, not having achieved remission: Secondary | ICD-10-CM | POA: Diagnosis not present

## 2022-03-13 DIAGNOSIS — E785 Hyperlipidemia, unspecified: Secondary | ICD-10-CM | POA: Diagnosis not present

## 2022-03-13 DIAGNOSIS — N182 Chronic kidney disease, stage 2 (mild): Secondary | ICD-10-CM | POA: Diagnosis not present

## 2022-03-13 DIAGNOSIS — Z7984 Long term (current) use of oral hypoglycemic drugs: Secondary | ICD-10-CM | POA: Diagnosis not present

## 2022-03-13 DIAGNOSIS — Z79899 Other long term (current) drug therapy: Secondary | ICD-10-CM | POA: Diagnosis not present

## 2022-03-13 DIAGNOSIS — Z833 Family history of diabetes mellitus: Secondary | ICD-10-CM | POA: Diagnosis not present

## 2022-03-13 DIAGNOSIS — I129 Hypertensive chronic kidney disease with stage 1 through stage 4 chronic kidney disease, or unspecified chronic kidney disease: Secondary | ICD-10-CM | POA: Diagnosis not present

## 2022-03-14 DIAGNOSIS — E1122 Type 2 diabetes mellitus with diabetic chronic kidney disease: Secondary | ICD-10-CM | POA: Diagnosis not present

## 2022-03-14 DIAGNOSIS — E1129 Type 2 diabetes mellitus with other diabetic kidney complication: Secondary | ICD-10-CM | POA: Diagnosis not present

## 2022-03-14 DIAGNOSIS — R809 Proteinuria, unspecified: Secondary | ICD-10-CM | POA: Diagnosis not present

## 2022-03-14 DIAGNOSIS — E559 Vitamin D deficiency, unspecified: Secondary | ICD-10-CM | POA: Diagnosis not present

## 2022-03-14 DIAGNOSIS — N189 Chronic kidney disease, unspecified: Secondary | ICD-10-CM | POA: Diagnosis not present

## 2022-03-22 DIAGNOSIS — N189 Chronic kidney disease, unspecified: Secondary | ICD-10-CM | POA: Diagnosis not present

## 2022-03-22 DIAGNOSIS — E1129 Type 2 diabetes mellitus with other diabetic kidney complication: Secondary | ICD-10-CM | POA: Diagnosis not present

## 2022-03-22 DIAGNOSIS — E1122 Type 2 diabetes mellitus with diabetic chronic kidney disease: Secondary | ICD-10-CM | POA: Diagnosis not present

## 2022-03-22 DIAGNOSIS — I129 Hypertensive chronic kidney disease with stage 1 through stage 4 chronic kidney disease, or unspecified chronic kidney disease: Secondary | ICD-10-CM | POA: Diagnosis not present

## 2022-03-22 DIAGNOSIS — R809 Proteinuria, unspecified: Secondary | ICD-10-CM | POA: Diagnosis not present

## 2022-03-27 DIAGNOSIS — N182 Chronic kidney disease, stage 2 (mild): Secondary | ICD-10-CM | POA: Diagnosis not present

## 2022-03-27 DIAGNOSIS — C9210 Chronic myeloid leukemia, BCR/ABL-positive, not having achieved remission: Secondary | ICD-10-CM | POA: Diagnosis not present

## 2022-03-27 DIAGNOSIS — E785 Hyperlipidemia, unspecified: Secondary | ICD-10-CM | POA: Diagnosis not present

## 2022-03-27 DIAGNOSIS — Z79899 Other long term (current) drug therapy: Secondary | ICD-10-CM | POA: Diagnosis not present

## 2022-03-27 DIAGNOSIS — Z833 Family history of diabetes mellitus: Secondary | ICD-10-CM | POA: Diagnosis not present

## 2022-03-27 DIAGNOSIS — E1122 Type 2 diabetes mellitus with diabetic chronic kidney disease: Secondary | ICD-10-CM | POA: Diagnosis not present

## 2022-03-27 DIAGNOSIS — I129 Hypertensive chronic kidney disease with stage 1 through stage 4 chronic kidney disease, or unspecified chronic kidney disease: Secondary | ICD-10-CM | POA: Diagnosis not present

## 2022-03-27 DIAGNOSIS — Z794 Long term (current) use of insulin: Secondary | ICD-10-CM | POA: Diagnosis not present

## 2022-03-27 DIAGNOSIS — Z800 Family history of malignant neoplasm of digestive organs: Secondary | ICD-10-CM | POA: Diagnosis not present

## 2022-03-27 DIAGNOSIS — E1165 Type 2 diabetes mellitus with hyperglycemia: Secondary | ICD-10-CM | POA: Diagnosis not present

## 2022-03-27 DIAGNOSIS — E1142 Type 2 diabetes mellitus with diabetic polyneuropathy: Secondary | ICD-10-CM | POA: Diagnosis not present

## 2022-03-27 DIAGNOSIS — Z7984 Long term (current) use of oral hypoglycemic drugs: Secondary | ICD-10-CM | POA: Diagnosis not present

## 2022-04-02 DIAGNOSIS — C9210 Chronic myeloid leukemia, BCR/ABL-positive, not having achieved remission: Secondary | ICD-10-CM | POA: Diagnosis not present

## 2022-04-02 DIAGNOSIS — I129 Hypertensive chronic kidney disease with stage 1 through stage 4 chronic kidney disease, or unspecified chronic kidney disease: Secondary | ICD-10-CM | POA: Diagnosis not present

## 2022-04-02 DIAGNOSIS — Z87891 Personal history of nicotine dependence: Secondary | ICD-10-CM | POA: Diagnosis not present

## 2022-04-02 DIAGNOSIS — G8929 Other chronic pain: Secondary | ICD-10-CM | POA: Diagnosis not present

## 2022-04-02 DIAGNOSIS — Z7985 Long-term (current) use of injectable non-insulin antidiabetic drugs: Secondary | ICD-10-CM | POA: Diagnosis not present

## 2022-04-02 DIAGNOSIS — G8918 Other acute postprocedural pain: Secondary | ICD-10-CM | POA: Diagnosis not present

## 2022-04-02 DIAGNOSIS — Z794 Long term (current) use of insulin: Secondary | ICD-10-CM | POA: Diagnosis not present

## 2022-04-02 DIAGNOSIS — M21162 Varus deformity, not elsewhere classified, left knee: Secondary | ICD-10-CM | POA: Diagnosis not present

## 2022-04-02 DIAGNOSIS — Z79899 Other long term (current) drug therapy: Secondary | ICD-10-CM | POA: Diagnosis not present

## 2022-04-02 DIAGNOSIS — E1122 Type 2 diabetes mellitus with diabetic chronic kidney disease: Secondary | ICD-10-CM | POA: Diagnosis not present

## 2022-04-02 DIAGNOSIS — Z7984 Long term (current) use of oral hypoglycemic drugs: Secondary | ICD-10-CM | POA: Diagnosis not present

## 2022-04-02 DIAGNOSIS — M25762 Osteophyte, left knee: Secondary | ICD-10-CM | POA: Diagnosis not present

## 2022-04-02 DIAGNOSIS — M1712 Unilateral primary osteoarthritis, left knee: Secondary | ICD-10-CM | POA: Diagnosis not present

## 2022-04-02 DIAGNOSIS — N182 Chronic kidney disease, stage 2 (mild): Secondary | ICD-10-CM | POA: Diagnosis not present

## 2022-04-02 DIAGNOSIS — M21262 Flexion deformity, left knee: Secondary | ICD-10-CM | POA: Diagnosis not present

## 2022-04-03 DIAGNOSIS — Z87891 Personal history of nicotine dependence: Secondary | ICD-10-CM | POA: Diagnosis not present

## 2022-04-03 DIAGNOSIS — I129 Hypertensive chronic kidney disease with stage 1 through stage 4 chronic kidney disease, or unspecified chronic kidney disease: Secondary | ICD-10-CM | POA: Diagnosis not present

## 2022-04-03 DIAGNOSIS — C9210 Chronic myeloid leukemia, BCR/ABL-positive, not having achieved remission: Secondary | ICD-10-CM | POA: Diagnosis not present

## 2022-04-03 DIAGNOSIS — G8929 Other chronic pain: Secondary | ICD-10-CM | POA: Diagnosis not present

## 2022-04-03 DIAGNOSIS — Z79899 Other long term (current) drug therapy: Secondary | ICD-10-CM | POA: Diagnosis not present

## 2022-04-03 DIAGNOSIS — Z7985 Long-term (current) use of injectable non-insulin antidiabetic drugs: Secondary | ICD-10-CM | POA: Diagnosis not present

## 2022-04-03 DIAGNOSIS — Z794 Long term (current) use of insulin: Secondary | ICD-10-CM | POA: Diagnosis not present

## 2022-04-03 DIAGNOSIS — M1712 Unilateral primary osteoarthritis, left knee: Secondary | ICD-10-CM | POA: Diagnosis not present

## 2022-04-03 DIAGNOSIS — Z7984 Long term (current) use of oral hypoglycemic drugs: Secondary | ICD-10-CM | POA: Diagnosis not present

## 2022-04-03 DIAGNOSIS — N182 Chronic kidney disease, stage 2 (mild): Secondary | ICD-10-CM | POA: Diagnosis not present

## 2022-04-03 DIAGNOSIS — E1122 Type 2 diabetes mellitus with diabetic chronic kidney disease: Secondary | ICD-10-CM | POA: Diagnosis not present

## 2022-04-10 DIAGNOSIS — Z471 Aftercare following joint replacement surgery: Secondary | ICD-10-CM | POA: Diagnosis not present

## 2022-04-12 DIAGNOSIS — Z471 Aftercare following joint replacement surgery: Secondary | ICD-10-CM | POA: Diagnosis not present

## 2022-04-17 DIAGNOSIS — M25562 Pain in left knee: Secondary | ICD-10-CM | POA: Diagnosis not present

## 2022-04-17 DIAGNOSIS — M2170 Unequal limb length (acquired), unspecified site: Secondary | ICD-10-CM | POA: Diagnosis not present

## 2022-04-17 DIAGNOSIS — Z96652 Presence of left artificial knee joint: Secondary | ICD-10-CM | POA: Diagnosis not present

## 2022-05-08 DIAGNOSIS — Z4789 Encounter for other orthopedic aftercare: Secondary | ICD-10-CM | POA: Diagnosis not present

## 2022-05-08 DIAGNOSIS — M25562 Pain in left knee: Secondary | ICD-10-CM | POA: Diagnosis not present

## 2022-05-08 DIAGNOSIS — M25662 Stiffness of left knee, not elsewhere classified: Secondary | ICD-10-CM | POA: Diagnosis not present

## 2022-05-08 DIAGNOSIS — R2689 Other abnormalities of gait and mobility: Secondary | ICD-10-CM | POA: Diagnosis not present

## 2022-05-08 DIAGNOSIS — R531 Weakness: Secondary | ICD-10-CM | POA: Diagnosis not present

## 2022-05-09 DIAGNOSIS — R531 Weakness: Secondary | ICD-10-CM | POA: Diagnosis not present

## 2022-05-09 DIAGNOSIS — M25662 Stiffness of left knee, not elsewhere classified: Secondary | ICD-10-CM | POA: Diagnosis not present

## 2022-05-09 DIAGNOSIS — M25562 Pain in left knee: Secondary | ICD-10-CM | POA: Diagnosis not present

## 2022-05-09 DIAGNOSIS — R2689 Other abnormalities of gait and mobility: Secondary | ICD-10-CM | POA: Diagnosis not present

## 2022-05-09 DIAGNOSIS — Z4789 Encounter for other orthopedic aftercare: Secondary | ICD-10-CM | POA: Diagnosis not present

## 2022-05-11 DIAGNOSIS — M25562 Pain in left knee: Secondary | ICD-10-CM | POA: Diagnosis not present

## 2022-05-11 DIAGNOSIS — M25662 Stiffness of left knee, not elsewhere classified: Secondary | ICD-10-CM | POA: Diagnosis not present

## 2022-05-11 DIAGNOSIS — Z4789 Encounter for other orthopedic aftercare: Secondary | ICD-10-CM | POA: Diagnosis not present

## 2022-05-11 DIAGNOSIS — R531 Weakness: Secondary | ICD-10-CM | POA: Diagnosis not present

## 2022-05-11 DIAGNOSIS — R2689 Other abnormalities of gait and mobility: Secondary | ICD-10-CM | POA: Diagnosis not present

## 2022-05-14 DIAGNOSIS — R531 Weakness: Secondary | ICD-10-CM | POA: Diagnosis not present

## 2022-05-14 DIAGNOSIS — Z4789 Encounter for other orthopedic aftercare: Secondary | ICD-10-CM | POA: Diagnosis not present

## 2022-05-14 DIAGNOSIS — M25662 Stiffness of left knee, not elsewhere classified: Secondary | ICD-10-CM | POA: Diagnosis not present

## 2022-05-14 DIAGNOSIS — M25562 Pain in left knee: Secondary | ICD-10-CM | POA: Diagnosis not present

## 2022-05-14 DIAGNOSIS — R2689 Other abnormalities of gait and mobility: Secondary | ICD-10-CM | POA: Diagnosis not present

## 2022-05-16 DIAGNOSIS — M25562 Pain in left knee: Secondary | ICD-10-CM | POA: Diagnosis not present

## 2022-05-16 DIAGNOSIS — R531 Weakness: Secondary | ICD-10-CM | POA: Diagnosis not present

## 2022-05-16 DIAGNOSIS — Z4789 Encounter for other orthopedic aftercare: Secondary | ICD-10-CM | POA: Diagnosis not present

## 2022-05-16 DIAGNOSIS — R2689 Other abnormalities of gait and mobility: Secondary | ICD-10-CM | POA: Diagnosis not present

## 2022-05-16 DIAGNOSIS — M25662 Stiffness of left knee, not elsewhere classified: Secondary | ICD-10-CM | POA: Diagnosis not present

## 2022-05-17 DIAGNOSIS — R531 Weakness: Secondary | ICD-10-CM | POA: Diagnosis not present

## 2022-05-17 DIAGNOSIS — M25662 Stiffness of left knee, not elsewhere classified: Secondary | ICD-10-CM | POA: Diagnosis not present

## 2022-05-17 DIAGNOSIS — R2689 Other abnormalities of gait and mobility: Secondary | ICD-10-CM | POA: Diagnosis not present

## 2022-05-17 DIAGNOSIS — M25562 Pain in left knee: Secondary | ICD-10-CM | POA: Diagnosis not present

## 2022-05-17 DIAGNOSIS — Z4789 Encounter for other orthopedic aftercare: Secondary | ICD-10-CM | POA: Diagnosis not present

## 2022-05-21 DIAGNOSIS — R2689 Other abnormalities of gait and mobility: Secondary | ICD-10-CM | POA: Diagnosis not present

## 2022-05-21 DIAGNOSIS — R531 Weakness: Secondary | ICD-10-CM | POA: Diagnosis not present

## 2022-05-21 DIAGNOSIS — M25562 Pain in left knee: Secondary | ICD-10-CM | POA: Diagnosis not present

## 2022-05-21 DIAGNOSIS — Z4789 Encounter for other orthopedic aftercare: Secondary | ICD-10-CM | POA: Diagnosis not present

## 2022-05-21 DIAGNOSIS — M25662 Stiffness of left knee, not elsewhere classified: Secondary | ICD-10-CM | POA: Diagnosis not present

## 2022-05-22 DIAGNOSIS — M25562 Pain in left knee: Secondary | ICD-10-CM | POA: Diagnosis not present

## 2022-05-22 DIAGNOSIS — M25662 Stiffness of left knee, not elsewhere classified: Secondary | ICD-10-CM | POA: Diagnosis not present

## 2022-05-22 DIAGNOSIS — Z4789 Encounter for other orthopedic aftercare: Secondary | ICD-10-CM | POA: Diagnosis not present

## 2022-05-22 DIAGNOSIS — R2689 Other abnormalities of gait and mobility: Secondary | ICD-10-CM | POA: Diagnosis not present

## 2022-05-22 DIAGNOSIS — R531 Weakness: Secondary | ICD-10-CM | POA: Diagnosis not present

## 2022-05-23 DIAGNOSIS — Z96653 Presence of artificial knee joint, bilateral: Secondary | ICD-10-CM | POA: Diagnosis not present

## 2022-05-23 DIAGNOSIS — M25462 Effusion, left knee: Secondary | ICD-10-CM | POA: Diagnosis not present

## 2022-05-24 DIAGNOSIS — E1129 Type 2 diabetes mellitus with other diabetic kidney complication: Secondary | ICD-10-CM | POA: Diagnosis not present

## 2022-05-24 DIAGNOSIS — R809 Proteinuria, unspecified: Secondary | ICD-10-CM | POA: Diagnosis not present

## 2022-05-24 DIAGNOSIS — I129 Hypertensive chronic kidney disease with stage 1 through stage 4 chronic kidney disease, or unspecified chronic kidney disease: Secondary | ICD-10-CM | POA: Diagnosis not present

## 2022-05-24 DIAGNOSIS — N189 Chronic kidney disease, unspecified: Secondary | ICD-10-CM | POA: Diagnosis not present

## 2022-05-24 DIAGNOSIS — E1122 Type 2 diabetes mellitus with diabetic chronic kidney disease: Secondary | ICD-10-CM | POA: Diagnosis not present

## 2022-05-28 DIAGNOSIS — Z4789 Encounter for other orthopedic aftercare: Secondary | ICD-10-CM | POA: Diagnosis not present

## 2022-05-28 DIAGNOSIS — M25662 Stiffness of left knee, not elsewhere classified: Secondary | ICD-10-CM | POA: Diagnosis not present

## 2022-05-28 DIAGNOSIS — R531 Weakness: Secondary | ICD-10-CM | POA: Diagnosis not present

## 2022-05-28 DIAGNOSIS — R2689 Other abnormalities of gait and mobility: Secondary | ICD-10-CM | POA: Diagnosis not present

## 2022-05-28 DIAGNOSIS — M25562 Pain in left knee: Secondary | ICD-10-CM | POA: Diagnosis not present

## 2022-05-30 DIAGNOSIS — Z4789 Encounter for other orthopedic aftercare: Secondary | ICD-10-CM | POA: Diagnosis not present

## 2022-05-30 DIAGNOSIS — M25662 Stiffness of left knee, not elsewhere classified: Secondary | ICD-10-CM | POA: Diagnosis not present

## 2022-05-30 DIAGNOSIS — R531 Weakness: Secondary | ICD-10-CM | POA: Diagnosis not present

## 2022-05-30 DIAGNOSIS — R2689 Other abnormalities of gait and mobility: Secondary | ICD-10-CM | POA: Diagnosis not present

## 2022-05-30 DIAGNOSIS — M25562 Pain in left knee: Secondary | ICD-10-CM | POA: Diagnosis not present

## 2022-05-31 DIAGNOSIS — N189 Chronic kidney disease, unspecified: Secondary | ICD-10-CM | POA: Diagnosis not present

## 2022-05-31 DIAGNOSIS — E1129 Type 2 diabetes mellitus with other diabetic kidney complication: Secondary | ICD-10-CM | POA: Diagnosis not present

## 2022-05-31 DIAGNOSIS — I129 Hypertensive chronic kidney disease with stage 1 through stage 4 chronic kidney disease, or unspecified chronic kidney disease: Secondary | ICD-10-CM | POA: Diagnosis not present

## 2022-05-31 DIAGNOSIS — R809 Proteinuria, unspecified: Secondary | ICD-10-CM | POA: Diagnosis not present

## 2022-05-31 DIAGNOSIS — E1122 Type 2 diabetes mellitus with diabetic chronic kidney disease: Secondary | ICD-10-CM | POA: Diagnosis not present

## 2022-06-04 DIAGNOSIS — M25562 Pain in left knee: Secondary | ICD-10-CM | POA: Diagnosis not present

## 2022-06-04 DIAGNOSIS — Z4789 Encounter for other orthopedic aftercare: Secondary | ICD-10-CM | POA: Diagnosis not present

## 2022-06-04 DIAGNOSIS — R531 Weakness: Secondary | ICD-10-CM | POA: Diagnosis not present

## 2022-06-04 DIAGNOSIS — M25662 Stiffness of left knee, not elsewhere classified: Secondary | ICD-10-CM | POA: Diagnosis not present

## 2022-06-04 DIAGNOSIS — R2689 Other abnormalities of gait and mobility: Secondary | ICD-10-CM | POA: Diagnosis not present

## 2022-06-06 DIAGNOSIS — M25562 Pain in left knee: Secondary | ICD-10-CM | POA: Diagnosis not present

## 2022-06-06 DIAGNOSIS — R2689 Other abnormalities of gait and mobility: Secondary | ICD-10-CM | POA: Diagnosis not present

## 2022-06-06 DIAGNOSIS — Z4789 Encounter for other orthopedic aftercare: Secondary | ICD-10-CM | POA: Diagnosis not present

## 2022-06-06 DIAGNOSIS — R531 Weakness: Secondary | ICD-10-CM | POA: Diagnosis not present

## 2022-06-06 DIAGNOSIS — M25662 Stiffness of left knee, not elsewhere classified: Secondary | ICD-10-CM | POA: Diagnosis not present

## 2022-06-07 DIAGNOSIS — G894 Chronic pain syndrome: Secondary | ICD-10-CM | POA: Diagnosis not present

## 2022-06-07 DIAGNOSIS — E1122 Type 2 diabetes mellitus with diabetic chronic kidney disease: Secondary | ICD-10-CM | POA: Diagnosis not present

## 2022-06-07 DIAGNOSIS — E7849 Other hyperlipidemia: Secondary | ICD-10-CM | POA: Diagnosis not present

## 2022-06-07 DIAGNOSIS — C9111 Chronic lymphocytic leukemia of B-cell type in remission: Secondary | ICD-10-CM | POA: Diagnosis not present

## 2022-06-07 DIAGNOSIS — I129 Hypertensive chronic kidney disease with stage 1 through stage 4 chronic kidney disease, or unspecified chronic kidney disease: Secondary | ICD-10-CM | POA: Diagnosis not present

## 2022-06-07 DIAGNOSIS — E782 Mixed hyperlipidemia: Secondary | ICD-10-CM | POA: Diagnosis not present

## 2022-06-07 DIAGNOSIS — N1832 Chronic kidney disease, stage 3b: Secondary | ICD-10-CM | POA: Diagnosis not present

## 2022-06-07 DIAGNOSIS — C9210 Chronic myeloid leukemia, BCR/ABL-positive, not having achieved remission: Secondary | ICD-10-CM | POA: Diagnosis not present

## 2022-06-07 DIAGNOSIS — I10 Essential (primary) hypertension: Secondary | ICD-10-CM | POA: Diagnosis not present

## 2022-06-11 DIAGNOSIS — M25662 Stiffness of left knee, not elsewhere classified: Secondary | ICD-10-CM | POA: Diagnosis not present

## 2022-06-11 DIAGNOSIS — R531 Weakness: Secondary | ICD-10-CM | POA: Diagnosis not present

## 2022-06-11 DIAGNOSIS — M25562 Pain in left knee: Secondary | ICD-10-CM | POA: Diagnosis not present

## 2022-06-11 DIAGNOSIS — Z4789 Encounter for other orthopedic aftercare: Secondary | ICD-10-CM | POA: Diagnosis not present

## 2022-06-11 DIAGNOSIS — R2689 Other abnormalities of gait and mobility: Secondary | ICD-10-CM | POA: Diagnosis not present

## 2022-06-13 DIAGNOSIS — R2689 Other abnormalities of gait and mobility: Secondary | ICD-10-CM | POA: Diagnosis not present

## 2022-06-13 DIAGNOSIS — R531 Weakness: Secondary | ICD-10-CM | POA: Diagnosis not present

## 2022-06-13 DIAGNOSIS — M25562 Pain in left knee: Secondary | ICD-10-CM | POA: Diagnosis not present

## 2022-06-13 DIAGNOSIS — Z4789 Encounter for other orthopedic aftercare: Secondary | ICD-10-CM | POA: Diagnosis not present

## 2022-06-13 DIAGNOSIS — M25662 Stiffness of left knee, not elsewhere classified: Secondary | ICD-10-CM | POA: Diagnosis not present

## 2022-06-14 DIAGNOSIS — Z794 Long term (current) use of insulin: Secondary | ICD-10-CM | POA: Diagnosis not present

## 2022-06-14 DIAGNOSIS — C9211 Chronic myeloid leukemia, BCR/ABL-positive, in remission: Secondary | ICD-10-CM | POA: Diagnosis not present

## 2022-06-14 DIAGNOSIS — Z96652 Presence of left artificial knee joint: Secondary | ICD-10-CM | POA: Diagnosis not present

## 2022-06-14 DIAGNOSIS — Z28311 Partially vaccinated for covid-19: Secondary | ICD-10-CM | POA: Diagnosis not present

## 2022-06-14 DIAGNOSIS — Z96641 Presence of right artificial hip joint: Secondary | ICD-10-CM | POA: Diagnosis not present

## 2022-06-14 DIAGNOSIS — R809 Proteinuria, unspecified: Secondary | ICD-10-CM | POA: Diagnosis not present

## 2022-06-14 DIAGNOSIS — Z7985 Long-term (current) use of injectable non-insulin antidiabetic drugs: Secondary | ICD-10-CM | POA: Diagnosis not present

## 2022-06-14 DIAGNOSIS — Z96653 Presence of artificial knee joint, bilateral: Secondary | ICD-10-CM | POA: Diagnosis not present

## 2022-06-14 DIAGNOSIS — E1122 Type 2 diabetes mellitus with diabetic chronic kidney disease: Secondary | ICD-10-CM | POA: Diagnosis not present

## 2022-06-14 DIAGNOSIS — N1832 Chronic kidney disease, stage 3b: Secondary | ICD-10-CM | POA: Diagnosis not present

## 2022-06-14 DIAGNOSIS — N269 Renal sclerosis, unspecified: Secondary | ICD-10-CM | POA: Diagnosis not present

## 2022-06-18 DIAGNOSIS — R531 Weakness: Secondary | ICD-10-CM | POA: Diagnosis not present

## 2022-06-18 DIAGNOSIS — R2689 Other abnormalities of gait and mobility: Secondary | ICD-10-CM | POA: Diagnosis not present

## 2022-06-18 DIAGNOSIS — M25562 Pain in left knee: Secondary | ICD-10-CM | POA: Diagnosis not present

## 2022-06-18 DIAGNOSIS — Z4789 Encounter for other orthopedic aftercare: Secondary | ICD-10-CM | POA: Diagnosis not present

## 2022-06-18 DIAGNOSIS — M25662 Stiffness of left knee, not elsewhere classified: Secondary | ICD-10-CM | POA: Diagnosis not present

## 2022-06-20 DIAGNOSIS — M25562 Pain in left knee: Secondary | ICD-10-CM | POA: Diagnosis not present

## 2022-06-20 DIAGNOSIS — M25662 Stiffness of left knee, not elsewhere classified: Secondary | ICD-10-CM | POA: Diagnosis not present

## 2022-06-20 DIAGNOSIS — Z4789 Encounter for other orthopedic aftercare: Secondary | ICD-10-CM | POA: Diagnosis not present

## 2022-06-20 DIAGNOSIS — R531 Weakness: Secondary | ICD-10-CM | POA: Diagnosis not present

## 2022-06-20 DIAGNOSIS — R2689 Other abnormalities of gait and mobility: Secondary | ICD-10-CM | POA: Diagnosis not present

## 2022-06-25 DIAGNOSIS — H25813 Combined forms of age-related cataract, bilateral: Secondary | ICD-10-CM | POA: Diagnosis not present

## 2022-06-25 DIAGNOSIS — Z794 Long term (current) use of insulin: Secondary | ICD-10-CM | POA: Diagnosis not present

## 2022-06-25 DIAGNOSIS — E1136 Type 2 diabetes mellitus with diabetic cataract: Secondary | ICD-10-CM | POA: Diagnosis not present

## 2022-06-27 DIAGNOSIS — M25662 Stiffness of left knee, not elsewhere classified: Secondary | ICD-10-CM | POA: Diagnosis not present

## 2022-06-27 DIAGNOSIS — M25562 Pain in left knee: Secondary | ICD-10-CM | POA: Diagnosis not present

## 2022-06-27 DIAGNOSIS — Z4789 Encounter for other orthopedic aftercare: Secondary | ICD-10-CM | POA: Diagnosis not present

## 2022-06-27 DIAGNOSIS — R2689 Other abnormalities of gait and mobility: Secondary | ICD-10-CM | POA: Diagnosis not present

## 2022-06-27 DIAGNOSIS — R531 Weakness: Secondary | ICD-10-CM | POA: Diagnosis not present

## 2022-07-04 DIAGNOSIS — R531 Weakness: Secondary | ICD-10-CM | POA: Diagnosis not present

## 2022-07-04 DIAGNOSIS — M25562 Pain in left knee: Secondary | ICD-10-CM | POA: Diagnosis not present

## 2022-07-04 DIAGNOSIS — Z4789 Encounter for other orthopedic aftercare: Secondary | ICD-10-CM | POA: Diagnosis not present

## 2022-07-04 DIAGNOSIS — R2689 Other abnormalities of gait and mobility: Secondary | ICD-10-CM | POA: Diagnosis not present

## 2022-07-04 DIAGNOSIS — M25662 Stiffness of left knee, not elsewhere classified: Secondary | ICD-10-CM | POA: Diagnosis not present

## 2022-07-31 DIAGNOSIS — Z794 Long term (current) use of insulin: Secondary | ICD-10-CM | POA: Diagnosis not present

## 2022-07-31 DIAGNOSIS — E1165 Type 2 diabetes mellitus with hyperglycemia: Secondary | ICD-10-CM | POA: Diagnosis not present

## 2022-08-29 DIAGNOSIS — E1122 Type 2 diabetes mellitus with diabetic chronic kidney disease: Secondary | ICD-10-CM | POA: Diagnosis not present

## 2022-08-29 DIAGNOSIS — R809 Proteinuria, unspecified: Secondary | ICD-10-CM | POA: Diagnosis not present

## 2022-08-29 DIAGNOSIS — N189 Chronic kidney disease, unspecified: Secondary | ICD-10-CM | POA: Diagnosis not present

## 2022-08-29 DIAGNOSIS — I129 Hypertensive chronic kidney disease with stage 1 through stage 4 chronic kidney disease, or unspecified chronic kidney disease: Secondary | ICD-10-CM | POA: Diagnosis not present

## 2022-08-29 DIAGNOSIS — E1129 Type 2 diabetes mellitus with other diabetic kidney complication: Secondary | ICD-10-CM | POA: Diagnosis not present

## 2022-09-06 DIAGNOSIS — I129 Hypertensive chronic kidney disease with stage 1 through stage 4 chronic kidney disease, or unspecified chronic kidney disease: Secondary | ICD-10-CM | POA: Diagnosis not present

## 2022-09-06 DIAGNOSIS — R809 Proteinuria, unspecified: Secondary | ICD-10-CM | POA: Diagnosis not present

## 2022-09-06 DIAGNOSIS — E1129 Type 2 diabetes mellitus with other diabetic kidney complication: Secondary | ICD-10-CM | POA: Diagnosis not present

## 2022-09-06 DIAGNOSIS — E1122 Type 2 diabetes mellitus with diabetic chronic kidney disease: Secondary | ICD-10-CM | POA: Diagnosis not present

## 2022-09-06 DIAGNOSIS — N189 Chronic kidney disease, unspecified: Secondary | ICD-10-CM | POA: Diagnosis not present

## 2022-09-06 DIAGNOSIS — N170 Acute kidney failure with tubular necrosis: Secondary | ICD-10-CM | POA: Diagnosis not present

## 2022-10-01 DIAGNOSIS — N170 Acute kidney failure with tubular necrosis: Secondary | ICD-10-CM | POA: Diagnosis not present

## 2022-10-01 DIAGNOSIS — E1122 Type 2 diabetes mellitus with diabetic chronic kidney disease: Secondary | ICD-10-CM | POA: Diagnosis not present

## 2022-10-01 DIAGNOSIS — R809 Proteinuria, unspecified: Secondary | ICD-10-CM | POA: Diagnosis not present

## 2022-10-01 DIAGNOSIS — N189 Chronic kidney disease, unspecified: Secondary | ICD-10-CM | POA: Diagnosis not present

## 2022-10-01 DIAGNOSIS — E1129 Type 2 diabetes mellitus with other diabetic kidney complication: Secondary | ICD-10-CM | POA: Diagnosis not present

## 2022-10-15 DIAGNOSIS — I10 Essential (primary) hypertension: Secondary | ICD-10-CM | POA: Diagnosis not present

## 2022-10-15 DIAGNOSIS — K219 Gastro-esophageal reflux disease without esophagitis: Secondary | ICD-10-CM | POA: Diagnosis not present

## 2022-10-15 DIAGNOSIS — C9111 Chronic lymphocytic leukemia of B-cell type in remission: Secondary | ICD-10-CM | POA: Diagnosis not present

## 2022-10-15 DIAGNOSIS — E782 Mixed hyperlipidemia: Secondary | ICD-10-CM | POA: Diagnosis not present

## 2022-10-15 DIAGNOSIS — I129 Hypertensive chronic kidney disease with stage 1 through stage 4 chronic kidney disease, or unspecified chronic kidney disease: Secondary | ICD-10-CM | POA: Diagnosis not present

## 2022-10-15 DIAGNOSIS — E1122 Type 2 diabetes mellitus with diabetic chronic kidney disease: Secondary | ICD-10-CM | POA: Diagnosis not present

## 2022-10-15 DIAGNOSIS — C9210 Chronic myeloid leukemia, BCR/ABL-positive, not having achieved remission: Secondary | ICD-10-CM | POA: Diagnosis not present

## 2022-10-15 DIAGNOSIS — G894 Chronic pain syndrome: Secondary | ICD-10-CM | POA: Diagnosis not present

## 2022-10-20 DIAGNOSIS — E871 Hypo-osmolality and hyponatremia: Secondary | ICD-10-CM | POA: Diagnosis not present

## 2022-10-20 DIAGNOSIS — E1129 Type 2 diabetes mellitus with other diabetic kidney complication: Secondary | ICD-10-CM | POA: Diagnosis not present

## 2022-10-20 DIAGNOSIS — R809 Proteinuria, unspecified: Secondary | ICD-10-CM | POA: Diagnosis not present

## 2022-10-20 DIAGNOSIS — I129 Hypertensive chronic kidney disease with stage 1 through stage 4 chronic kidney disease, or unspecified chronic kidney disease: Secondary | ICD-10-CM | POA: Diagnosis not present

## 2022-10-20 DIAGNOSIS — N170 Acute kidney failure with tubular necrosis: Secondary | ICD-10-CM | POA: Diagnosis not present

## 2022-10-20 DIAGNOSIS — E1122 Type 2 diabetes mellitus with diabetic chronic kidney disease: Secondary | ICD-10-CM | POA: Diagnosis not present

## 2022-10-20 DIAGNOSIS — N189 Chronic kidney disease, unspecified: Secondary | ICD-10-CM | POA: Diagnosis not present

## 2022-10-24 DIAGNOSIS — M1711 Unilateral primary osteoarthritis, right knee: Secondary | ICD-10-CM | POA: Diagnosis not present

## 2022-10-24 DIAGNOSIS — Z96652 Presence of left artificial knee joint: Secondary | ICD-10-CM | POA: Diagnosis not present

## 2022-10-24 DIAGNOSIS — M1712 Unilateral primary osteoarthritis, left knee: Secondary | ICD-10-CM | POA: Diagnosis not present

## 2022-10-24 DIAGNOSIS — Z471 Aftercare following joint replacement surgery: Secondary | ICD-10-CM | POA: Diagnosis not present

## 2022-12-13 DIAGNOSIS — E785 Hyperlipidemia, unspecified: Secondary | ICD-10-CM | POA: Diagnosis not present

## 2022-12-13 DIAGNOSIS — Z96652 Presence of left artificial knee joint: Secondary | ICD-10-CM | POA: Diagnosis not present

## 2022-12-13 DIAGNOSIS — Z7985 Long-term (current) use of injectable non-insulin antidiabetic drugs: Secondary | ICD-10-CM | POA: Diagnosis not present

## 2022-12-13 DIAGNOSIS — I129 Hypertensive chronic kidney disease with stage 1 through stage 4 chronic kidney disease, or unspecified chronic kidney disease: Secondary | ICD-10-CM | POA: Diagnosis not present

## 2022-12-13 DIAGNOSIS — Z7984 Long term (current) use of oral hypoglycemic drugs: Secondary | ICD-10-CM | POA: Diagnosis not present

## 2022-12-13 DIAGNOSIS — M1712 Unilateral primary osteoarthritis, left knee: Secondary | ICD-10-CM | POA: Diagnosis not present

## 2022-12-13 DIAGNOSIS — C9211 Chronic myeloid leukemia, BCR/ABL-positive, in remission: Secondary | ICD-10-CM | POA: Diagnosis not present

## 2022-12-13 DIAGNOSIS — Z96641 Presence of right artificial hip joint: Secondary | ICD-10-CM | POA: Diagnosis not present

## 2022-12-13 DIAGNOSIS — Z794 Long term (current) use of insulin: Secondary | ICD-10-CM | POA: Diagnosis not present

## 2022-12-13 DIAGNOSIS — Z79899 Other long term (current) drug therapy: Secondary | ICD-10-CM | POA: Diagnosis not present

## 2022-12-13 DIAGNOSIS — E1122 Type 2 diabetes mellitus with diabetic chronic kidney disease: Secondary | ICD-10-CM | POA: Diagnosis not present

## 2022-12-13 DIAGNOSIS — N1832 Chronic kidney disease, stage 3b: Secondary | ICD-10-CM | POA: Diagnosis not present

## 2022-12-14 DIAGNOSIS — C9211 Chronic myeloid leukemia, BCR/ABL-positive, in remission: Secondary | ICD-10-CM | POA: Diagnosis not present

## 2022-12-19 DIAGNOSIS — E1129 Type 2 diabetes mellitus with other diabetic kidney complication: Secondary | ICD-10-CM | POA: Diagnosis not present

## 2022-12-19 DIAGNOSIS — E1122 Type 2 diabetes mellitus with diabetic chronic kidney disease: Secondary | ICD-10-CM | POA: Diagnosis not present

## 2022-12-19 DIAGNOSIS — N189 Chronic kidney disease, unspecified: Secondary | ICD-10-CM | POA: Diagnosis not present

## 2022-12-19 DIAGNOSIS — R809 Proteinuria, unspecified: Secondary | ICD-10-CM | POA: Diagnosis not present

## 2022-12-19 DIAGNOSIS — N170 Acute kidney failure with tubular necrosis: Secondary | ICD-10-CM | POA: Diagnosis not present

## 2023-01-04 DIAGNOSIS — E1129 Type 2 diabetes mellitus with other diabetic kidney complication: Secondary | ICD-10-CM | POA: Diagnosis not present

## 2023-01-04 DIAGNOSIS — I129 Hypertensive chronic kidney disease with stage 1 through stage 4 chronic kidney disease, or unspecified chronic kidney disease: Secondary | ICD-10-CM | POA: Diagnosis not present

## 2023-01-04 DIAGNOSIS — E1122 Type 2 diabetes mellitus with diabetic chronic kidney disease: Secondary | ICD-10-CM | POA: Diagnosis not present

## 2023-01-04 DIAGNOSIS — E871 Hypo-osmolality and hyponatremia: Secondary | ICD-10-CM | POA: Diagnosis not present

## 2023-01-04 DIAGNOSIS — R809 Proteinuria, unspecified: Secondary | ICD-10-CM | POA: Diagnosis not present

## 2023-01-04 DIAGNOSIS — N170 Acute kidney failure with tubular necrosis: Secondary | ICD-10-CM | POA: Diagnosis not present

## 2023-01-04 DIAGNOSIS — N189 Chronic kidney disease, unspecified: Secondary | ICD-10-CM | POA: Diagnosis not present

## 2023-01-21 DIAGNOSIS — I129 Hypertensive chronic kidney disease with stage 1 through stage 4 chronic kidney disease, or unspecified chronic kidney disease: Secondary | ICD-10-CM | POA: Diagnosis not present

## 2023-01-21 DIAGNOSIS — E782 Mixed hyperlipidemia: Secondary | ICD-10-CM | POA: Diagnosis not present

## 2023-01-21 DIAGNOSIS — E1122 Type 2 diabetes mellitus with diabetic chronic kidney disease: Secondary | ICD-10-CM | POA: Diagnosis not present

## 2023-01-21 DIAGNOSIS — C9210 Chronic myeloid leukemia, BCR/ABL-positive, not having achieved remission: Secondary | ICD-10-CM | POA: Diagnosis not present

## 2023-01-21 DIAGNOSIS — G894 Chronic pain syndrome: Secondary | ICD-10-CM | POA: Diagnosis not present

## 2023-01-21 DIAGNOSIS — N1832 Chronic kidney disease, stage 3b: Secondary | ICD-10-CM | POA: Diagnosis not present

## 2023-01-21 DIAGNOSIS — I10 Essential (primary) hypertension: Secondary | ICD-10-CM | POA: Diagnosis not present

## 2023-01-21 DIAGNOSIS — E1140 Type 2 diabetes mellitus with diabetic neuropathy, unspecified: Secondary | ICD-10-CM | POA: Diagnosis not present

## 2023-04-17 DIAGNOSIS — Z0000 Encounter for general adult medical examination without abnormal findings: Secondary | ICD-10-CM | POA: Diagnosis not present

## 2023-04-17 DIAGNOSIS — R809 Proteinuria, unspecified: Secondary | ICD-10-CM | POA: Diagnosis not present

## 2023-04-17 DIAGNOSIS — N1832 Chronic kidney disease, stage 3b: Secondary | ICD-10-CM | POA: Diagnosis not present

## 2023-04-17 DIAGNOSIS — G894 Chronic pain syndrome: Secondary | ICD-10-CM | POA: Diagnosis not present

## 2023-04-17 DIAGNOSIS — D638 Anemia in other chronic diseases classified elsewhere: Secondary | ICD-10-CM | POA: Diagnosis not present

## 2023-04-17 DIAGNOSIS — Z79899 Other long term (current) drug therapy: Secondary | ICD-10-CM | POA: Diagnosis not present

## 2023-04-18 DIAGNOSIS — Z794 Long term (current) use of insulin: Secondary | ICD-10-CM | POA: Diagnosis not present

## 2023-04-18 DIAGNOSIS — E1165 Type 2 diabetes mellitus with hyperglycemia: Secondary | ICD-10-CM | POA: Diagnosis not present

## 2023-04-23 DIAGNOSIS — R809 Proteinuria, unspecified: Secondary | ICD-10-CM | POA: Diagnosis not present

## 2023-04-23 DIAGNOSIS — E1129 Type 2 diabetes mellitus with other diabetic kidney complication: Secondary | ICD-10-CM | POA: Diagnosis not present

## 2023-04-23 DIAGNOSIS — N269 Renal sclerosis, unspecified: Secondary | ICD-10-CM | POA: Diagnosis not present

## 2023-04-23 DIAGNOSIS — N1832 Chronic kidney disease, stage 3b: Secondary | ICD-10-CM | POA: Diagnosis not present

## 2023-04-30 ENCOUNTER — Ambulatory Visit: Payer: Medicare Other | Attending: Physician Assistant | Admitting: Physician Assistant

## 2023-04-30 ENCOUNTER — Encounter: Payer: Self-pay | Admitting: Physician Assistant

## 2023-04-30 VITALS — BP 116/74 | HR 82 | Ht 73.0 in | Wt 241.0 lb

## 2023-04-30 DIAGNOSIS — E119 Type 2 diabetes mellitus without complications: Secondary | ICD-10-CM

## 2023-04-30 DIAGNOSIS — Z7985 Long-term (current) use of injectable non-insulin antidiabetic drugs: Secondary | ICD-10-CM

## 2023-04-30 DIAGNOSIS — N183 Chronic kidney disease, stage 3 unspecified: Secondary | ICD-10-CM | POA: Diagnosis not present

## 2023-04-30 DIAGNOSIS — E785 Hyperlipidemia, unspecified: Secondary | ICD-10-CM | POA: Diagnosis not present

## 2023-04-30 DIAGNOSIS — I10 Essential (primary) hypertension: Secondary | ICD-10-CM | POA: Diagnosis not present

## 2023-04-30 NOTE — Patient Instructions (Signed)
Medication Instructions:  No changes *If you need a refill on your cardiac medications before your next appointment, please call your pharmacy*   Lab Work: No Labs If you have labs (blood work) drawn today and your tests are completely normal, you will receive your results only by: MyChart Message (if you have MyChart) OR A paper copy in the mail If you have any lab test that is abnormal or we need to change your treatment, we will call you to review the results.   Testing/Procedures: No Testing   Follow-Up: At [facility], you and your health needs are our priority.  As part of our continuing mission to provide you with exceptional heart care, we have created designated Provider Care Teams.  These Care Teams include your primary Cardiologist (physician) and Advanced Practice Providers (APPs -  Physician Assistants and Nurse Practitioners) who all work together to provide you with the care you need, when you need it.  We recommend signing up for the patient portal called "MyChart".  Sign up information is provided on this After Visit Summary.  MyChart is used to connect with patients for Virtual Visits (Telemedicine).  Patients are able to view lab/test results, encounter notes, upcoming appointments, etc.  Non-urgent messages can be sent to your provider as well.   To learn more about what you can do with MyChart, go to [email or web address].    Your next appointment:   1 year(s)  Provider:   Nicki Gu[place], MD

## 2023-04-30 NOTE — Progress Notes (Unsigned)
Cardiology Office Note:    Date:  05/02/2023   ID:  Dylan Spears., DOB 23-Mar-1956, MRN [id number]  PCP:  Assunta Found, MD   [facility] Providers Cardiologist:  Nicki Guadalajara, MD     Referring MD: Assunta Found, MD   Chief Complaint  Patient presents with   Follow-up    Seen for Dr. Tresa Endo    History of Present Illness:    Dylan Spears. is a 67 y.o. male with a hx of hypertension, hyperlipidemia, DM 2, CKD, CML diagnosed in 2004, and chronic back pain.  He developed nitro responsive chest pain in 2010 and subsequent cardiac catheterization revealed normal coronary arteries with normal LV function.  Due to recurrent chest discomfort in 2013, he had repeat Myoview and was able to accomplish 9 METS of workload, there was a question of borderline mild ischemia in the basal inferior wall, EF 54%.  Echocardiogram showed normal EF with normal diastolic function, mild aortic sclerosis.  He had a previous proteinuria in the urine and underwent renal biopsy which showed diabetic glomerulosclerosis.  This is being followed by nephrology Dr. Kristian Covey.  I last saw the patient in August 2021 for preop clearance, his CML was in remission at the time.  He was last seen by Dr. Tresa Endo in January 2023 at which time he remained in remission for his CML and is being followed by Dr. Wolfgang Phoenix of nephrology service in [place] with baseline creatinine of 1.85.  Patient presents today for follow-up.  Most recent creatinine was 1.78 in January 2024.  Recently, his hemoglobin A1c jumped up from 7.4 in September of last year to 9.5 on 04/18/2023.  This is being addressed by his endocrine allergist.  He denies any exertional chest pain or worsening dyspnea.  He is a companied by his wife.  He has no lower extremity edema, orthopnea or PND.  Blood pressure is very well-controlled.  He can follow-up in 1 year.  Past Medical History:  Diagnosis Date   Chronic back pain    CML (chronic myelocytic  leukemia) (HCC)    Diabetes mellitus without complication (HCC)    History of stress test    myoview study suggested the possibility of basal inferior wall ischemia which was present in all 3 tonographic views.   Hx of echocardiogram 05/2012   EF 55%Mildly hypertrophic left ventricle with normal systolic function and early signs of mild diastolic dysfuntion. Moderately dilated left atrium, mild aortic valve sclerosis with out stenosis.   Hyperlipemia    Hypertension    Leukemia [facility])     Past Surgical History:  Procedure Laterality Date   BACK SURGERY  12/2006   CARDIAC CATHETERIZATION  2010   revealed mild nonobstructive CAD   CARDIAC CATHETERIZATION  07/2012   This showed essentially normal coronary arteries without obstruction. there was normal LV function.   COLONOSCOPY WITH PROPOFOL N/A 07/14/2018   Procedure: COLONOSCOPY WITH PROPOFOL;  Surgeon: Corbin Ade, MD;  [facility];  Service: Endoscopy;  Laterality: N/A;  10:45am   COLONOSCOPY WITH PROPOFOL N/A 02/13/2021   Procedure: COLONOSCOPY WITH PROPOFOL;  Surgeon: Corbin Ade, MD;  [facility];  Service: Endoscopy;  Laterality: N/A;  PM appt   KNEE SURGERY Left 01/1994   LEFT HEART CATHETERIZATION WITH CORONARY ANGIOGRAM N/A 07/29/2012   Procedure: LEFT HEART CATHETERIZATION WITH CORONARY ANGIOGRAM;  Surgeon: Lennette Bihari, MD;  Location: [facility] CATH LAB;  Service: Cardiovascular;  Laterality: N/A;   POLYPECTOMY  07/14/2018   Procedure: POLYPECTOMY;  Surgeon: Corbin Ade, MD;  [facility];  Service: Endoscopy;;  colon   POLYPECTOMY  02/13/2021   Procedure: POLYPECTOMY;  Surgeon: Corbin Ade, MD;  [facility];  Service: Endoscopy;;   TOTAL HIP ARTHROPLASTY Right     Current Medications: Current Meds  Medication Sig   ACCU-CHEK AVIVA PLUS test strip USE TO TEST 4 TIMES DAILY.   Accu-Chek Softclix Lancets lancets USE TO TEST 4 TIMES DAILY.   amLODipine (NORVASC) 10 MG  tablet Take 10 mg by mouth daily.   aspirin EC 81 MG tablet Take 81 mg by mouth daily.   BYSTOLIC 10 MG tablet TAKE ONE TABLET BY MOUTH EVERY DAY. (Patient taking differently: Take 10 mg by mouth daily.)   empagliflozin (JARDIANCE) 10 MG TABS tablet Take 10 mg by mouth every other day. Take one tablet by mouth every other day.   ferrous sulfate 325 (65 FE) MG tablet Take 325 mg by mouth 2 (two) times daily.   Insulin Pen Needle (SURE COMFORT PEN NEEDLES) 31G X 8 MM MISC USING WITH INSULIN INJECTIONS 4X DAILY.   Multiple Vitamin (MULTIVITAMIN WITH MINERALS) TABS Take 1 tablet by mouth daily.   olmesartan (BENICAR) 40 MG tablet Take 40 mg by mouth daily.   Oxycodone HCl 10 MG TABS Take 10 mg by mouth daily as needed (for pain.).    OZEMPIC, 0.25 OR 0.5 MG/DOSE, 2 MG/1.5ML SOPN Inject 0.25 mg into the skin once a week.   rosuvastatin (CRESTOR) 40 MG tablet TAKE ONE TABLET BY MOUTH ONCE DAILY. (Patient taking differently: Take 40 mg by mouth daily.)   TRESIBA FLEXTOUCH 200 UNIT/ML FlexTouch Pen INJECT 120 UNITS INTO THE SKIN AT BEDTIME. (Patient taking differently: Inject 70 Units into the skin at bedtime.)     Allergies:   Patient has no known allergies.   Social History   Socioeconomic History   Marital status: Married    Spouse name: Not on file   Number of children: Not on file   Years of education: Not on file   Highest education level: Not on file  Occupational History   Not on file  Tobacco Use   Smoking status: Former    Packs/day: 0.25    Years: 15.00    Additional pack years: 0.00    Total pack years: 3.75    Types: Cigarettes    Quit date: 07/08/1987    Years since quitting: 35.8   Smokeless tobacco: Never   Tobacco comments:    "30 years ago"  Vaping Use   Vaping Use: Never used  Substance and Sexual Activity   Alcohol use: Yes    Comment: "very seldom"   Drug use: Yes    Types: Marijuana   Sexual activity: Yes    Birth control/protection: None  Other Topics  Concern   Not on file  Social History Narrative   Not on file   Social Determinants of Health   Financial Resource Strain: Not on file  Food Insecurity: Not on file  Transportation Needs: Not on file  Physical Activity: Not on file  Stress: Not on file  Social Connections: Not on file     Family History: The patient's family history includes Cancer in his father; Colon cancer (age of onset: 64) in his mother.  ROS:   Please see the history of present illness.     All other systems reviewed and are negative.  EKGs/Labs/Other Studies Reviewed:  The following studies were reviewed today:  N/A  EKG:  EKG is ordered today.  The ekg ordered today demonstrates normal sinus rhythm, no significant ST-T wave changes.  Recent Labs: No results found for requested labs within last 365 days.  Recent Lipid Panel    Component Value Date/Time   CHOL 136 03/18/2020 0803   CHOL 150 12/17/2019 0917   TRIG 111 03/18/2020 0803   HDL 39 (L) 03/18/2020 0803   HDL 41 12/17/2019 0917   CHOLHDL 3.5 03/18/2020 0803   LDLCALC 78 03/18/2020 0803     Risk Assessment/Calculations:           Physical Exam:    VS:  BP 116/74   Pulse 82   Ht 6\' 1"  (1.854 m)   Wt 241 lb (109.3 kg)   SpO2 97%   BMI 31.80 kg/m        Wt Readings from Last 3 Encounters:  04/30/23 241 lb (109.3 kg)  12/18/21 248 lb 6.4 oz (112.7 kg)  02/09/21 245 lb (111.1 kg)     GEN:  Well nourished, well developed in no acute distress HEENT: Normal NECK: No JVD; No carotid bruits LYMPHATICS: No lymphadenopathy CARDIAC: RRR, no murmurs, rubs, gallops RESPIRATORY:  Clear to auscultation without rales, wheezing or rhonchi  ABDOMEN: Soft, non-tender, non-distended MUSCULOSKELETAL:  No edema; No deformity  SKIN: Warm and dry NEUROLOGIC:  Alert and oriented x 3 PSYCHIATRIC:  Normal affect   ASSESSMENT:    1. Essential hypertension   2. Hyperlipidemia with target LDL less than 70   3. Controlled type 2  diabetes mellitus without complication, without long-term current use of insulin (HCC)   4. Stage 3 chronic kidney disease, unspecified whether stage 3a or 3b CKD (HCC)    PLAN:    In order of problems listed above:  Hypertension: Blood pressure stable.  Continue amlodipine and olmesartan.  Hyperlipidemia: On rosuvastatin  DM2: Managed by primary care provider  CKD stage III: Followed by Dr. Wolfgang Phoenix.           Medication Adjustments/Labs and Tests Ordered: Current medicines are reviewed at length with the patient today.  Concerns regarding medicines are outlined above.  Orders Placed This Encounter  Procedures   EKG 12-Lead   No orders of the defined types were placed in this encounter.   Patient Instructions  Medication Instructions:  No changes *If you need a refill on your cardiac medications before your next appointment, please call your pharmacy*   Lab Work: No Labs If you have labs (blood work) drawn today and your tests are completely normal, you will receive your results only by: MyChart Message (if you have MyChart) OR A paper copy in the mail If you have any lab test that is abnormal or we need to change your treatment, we will call you to review the results.   Testing/Procedures: No Testing   Follow-Up: At [facility], you and your health needs are our priority.  As part of our continuing mission to provide you with exceptional heart care, we have created designated Provider Care Teams.  These Care Teams include your primary Cardiologist (physician) and Advanced Practice Providers (APPs -  Physician Assistants and Nurse Practitioners) who all work together to provide you with the care you need, when you need it.  We recommend signing up for the patient portal called "MyChart".  Sign up information is provided on this After Visit Summary.  MyChart is used to connect with patients for Virtual  Visits (Telemedicine).  Patients are able to view  lab/test results, encounter notes, upcoming appointments, etc.  Non-urgent messages can be sent to your provider as well.   To learn more about what you can do with MyChart, go to [email or web address].    Your next appointment:   1 year(s)  Provider:   Nicki Guadalajara, MD     Signed, Azalee Course, [place]  05/02/2023 3:22 PM    [facility]

## 2023-05-02 ENCOUNTER — Encounter: Payer: Self-pay | Admitting: Physician Assistant

## 2023-07-04 DIAGNOSIS — C9211 Chronic myeloid leukemia, BCR/ABL-positive, in remission: Secondary | ICD-10-CM | POA: Diagnosis not present

## 2023-07-18 DIAGNOSIS — E1129 Type 2 diabetes mellitus with other diabetic kidney complication: Secondary | ICD-10-CM | POA: Diagnosis not present

## 2023-07-18 DIAGNOSIS — E782 Mixed hyperlipidemia: Secondary | ICD-10-CM | POA: Diagnosis not present

## 2023-07-18 DIAGNOSIS — I129 Hypertensive chronic kidney disease with stage 1 through stage 4 chronic kidney disease, or unspecified chronic kidney disease: Secondary | ICD-10-CM | POA: Diagnosis not present

## 2023-07-18 DIAGNOSIS — G894 Chronic pain syndrome: Secondary | ICD-10-CM | POA: Diagnosis not present

## 2023-07-18 DIAGNOSIS — I10 Essential (primary) hypertension: Secondary | ICD-10-CM | POA: Diagnosis not present

## 2023-07-23 DIAGNOSIS — R809 Proteinuria, unspecified: Secondary | ICD-10-CM | POA: Diagnosis not present

## 2023-07-23 DIAGNOSIS — E1122 Type 2 diabetes mellitus with diabetic chronic kidney disease: Secondary | ICD-10-CM | POA: Diagnosis not present

## 2023-07-23 DIAGNOSIS — N269 Renal sclerosis, unspecified: Secondary | ICD-10-CM | POA: Diagnosis not present

## 2023-07-23 DIAGNOSIS — E1129 Type 2 diabetes mellitus with other diabetic kidney complication: Secondary | ICD-10-CM | POA: Diagnosis not present

## 2023-07-23 DIAGNOSIS — N189 Chronic kidney disease, unspecified: Secondary | ICD-10-CM | POA: Diagnosis not present

## 2023-07-31 DIAGNOSIS — I129 Hypertensive chronic kidney disease with stage 1 through stage 4 chronic kidney disease, or unspecified chronic kidney disease: Secondary | ICD-10-CM | POA: Diagnosis not present

## 2023-07-31 DIAGNOSIS — E1129 Type 2 diabetes mellitus with other diabetic kidney complication: Secondary | ICD-10-CM | POA: Diagnosis not present

## 2023-07-31 DIAGNOSIS — R809 Proteinuria, unspecified: Secondary | ICD-10-CM | POA: Diagnosis not present

## 2023-07-31 DIAGNOSIS — N269 Renal sclerosis, unspecified: Secondary | ICD-10-CM | POA: Diagnosis not present

## 2023-08-14 DIAGNOSIS — M25551 Pain in right hip: Secondary | ICD-10-CM | POA: Diagnosis not present

## 2023-08-14 DIAGNOSIS — M25562 Pain in left knee: Secondary | ICD-10-CM | POA: Diagnosis not present

## 2023-08-14 DIAGNOSIS — Z96641 Presence of right artificial hip joint: Secondary | ICD-10-CM | POA: Diagnosis not present

## 2023-08-14 DIAGNOSIS — Z96652 Presence of left artificial knee joint: Secondary | ICD-10-CM | POA: Diagnosis not present

## 2023-08-21 DIAGNOSIS — Z23 Encounter for immunization: Secondary | ICD-10-CM | POA: Diagnosis not present

## 2023-09-12 DIAGNOSIS — E1165 Type 2 diabetes mellitus with hyperglycemia: Secondary | ICD-10-CM | POA: Diagnosis not present

## 2023-09-12 DIAGNOSIS — Z794 Long term (current) use of insulin: Secondary | ICD-10-CM | POA: Diagnosis not present

## 2023-09-26 DIAGNOSIS — I129 Hypertensive chronic kidney disease with stage 1 through stage 4 chronic kidney disease, or unspecified chronic kidney disease: Secondary | ICD-10-CM | POA: Diagnosis not present

## 2023-09-26 DIAGNOSIS — E1129 Type 2 diabetes mellitus with other diabetic kidney complication: Secondary | ICD-10-CM | POA: Diagnosis not present

## 2023-09-26 DIAGNOSIS — E782 Mixed hyperlipidemia: Secondary | ICD-10-CM | POA: Diagnosis not present

## 2023-10-17 DIAGNOSIS — G894 Chronic pain syndrome: Secondary | ICD-10-CM | POA: Diagnosis not present

## 2023-10-17 DIAGNOSIS — E1122 Type 2 diabetes mellitus with diabetic chronic kidney disease: Secondary | ICD-10-CM | POA: Diagnosis not present

## 2023-10-17 DIAGNOSIS — E1129 Type 2 diabetes mellitus with other diabetic kidney complication: Secondary | ICD-10-CM | POA: Diagnosis not present

## 2023-10-30 DIAGNOSIS — E1122 Type 2 diabetes mellitus with diabetic chronic kidney disease: Secondary | ICD-10-CM | POA: Diagnosis not present

## 2023-10-30 DIAGNOSIS — N269 Renal sclerosis, unspecified: Secondary | ICD-10-CM | POA: Diagnosis not present

## 2023-10-30 DIAGNOSIS — N189 Chronic kidney disease, unspecified: Secondary | ICD-10-CM | POA: Diagnosis not present

## 2023-10-30 DIAGNOSIS — C92Z1 Other myeloid leukemia, in remission: Secondary | ICD-10-CM | POA: Diagnosis not present

## 2023-10-30 DIAGNOSIS — I129 Hypertensive chronic kidney disease with stage 1 through stage 4 chronic kidney disease, or unspecified chronic kidney disease: Secondary | ICD-10-CM | POA: Diagnosis not present

## 2023-11-06 DIAGNOSIS — N269 Renal sclerosis, unspecified: Secondary | ICD-10-CM | POA: Diagnosis not present

## 2023-11-06 DIAGNOSIS — R801 Persistent proteinuria, unspecified: Secondary | ICD-10-CM | POA: Diagnosis not present

## 2023-11-06 DIAGNOSIS — E1122 Type 2 diabetes mellitus with diabetic chronic kidney disease: Secondary | ICD-10-CM | POA: Diagnosis not present

## 2023-11-06 DIAGNOSIS — I129 Hypertensive chronic kidney disease with stage 1 through stage 4 chronic kidney disease, or unspecified chronic kidney disease: Secondary | ICD-10-CM | POA: Diagnosis not present

## 2024-01-02 DIAGNOSIS — C9211 Chronic myeloid leukemia, BCR/ABL-positive, in remission: Secondary | ICD-10-CM | POA: Diagnosis not present

## 2024-01-20 DIAGNOSIS — E7849 Other hyperlipidemia: Secondary | ICD-10-CM | POA: Diagnosis not present

## 2024-01-20 DIAGNOSIS — E1140 Type 2 diabetes mellitus with diabetic neuropathy, unspecified: Secondary | ICD-10-CM | POA: Diagnosis not present

## 2024-01-20 DIAGNOSIS — N1832 Chronic kidney disease, stage 3b: Secondary | ICD-10-CM | POA: Diagnosis not present

## 2024-01-20 DIAGNOSIS — E1129 Type 2 diabetes mellitus with other diabetic kidney complication: Secondary | ICD-10-CM | POA: Diagnosis not present

## 2024-01-20 DIAGNOSIS — I10 Essential (primary) hypertension: Secondary | ICD-10-CM | POA: Diagnosis not present

## 2024-01-20 DIAGNOSIS — E782 Mixed hyperlipidemia: Secondary | ICD-10-CM | POA: Diagnosis not present

## 2024-02-12 DIAGNOSIS — M1611 Unilateral primary osteoarthritis, right hip: Secondary | ICD-10-CM | POA: Diagnosis not present

## 2024-02-12 DIAGNOSIS — M1712 Unilateral primary osteoarthritis, left knee: Secondary | ICD-10-CM | POA: Diagnosis not present

## 2024-02-12 DIAGNOSIS — E1122 Type 2 diabetes mellitus with diabetic chronic kidney disease: Secondary | ICD-10-CM | POA: Diagnosis not present

## 2024-02-12 DIAGNOSIS — Z471 Aftercare following joint replacement surgery: Secondary | ICD-10-CM | POA: Diagnosis not present

## 2024-02-12 DIAGNOSIS — Z96652 Presence of left artificial knee joint: Secondary | ICD-10-CM | POA: Diagnosis not present

## 2024-02-12 DIAGNOSIS — Z96641 Presence of right artificial hip joint: Secondary | ICD-10-CM | POA: Diagnosis not present

## 2024-02-12 DIAGNOSIS — M1711 Unilateral primary osteoarthritis, right knee: Secondary | ICD-10-CM | POA: Diagnosis not present

## 2024-02-24 ENCOUNTER — Ambulatory Visit: Payer: Medicare Other | Admitting: Urology

## 2024-03-03 DIAGNOSIS — R809 Proteinuria, unspecified: Secondary | ICD-10-CM | POA: Diagnosis not present

## 2024-03-03 DIAGNOSIS — N189 Chronic kidney disease, unspecified: Secondary | ICD-10-CM | POA: Diagnosis not present

## 2024-03-03 DIAGNOSIS — D631 Anemia in chronic kidney disease: Secondary | ICD-10-CM | POA: Diagnosis not present

## 2024-03-03 DIAGNOSIS — E211 Secondary hyperparathyroidism, not elsewhere classified: Secondary | ICD-10-CM | POA: Diagnosis not present

## 2024-03-09 DIAGNOSIS — N1832 Chronic kidney disease, stage 3b: Secondary | ICD-10-CM | POA: Diagnosis not present

## 2024-03-09 DIAGNOSIS — R809 Proteinuria, unspecified: Secondary | ICD-10-CM | POA: Diagnosis not present

## 2024-03-09 DIAGNOSIS — N269 Renal sclerosis, unspecified: Secondary | ICD-10-CM | POA: Diagnosis not present

## 2024-03-09 DIAGNOSIS — E1129 Type 2 diabetes mellitus with other diabetic kidney complication: Secondary | ICD-10-CM | POA: Diagnosis not present

## 2024-03-19 DIAGNOSIS — Z794 Long term (current) use of insulin: Secondary | ICD-10-CM | POA: Diagnosis not present

## 2024-03-19 DIAGNOSIS — E1165 Type 2 diabetes mellitus with hyperglycemia: Secondary | ICD-10-CM | POA: Diagnosis not present

## 2024-04-13 ENCOUNTER — Ambulatory Visit: Admitting: Urology

## 2024-04-13 ENCOUNTER — Encounter: Payer: Self-pay | Admitting: Urology

## 2024-04-13 VITALS — BP 124/80 | HR 83

## 2024-04-13 DIAGNOSIS — R972 Elevated prostate specific antigen [PSA]: Secondary | ICD-10-CM

## 2024-04-13 LAB — URINALYSIS, ROUTINE W REFLEX MICROSCOPIC
Bilirubin, UA: NEGATIVE
Ketones, UA: NEGATIVE
Leukocytes,UA: NEGATIVE
Nitrite, UA: NEGATIVE
RBC, UA: NEGATIVE
Specific Gravity, UA: 1.015 (ref 1.005–1.030)
Urobilinogen, Ur: 0.2 mg/dL (ref 0.2–1.0)
pH, UA: 5.5 (ref 5.0–7.5)

## 2024-04-13 NOTE — Progress Notes (Unsigned)
 04/13/2024 10:56 AM   Dylan Spears. 07/14/1956 [id number]  Referring provider: Minus Amel, MD [address] [place],  [place] [address]  No chief complaint on file.   HPI:  New pt -   1) PSA elevation - Feb 2025 PSA 4.3. No prior PSA elevation. No biopsy. No FH Pca. No h/o BPH. IPSS 3. Hct 49. Cr 1.77, gfr 41. No hydro prior US .   Today, seen for the above.   He retired from [employer].     PMH: Past Medical History:  Diagnosis Date   Chronic back pain    CML (chronic myelocytic leukemia) (HCC)    Diabetes mellitus without complication (HCC)    History of stress test    myoview  study suggested the possibility of basal inferior wall ischemia which was present in all 3 tonographic views.   Hx of echocardiogram 05/2012   EF 55%Mildly hypertrophic left ventricle with normal systolic function and early signs of mild diastolic dysfuntion. Moderately dilated left atrium, mild aortic valve sclerosis with out stenosis.   Hyperlipemia    Hypertension    Leukemia [facility])     Surgical History: Past Surgical History:  Procedure Laterality Date   BACK SURGERY  12/2006   CARDIAC CATHETERIZATION  2010   revealed mild nonobstructive CAD   CARDIAC CATHETERIZATION  07/2012   This showed essentially normal coronary arteries without obstruction. there was normal LV function.   COLONOSCOPY WITH PROPOFOL  N/A 07/14/2018   Procedure: COLONOSCOPY WITH PROPOFOL ;  Surgeon: Suzette Espy, MD;  [facility];  Service: Endoscopy;  Laterality: N/A;  10:45am   COLONOSCOPY WITH PROPOFOL  N/A 02/13/2021   Procedure: COLONOSCOPY WITH PROPOFOL ;  Surgeon: Suzette Espy, MD;  [facility];  Service: Endoscopy;  Laterality: N/A;  PM appt   KNEE SURGERY Left 01/1994   LEFT HEART CATHETERIZATION WITH CORONARY ANGIOGRAM N/A 07/29/2012   Procedure: LEFT HEART CATHETERIZATION WITH CORONARY ANGIOGRAM;  Surgeon: Millicent Ally, MD;  Location: [facility] CATH LAB;  Service:  Cardiovascular;  Laterality: N/A;   POLYPECTOMY  07/14/2018   Procedure: POLYPECTOMY;  Surgeon: Suzette Espy, MD;  [facility];  Service: Endoscopy;;  colon   POLYPECTOMY  02/13/2021   Procedure: POLYPECTOMY;  Surgeon: Suzette Espy, MD;  [facility];  Service: Endoscopy;;   TOTAL HIP ARTHROPLASTY Right     Home Medications:  Allergies as of 04/13/2024   No Known Allergies      Medication List        Accurate as of Apr 13, 2024 10:56 AM. If you have any questions, ask your nurse or doctor.          Accu-Chek Aviva Plus test strip Generic drug: glucose blood USE TO TEST 4 TIMES DAILY.   Accu-Chek Softclix Lancets lancets USE TO TEST 4 TIMES DAILY.   amLODipine  10 MG tablet Commonly known as: NORVASC  Take 10 mg by mouth daily.   aspirin  EC 81 MG tablet Take 81 mg by mouth daily.   Bystolic  10 MG tablet Generic drug: nebivolol  TAKE ONE TABLET BY MOUTH EVERY DAY. What changed: how much to take   empagliflozin 10 MG Tabs tablet Commonly known as: JARDIANCE Take 10 mg by mouth every other day. Take one tablet by mouth every other day.   ferrous sulfate 325 (65 FE) MG tablet Take 325 mg by mouth 2 (two) times daily.   multivitamin with minerals Tabs tablet Take 1 tablet by mouth daily.  olmesartan 40 MG tablet Commonly known as: BENICAR Take 40 mg by mouth daily.   Oxycodone HCl 10 MG Tabs Take 10 mg by mouth daily as needed (for pain.).   Ozempic (0.25 or 0.5 MG/DOSE) 2 MG/1.5ML Sopn Generic drug: Semaglutide(0.25 or 0.5MG /DOS) Inject 0.25 mg into the skin once a week.   rosuvastatin  40 MG tablet Commonly known as: CRESTOR  TAKE ONE TABLET BY MOUTH ONCE DAILY.   Sure Comfort Pen Needles 31G X 8 MM Misc Generic drug: Insulin  Pen Needle USING WITH INSULIN  INJECTIONS 4X DAILY.   Tresiba  FlexTouch 200 UNIT/ML FlexTouch Pen Generic drug: insulin  degludec INJECT 120 UNITS INTO THE SKIN AT BEDTIME. What changed: how much to  take        Allergies: No Known Allergies  Family History: Family History  Problem Relation Age of Onset   Colon cancer Mother 91   Cancer Father        Unsure of what type; not colon CA    Social History:  reports that he quit smoking about 36 years ago. His smoking use included cigarettes. He started smoking about 51 years ago. He has a 3.8 pack-year smoking history. He has never used smokeless tobacco. He reports current alcohol use. He reports current drug use. Drug: Marijuana.   Physical Exam: There were no vitals taken for this visit.  Constitutional:  Alert and oriented, No acute distress. HEENT: [place] AT, moist mucus membranes.  Trachea midline, no masses. Cardiovascular: No clubbing, cyanosis, or edema. Respiratory: Normal respiratory effort, no increased work of breathing. GI: Abdomen is soft, nontender, nondistended, no abdominal masses GU: No CVA tenderness Skin: No rashes, bruises or suspicious lesions. Neurologic: Grossly intact, no focal deficits, moving all 4 extremities. Psychiatric: Normal mood and affect.  Laboratory Data: Lab Results  Component Value Date   WBC 9.6 02/09/2021   HGB 16.2 02/09/2021   HCT 50.3 02/09/2021   MCV 84.7 02/09/2021   PLT 283 02/09/2021    Lab Results  Component Value Date   CREATININE 1.74 (H) 02/09/2021    No results found for: "PSA"  No results found for: "TESTOSTERONE "  Lab Results  Component Value Date   HGBA1C 9.2 (A) 03/24/2020    Urinalysis    Component Value Date/Time   COLORURINE YELLOW 01/20/2008 0809   APPEARANCEUR CLEAR 01/20/2008 0809   LABSPEC 1.022 01/20/2008 0809   PHURINE 6.0 01/20/2008 0809   GLUCOSEU NEGATIVE 01/20/2008 0809   HGBUR NEGATIVE 01/20/2008 0809   BILIRUBINUR NEGATIVE 01/20/2008 0809   KETONESUR NEGATIVE 01/20/2008 0809   PROTEINUR NEGATIVE 01/20/2008 0809   UROBILINOGEN 0.2 01/20/2008 0809   NITRITE NEGATIVE 01/20/2008 0809   LEUKOCYTESUR  01/20/2008 0809    NEGATIVE  MICROSCOPIC NOT DONE ON URINES WITH NEGATIVE PROTEIN, BLOOD, LEUKOCYTES, NITRITE, OR GLUCOSE <1000 mg/dL.    No results found for: "LABMICR", "WBCUA", "RBCUA", "LABEPIT", "MUCUS", "BACTERIA"  Pertinent Imaging:   Results for orders placed during the hospital encounter of 03/18/15  US  Renal  Narrative CLINICAL DATA:  Chronic renal disease.  EXAM: RENAL/URINARY TRACT ULTRASOUND COMPLETE  COMPARISON:  None.  FINDINGS: Right Kidney:  Length: 13.0 cm. Echogenicity within normal limits. No mass or hydronephrosis visualized.  Left Kidney:  Length: 14.3 cm. Echogenicity within normal limits. No mass or hydronephrosis visualized.  Bladder:  Appears normal for degree of bladder distention.  IMPRESSION: Normal exam.   Electronically Signed By: Andy Bannister  Register On: 03/18/2015 11:03   Reviewed 28 pages referral notes.   Assessment & Plan:  PSA elevation - I had a long discussion with the patient on the nature of elevated PSA - benign vs malignant causes. We discussed age specific levels and PSA risk stratification. We discussed the nature risks and benefits of continued surveillance, other lab tests, imaging as well as prostate biopsy. We discussed the management of prostate cancer might include active surveillance or treatment depending on biopsy findings. All questions answered. PSA was sent and will proceed with MRI if remains elevated.    No follow-ups on file.  Christina Coyer, MD  [facility]  [address] [place], [place] [address] [phone]

## 2024-04-14 ENCOUNTER — Ambulatory Visit: Payer: Self-pay

## 2024-04-14 LAB — PSA: Prostate Specific Ag, Serum: 4 ng/mL (ref 0.0–4.0)

## 2024-04-17 DIAGNOSIS — G894 Chronic pain syndrome: Secondary | ICD-10-CM | POA: Diagnosis not present

## 2024-04-17 DIAGNOSIS — Z0001 Encounter for general adult medical examination with abnormal findings: Secondary | ICD-10-CM | POA: Diagnosis not present

## 2024-05-05 DIAGNOSIS — E1165 Type 2 diabetes mellitus with hyperglycemia: Secondary | ICD-10-CM | POA: Diagnosis not present

## 2024-05-05 DIAGNOSIS — Z794 Long term (current) use of insulin: Secondary | ICD-10-CM | POA: Diagnosis not present

## 2024-05-13 ENCOUNTER — Encounter: Payer: Self-pay | Admitting: Cardiovascular Disease

## 2024-05-13 ENCOUNTER — Ambulatory Visit: Attending: Cardiology | Admitting: Cardiovascular Disease

## 2024-05-13 VITALS — BP 116/74 | HR 80 | Ht 72.0 in | Wt 237.0 lb

## 2024-05-13 DIAGNOSIS — N183 Chronic kidney disease, stage 3 unspecified: Secondary | ICD-10-CM | POA: Diagnosis not present

## 2024-05-13 DIAGNOSIS — C9210 Chronic myeloid leukemia, BCR/ABL-positive, not having achieved remission: Secondary | ICD-10-CM

## 2024-05-13 DIAGNOSIS — I10 Essential (primary) hypertension: Secondary | ICD-10-CM

## 2024-05-13 DIAGNOSIS — E119 Type 2 diabetes mellitus without complications: Secondary | ICD-10-CM

## 2024-05-13 DIAGNOSIS — E785 Hyperlipidemia, unspecified: Secondary | ICD-10-CM

## 2024-05-13 NOTE — Patient Instructions (Signed)
 Medication Instructions:  NONE *If you need a refill on your cardiac medications before your next appointment, please call your pharmacy*  Lab Work: NONE If you have labs (blood work) drawn today and your tests are completely normal, you will receive your results only by: MyChart Message (if you have MyChart) OR A paper copy in the mail If you have any lab test that is abnormal or we need to change your treatment, we will call you to review the results.  Testing/Procedures: NONE  Follow-Up: At [facility], you and your health needs are our priority.  As part of our continuing mission to provide you with exceptional heart care, our providers are all part of one team.  This team includes your primary Cardiologist (physician) and Advanced Practice Providers or APPs (Physician Assistants and Nurse Practitioners) who all work together to provide you with the care you need, when you need it.  Your next appointment:   1 year(s)  Provider:   Vishnu Mallipeddi, MD   We recommend signing up for the patient portal called MyChart.  Sign up information is provided on this After Visit Summary.  MyChart is used to connect with patients for Virtual Visits (Telemedicine).  Patients are able to view lab/test results, encounter notes, upcoming appointments, etc.  Non-urgent messages can be sent to your provider as well.   To learn more about what you can do with MyChart, go to [email or web address].

## 2024-05-13 NOTE — Progress Notes (Signed)
 Patient ID: Dylan Sine., male   DOB: 07-13-1956, 68 y.o.   MRN: [id number]      PCP:  Dylan Spears   HPI: Dylan Innis., is a 68 y.o. male who presents to the office today for a 75 month cardiology evaluation.   Dylan Spears has a history of CML which was originally diagnosed in 2004, as well as a history of hypertension, hyperlipidemia, and type 2 diabetes mellitus. In 2010 he developed nitrate responsive chest pain which led to cardiac catheterization.  Catheterization revealed normal coronary arteries and normal LV function. In 2013 because of recurrent chest pain symptoms and echo Doppler study as well as Myoview  scan were done. With exercise Myoview  he exercised to a 9 minute workload. There was a question of borderline mild ischemia in the basal inferior wall. Ejection fraction is 54%. An echo Doppler study showed normal systolic and diastolic function. There was a question of pseudo-dyskinesis of the inferior wall due to increased infradiaphragmatic pressure.   He had mild aortic sclerosis.  When saw him in November 2017  he had noted some mild shortness of breath with significant activity.   Dylan Spears reduce his amlodipine  dose and he now is on amlodipine  at 5 mg, Bystolic  5 mg and ramipril  5 mg daily for hypertension, and atorvastatin 20 mg for hyperlipidemia.  He is diabetic on metformin  and chest CT but flex, touch insulin .  His CML is in remission and he continues to be on Gleevec.    I saw him in April 2019.  He admitted to mild weight gain and had not been exercising regularly.   He had laboratory in October 2018 by Dylan Spears which showed a cholesterol 123, HDL 43, LDL 64, triglycerides 79.  Recent hemoglobin A1c in March 2019 was 7.1. Renal function was mildly impaired at 1.49, but this was stable.  With his 15 pound weight gain, I discussed the American Heart Association recommendations of exercising at least 5 days/week for minimum of 30 minutes of moderate  intensity.  When I saw him in July 2020 he  remained stable from a cardiovascular perspective.  He specifically denied chest pain or shortness of breath.  His exercise has been limited due to bilateral knee discomfort and a bad back.  He has chronic low back pain, ruptured disc, and spinal stenosis.  He sees Dylan Spears for his kidney disease and apparently underwent an ultrasound-guided renal core biopsy on May 18, 2019.  He was told that the results were normal..  I saw him in January 2021.  Over the previous 6 months he had issues with diabetes  controlled.  His hemoglobin A1c had increased to 11 but this significantly improved 8.5 most recently in December 2020, followed by Dr. Monte Spears.  He denied chest pain or shortness of breath.  He was no longer on atorvastatin and had been started  on rosuvastatin  20 mg for an LDL of 155.    He was seen in August 2021 by Dylan Spears, Dylan Spears for preoperative clearance prior to undergoing  right hip [facility] at [facility].  Prior to surgery, his diabetes mellitus was under control he tells me his hemoglobin A1c had decreased to less than seventy before surgery was able to be done.  He tolerated surgery well.  His CML has remained in remission.  He was recently evaluated by GI for mild iron deficiency.  He is scheduled to undergo follow-up colonoscopy in March.  I saw him on January 05, 2021 at which  time he felt well and denied any chest pain or shortness of breath.  He continued to be on amlodipine  10 mg, Bystolic  10 mg and olmesartan 40 mg for hypertension.  He is now on rosuvastatin  40 mg for hyperlipidemia.  For his diabetes he is on Jardiance 10 mg, Humalog  insulin , and Ozempic.  He had lost weight and BMI was 32.7.  He was found to have iron deficiency and since had several polyps with plan to undergo a follow-up colonoscopy.  I last saw him on December 18, 2021.  He was in remission for his CML and has been off medications for approximately 2 years.  He felt well.  He  has CKD and is followed by Dr. Carrolyn Spears of nephrology in [place] with creatinine 1.85.  He is planning to undergo left knee replacement on Apr 02, 2022 [facility].  He denies chest pain PND orthopnea, presyncope or syncope or palpitations.    Since I last saw him, he underwent successful knee replacement surgery without cardiovascular compromise.  He was evaluated by Dylan Spears, Dylan Spears on 12/30/2018 for.  Creatinine was 1.78 in January 2024.  Presently, he continues to feel well.  He is on amlodipine  10 mg, Bystolic  10 mg, olmesartan 40 mg, and Dylan Spears Dia 10 mg every other day for his hypertension and renal preservation.  He is on rosuvastatin  40 mg daily for lipid management.  He is diabetic on Jardiance 10 mg every other day in addition to Tresiba  FlexTouch insulin .  He takes aspirin  81 mg daily.  He presents for evaluation.  Past Medical History:  Diagnosis Date   Chronic back pain    CML (chronic myelocytic leukemia) (HCC)    Diabetes mellitus without complication (HCC)    History of stress test    myoview  study suggested the possibility of basal inferior wall ischemia which was present in all 3 tonographic views.   Hx of echocardiogram 05/2012   EF 55%Mildly hypertrophic left ventricle with normal systolic function and early signs of mild diastolic dysfuntion. Moderately dilated left atrium, mild aortic valve sclerosis with out stenosis.   Hyperlipemia    Hypertension    Leukemia [facility])     Past Surgical History:  Procedure Laterality Date   BACK SURGERY  12/2006   CARDIAC CATHETERIZATION  2010   revealed mild nonobstructive CAD   CARDIAC CATHETERIZATION  07/2012   This showed essentially normal coronary arteries without obstruction. there was normal LV function.   COLONOSCOPY WITH PROPOFOL  N/A 07/14/2018   Procedure: COLONOSCOPY WITH PROPOFOL ;  Surgeon: Dylan Espy, MD;  [facility];  Service: Endoscopy;  Laterality: N/A;  10:45am   COLONOSCOPY WITH PROPOFOL  N/A 02/13/2021    Procedure: COLONOSCOPY WITH PROPOFOL ;  Surgeon: Dylan Espy, MD;  Location: [facility];  Service: Endoscopy;  Laterality: N/A;  PM appt   KNEE SURGERY Left 01/1994   LEFT HEART CATHETERIZATION WITH CORONARY ANGIOGRAM N/A 07/29/2012   Procedure: LEFT HEART CATHETERIZATION WITH CORONARY ANGIOGRAM;  Surgeon: Millicent Ally, MD;  Location: [facility] CATH LAB;  Service: Cardiovascular;  Laterality: N/A;   POLYPECTOMY  07/14/2018   Procedure: POLYPECTOMY;  Surgeon: Dylan Espy, MD;  Location: [facility];  Service: Endoscopy;;  colon   POLYPECTOMY  02/13/2021   Procedure: POLYPECTOMY;  Surgeon: Dylan Espy, MD;  [facility]: [facility];  Service: Endoscopy;;   TOTAL HIP ARTHROPLASTY Right     No Known Allergies  Current Outpatient Medications  Medication Sig Dispense Refill  ACCU-CHEK AVIVA PLUS test strip USE TO TEST 4 TIMES DAILY. 200 strip 3   Accu-Chek Softclix Lancets lancets USE TO TEST 4 TIMES DAILY. 200 each 3   amLODipine  (NORVASC ) 10 MG tablet Take 10 mg by mouth daily.     aspirin  EC 81 MG tablet Take 81 mg by mouth daily.     BYSTOLIC  10 MG tablet TAKE ONE TABLET BY MOUTH EVERY DAY. 30 tablet 4   empagliflozin (JARDIANCE) 10 MG TABS tablet Take 10 mg by mouth every other day. Take one tablet by mouth every other day.     ferrous sulfate 325 (65 FE) MG tablet Take 325 mg by mouth 2 (two) times daily.     Insulin  Pen Needle (SURE COMFORT PEN NEEDLES) 31G X 8 MM MISC USING WITH INSULIN  INJECTIONS 4X DAILY. 100 each 3   KERENDIA 10 MG TABS Take 1 tablet by mouth daily.     Multiple Vitamin (MULTIVITAMIN WITH MINERALS) TABS Take 1 tablet by mouth daily.     olmesartan (BENICAR) 40 MG tablet Take 40 mg by mouth daily.     Oxycodone HCl 10 MG TABS Take 10 mg by mouth daily as needed (for pain.).      OZEMPIC, 0.25 OR 0.5 MG/DOSE, 2 MG/1.5ML SOPN Inject 0.25 mg into the skin once a week.     rosuvastatin  (CRESTOR ) 40 MG tablet TAKE ONE TABLET BY MOUTH ONCE DAILY. 90 tablet 0    TRESIBA  FLEXTOUCH 200 UNIT/ML FlexTouch Pen INJECT 120 UNITS INTO THE SKIN AT BEDTIME. (Patient taking differently: Inject 70 Units into the skin at bedtime. PT. REPORTS 55 UNITS) 54 mL 0   No current facility-administered medications for this visit.    Social History   Socioeconomic History   Marital status: Married    Spouse name: Not on file   Number of children: Not on file   Years of education: Not on file   Highest education level: Not on file  Occupational History   Not on file  Tobacco Use   Smoking status: Former    Current packs/day: 0.00    Average packs/day: 0.3 packs/day for 15.0 years (3.8 ttl pk-yrs)    Types: Cigarettes    Start date: 07/07/1972    Quit date: 07/08/1987    Years since quitting: 36.8   Smokeless tobacco: Never   Tobacco comments:    30 years ago  Vaping Use   Vaping status: Never Used  Substance and Sexual Activity   Alcohol use: Yes    Comment: very seldom   Drug use: Yes    Types: Marijuana   Sexual activity: Yes    Birth control/protection: None  Other Topics Concern   Not on file  Social History Narrative   Not on file   Social Drivers of Health   Financial Resource Strain: Low Risk  (05/05/2024)   Received from [facility]   Overall Financial Resource Strain (CARDIA)    Difficulty of Paying Living Expenses: Not hard at all  Food Insecurity: No Food Insecurity (05/05/2024)   Received from [facility]   Hunger Vital Sign    Within the past 12 months, you worried that your food would run out before you got the money to buy more.: Never true    Within the past 12 months, the food you bought just didn't last and you didn't have money to get more.: Never true  Transportation Needs: No Transportation Needs (05/05/2024)   Received from [facility]   PRAPARE - Transportation    In the past 12 months, has lack of transportation kept you from medical appointments or from getting  medications?: No    Lack of Transportation (Non-Medical): No  Physical Activity: Not on file  Stress: Not on file  Social Connections: Unknown (08/09/2022)   [facility] from [facility]   Social Network    Social Network: Not on file  Intimate Partner Violence: Unknown (08/09/2022)   Received from [facility]   HITS    Physically Hurt: Not on file    Insult or Talk Down To: Not on file    Threaten Physical Harm: Not on file    Scream or Curse: Not on file    Family History  Problem Relation Age of Onset   Colon cancer Mother 42   Cancer Father        Unsure of what type; not colon CA   Social history is notable in that he is married. He has 2 children. There is no tobacco use. He quit at age 50. He does not routinely exercise. He does note some fatigue. He rarely drinks alcohol.  ROS General: Negative; No fevers, chills, or night sweats;  HEENT: Negative; No changes in vision or hearing, sinus congestion, difficulty swallowing Pulmonary: Negative; No cough, wheezing, shortness of breath, hemoptysis Cardiovascular: Negative; No chest pain, presyncope, syncope, palpitations GI: Negative; No nausea, vomiting, diarrhea, or abdominal pain GU: Mild erectile dysfunction Musculoskeletal: Bilateral knee discomfort and low back pain; recent right hip replacement at [facility]; s/p left knee replacement May 2023 Hematologic/Oncology: Positive for CML; stable in remission Endocrine: Positive for type 2 diabetes mellitus Neuro: Negative; no changes in balance, headaches Skin: Negative; No rashes or skin lesions Psychiatric: Negative; No behavioral problems, depression Sleep: Negative; No snoring, daytime sleepiness, hypersomnolence, bruxism, restless legs, hypnogognic hallucinations, no cataplexy Other comprehensive 14 point system review is negative.  PE BP 116/74   Pulse 80   Ht 6' (1.829 m)   Wt 245 lb (111.1 kg)   SpO2 97%   BMI 33.23 kg/m    Repeat blood pressure by me  110/70  Wt Readings from Last 3 Encounters:  05/13/24 245 lb (111.1 kg)  04/30/23 241 lb (109.3 kg)  12/18/21 248 lb 6.4 oz (112.7 kg)   General: Alert, oriented, no distress.  Skin: normal turgor, no rashes, warm and dry HEENT: Normocephalic, atraumatic. Pupils equal round and reactive to light; sclera anicteric; extraocular muscles intact;  Nose without nasal septal hypertrophy Mouth/Parynx benign; Mallinpatti scale 2 Neck: No JVD, no carotid bruits; normal carotid upstroke Lungs: clear to ausculatation and percussion; no wheezing or rales Chest wall: without tenderness to palpitation Heart: PMI not displaced, RRR, s1 s2 normal, 1/6 systolic murmur, no diastolic murmur, no rubs, gallops, thrills, or heaves Abdomen: soft, nontender; no hepatosplenomehaly, BS+; abdominal aorta nontender and not dilated by palpation. Back: no CVA tenderness Pulses 2+ Musculoskeletal: full range of motion, normal strength, no joint deformities Extremities: no clubbing cyanosis or edema, Homan's sign negative  Neurologic: grossly nonfocal; Cranial nerves grossly wnl Psychologic: Normal mood and affect  EKG Interpretation Date/Time:  Wednesday May 13 2024 10:59:43 EDT Ventricular Rate:  80 PR Interval:  176 QRS Duration:  78 QT Interval:  334 QTC Calculation: 385 R Axis:   -32  Text Interpretation:  Normal sinus rhythm Left axis deviation Minimal voltage criteria for LVH, may be normal variant ( R in aVL ) When compared with ECG of 29-Jul-2012 06:59,  T wave inversion no longer evident in Inferior leads Nonspecific T wave abnormality now evident in Lateral leads Confirmed by Dylan Spears ([id number]) on 05/13/2024 4:00:20 PM    December 18, 2021 ECG (independently read by me):  NSR at 91; LVH by voltage  January 05, 2021 ECG (independently read by me): NSR at 88; LVH; normal intervals  January 2021 ECG (independently read by me): Normal sinus rhythm at 72 bpm.  LVH by voltage criteria.  ST abnormality  in lead III, aVF, and V6.  July 2020 ECG (independently read by me): NSR at 62; LVH; T wave abnormality in III  April 2019 ECG (independently read by me): normal sinus rhythm at 68 bpm.  LVH by voltage.  November 2017 ECG (independently read by me): Normal sinus rhythm at 65 bpm.  LVH by voltage.  Normal intervals.  T-wave abnormality in lead 3.  November 2016 ECG (independently read by me): Normal sinus rhythm at 66.  LVH by voltage criteria.  October 2015 ECG (independently read by me): Sinus rhythm at 76 beats per minute.  LVH by voltage criteria in lead aVL  October 2014 ECG: Sinus rhythm. Moderate LVH by voltage criteria. Early repolarization in V2  LABS:     Latest Ref Rng & Units 02/09/2021    4:03 PM 03/18/2020    8:03 AM 12/17/2019    9:17 AM  BMP  Glucose 70 - 99 mg/dL 93  884  95   BUN 8 - 23 mg/dL 29  24  21    Creatinine 0.61 - 1.24 mg/dL 1.66  0.63  0.16   BUN/Creat Ratio 6 - 22 (calc)  17  14   Sodium 135 - 145 mmol/L 139  138  139   Potassium 3.5 - 5.1 mmol/L 5.0  4.6  4.2   Chloride 98 - 111 mmol/L 109  104  102   CO2 22 - 32 mmol/L 20  26  20    Calcium  8.9 - 10.3 mg/dL 9.9  9.6  9.9       Latest Ref Rng & Units 03/18/2020    8:03 AM 12/17/2019    9:17 AM 07/29/2019    2:12 PM  Hepatic Function  Total Protein 6.1 - 8.1 g/dL 6.8  6.7  7.0   Albumin 3.8 - 4.8 g/dL  4.0    AST 10 - 35 U/L 18  21  19    ALT 9 - 46 U/L 24  21  24    Alk Phosphatase 39 - 117 IU/L  84    Total Bilirubin 0.2 - 1.2 mg/dL 0.3  0.3  0.4       Latest Ref Rng & Units 02/09/2021    4:03 PM 05/18/2019    6:06 AM 07/07/2018   11:13 AM  CBC  WBC 4.0 - 10.5 K/uL 9.6  11.1  8.4   Hemoglobin 13.0 - 17.0 g/dL 01.0  93.2  35.5   Hematocrit 39.0 - 52.0 % 50.3  44.8  36.9   Platelets 150 - 400 K/uL 283  296  319    Lab Results  Component Value Date   MCV 84.7 02/09/2021   MCV 85.5 05/18/2019   MCV 88.3 07/07/2018            05/01/2009   Lab Results  Component Value Date   HGBA1C 9.2  (A) 03/24/2020  Lipid Panel     Component Value Date/Time   CHOL 136 03/18/2020 0803   CHOL 150 12/17/2019 0917   TRIG  111 03/18/2020 0803   HDL 39 (L) 03/18/2020 0803   HDL 41 12/17/2019 0917   CHOLHDL 3.5 03/18/2020 0803   LDLCALC 78 03/18/2020 0803     RADIOLOGY: No results found.  IMPRESSION: 1. Essential hypertension   2. Hyperlipidemia with target LDL less than 70      ASSESSMENT AND PLAN: Mr. Christon Parada is a 68 year old African-American gentleman gentleman who has a history of hypertension with mild left ventricular hypertrophy on echocardiography and also by ECG criteria.   He has a history of renal insufficiency which has been stable as well as CML, which has been in remission.  He underwent an ultrasound guided renal core biopsy to further evaluate his renal insufficiency and proteinuria ordered by by Dylan Spears and was told that this was okay.  Since Dylan Spears retired he is followed by Dr. Carrolyn Spears.  Creatinine was 1.85 on November 29, 2021 consistent with stage IIIb CKD.  Presently, his blood pressure stable on amlodipine  10 mg, Bystolic  10 mg, and olmesartan 40 mg daily.  He is on Kerendia 10 mg every other day for renal preservation.  He is diabetic on Jardiance and Tresiba  in addition to Ozempic.  He tells me recent lab work done by Dr. Carrolyn Spears.  He was told that his creatinine had improved.  Prior creatinine in March 2024 had elevated up to 1.93.  He underwent successful left knee replacement surgery without cardiovascular compromise.  He lives in the [place] area.  He is aware of my imminent retirement.  I will transition him to the care of Dr. Vishnu Mallipeddi in 1 year in our [place] office for follow-up cardiology care.    Millicent Ally, MD, [facility]  05/13/2024 11:23 AM

## 2024-07-02 DIAGNOSIS — C9211 Chronic myeloid leukemia, BCR/ABL-positive, in remission: Secondary | ICD-10-CM | POA: Diagnosis not present

## 2024-07-13 ENCOUNTER — Ambulatory Visit: Admitting: Urology

## 2024-07-13 VITALS — BP 127/65 | HR 84

## 2024-07-13 DIAGNOSIS — R972 Elevated prostate specific antigen [PSA]: Secondary | ICD-10-CM

## 2024-07-13 LAB — URINALYSIS, ROUTINE W REFLEX MICROSCOPIC
Bilirubin, UA: NEGATIVE
Ketones, UA: NEGATIVE
Leukocytes,UA: NEGATIVE
Nitrite, UA: NEGATIVE
RBC, UA: NEGATIVE
Specific Gravity, UA: 1.02 (ref 1.005–1.030)
Urobilinogen, Ur: 0.2 mg/dL (ref 0.2–1.0)
pH, UA: 6 (ref 5.0–7.5)

## 2024-07-13 NOTE — Progress Notes (Unsigned)
 07/13/2024 11:38 AM   Dylan Spears. 11-11-1956 [id number]  Referring provider: Marvine Rush, MD [address] [place],  [place] [address]  No chief complaint on file.   HPI:  F/u -    1) PSA elevation - Feb 2025 PSA 4.3. No prior PSA elevation. No biopsy. No FH Pca. No h/o BPH. IPSS 3. Hct 49. Cr 1.77, gfr 41. No hydro prior US .    Today, seen for the above. His 05/25 PSA 4.0 - slightly lower. Check PSA. IPSS 2.  UA clear.    He retired from [employer].   PMH: Past Medical History:  Diagnosis Date   Chronic back pain    CML (chronic myelocytic leukemia) (HCC)    Diabetes mellitus without complication (HCC)    History of stress test    myoview  study suggested the possibility of basal inferior wall ischemia which was present in all 3 tonographic views.   Hx of echocardiogram 05/2012   EF 55%Mildly hypertrophic left ventricle with normal systolic function and early signs of mild diastolic dysfuntion. Moderately dilated left atrium, mild aortic valve sclerosis with out stenosis.   Hyperlipemia    Hypertension    Leukemia [facility])     Surgical History: Past Surgical History:  Procedure Laterality Date   BACK SURGERY  12/2006   CARDIAC CATHETERIZATION  2010   revealed mild nonobstructive CAD   CARDIAC CATHETERIZATION  07/2012   This showed essentially normal coronary arteries without obstruction. there was normal LV function.   COLONOSCOPY WITH PROPOFOL  N/A 07/14/2018   Procedure: COLONOSCOPY WITH PROPOFOL ;  Surgeon: Shaaron Lamar HERO, MD;  [facility]: [facility];  Service: Endoscopy;  Laterality: N/A;  10:45am   COLONOSCOPY WITH PROPOFOL  N/A 02/13/2021   Procedure: COLONOSCOPY WITH PROPOFOL ;  Surgeon: Shaaron Lamar HERO, MD;  Location: [facility];  Service: Endoscopy;  Laterality: N/A;  PM appt   KNEE SURGERY Left 01/1994   LEFT HEART CATHETERIZATION WITH CORONARY ANGIOGRAM N/A 07/29/2012   Procedure: LEFT HEART CATHETERIZATION WITH CORONARY ANGIOGRAM;  Surgeon:  Debby DELENA Sor, MD;  Location: [facility] CATH LAB;  Service: Cardiovascular;  Laterality: N/A;   POLYPECTOMY  07/14/2018   Procedure: POLYPECTOMY;  Surgeon: Shaaron Lamar HERO, MD;  Location: [facility];  Service: Endoscopy;;  colon   POLYPECTOMY  02/13/2021   Procedure: POLYPECTOMY;  Surgeon: Shaaron Lamar HERO, MD;  [facility]: [facility];  Service: Endoscopy;;   TOTAL HIP ARTHROPLASTY Right     Home Medications:  Allergies as of 07/13/2024   No Known Allergies      Medication List        Accurate as of July 13, 2024 11:38 AM. If you have any questions, ask your nurse or doctor.          Accu-Chek Aviva Plus test strip Generic drug: glucose blood USE TO TEST 4 TIMES DAILY.   Accu-Chek Softclix Lancets lancets USE TO TEST 4 TIMES DAILY.   amLODipine  10 MG tablet Commonly known as: NORVASC  Take 10 mg by mouth daily.   aspirin  EC 81 MG tablet Take 81 mg by mouth daily.   Bystolic  10 MG tablet Generic drug: nebivolol  TAKE ONE TABLET BY MOUTH EVERY DAY.   empagliflozin 10 MG Tabs tablet Commonly known as: JARDIANCE Take 10 mg by mouth every other day. Take one tablet by mouth every other day.   ferrous sulfate 325 (65 FE) MG tablet Take 325 mg by mouth 2 (two) times daily.   Kerendia 10 MG Tabs  Generic drug: Finerenone Take 1 tablet by mouth daily.   multivitamin with minerals Tabs tablet Take 1 tablet by mouth daily.   olmesartan 40 MG tablet Commonly known as: BENICAR Take 40 mg by mouth daily.   Oxycodone HCl 10 MG Tabs Take 10 mg by mouth daily as needed (for pain.).   Ozempic (0.25 or 0.5 MG/DOSE) 2 MG/1.5ML Sopn Generic drug: Semaglutide(0.25 or 0.5MG /DOS) Inject 0.25 mg into the skin once a week.   rosuvastatin  40 MG tablet Commonly known as: CRESTOR  TAKE ONE TABLET BY MOUTH ONCE DAILY.   Sure Comfort Pen Needles 31G X 8 MM Misc Generic drug: Insulin  Pen Needle USING WITH INSULIN  INJECTIONS 4X DAILY.   Tresiba  FlexTouch 200 UNIT/ML FlexTouch  Pen Generic drug: insulin  degludec INJECT 120 UNITS INTO THE SKIN AT BEDTIME. What changed:  how much to take additional instructions        Allergies: No Known Allergies  Family History: Family History  Problem Relation Age of Onset   Colon cancer Mother 30   Cancer Father        Unsure of what type; not colon CA    Social History:  reports that he quit smoking about 37 years ago. His smoking use included cigarettes. He started smoking about 52 years ago. He has a 3.8 pack-year smoking history. He has never used smokeless tobacco. He reports current alcohol use. He reports current drug use. Drug: Marijuana.   Physical Exam: BP 127/65   Pulse 84   Constitutional:  Alert and oriented, No acute distress. HEENT: [place] AT, moist mucus membranes.  Trachea midline, no masses. Cardiovascular: No clubbing, cyanosis, or edema. Respiratory: Normal respiratory effort, no increased work of breathing. GI: Abdomen is soft, nontender, nondistended, no abdominal masses GU: No CVA tenderness Lymph: No cervical or inguinal lymphadenopathy. Skin: No rashes, bruises or suspicious lesions. Neurologic: Grossly intact, no focal deficits, moving all 4 extremities. Psychiatric: Normal mood and affect.  Laboratory Data: Lab Results  Component Value Date   WBC 9.6 02/09/2021   HGB 16.2 02/09/2021   HCT 50.3 02/09/2021   MCV 84.7 02/09/2021   PLT 283 02/09/2021    Lab Results  Component Value Date   CREATININE 1.74 (H) 02/09/2021    No results found for: PSA  No results found for: TESTOSTERONE   Lab Results  Component Value Date   HGBA1C 9.2 (A) 03/24/2020    Urinalysis    Component Value Date/Time   COLORURINE YELLOW 01/20/2008 0809   APPEARANCEUR Clear 04/13/2024 1115   LABSPEC 1.022 01/20/2008 0809   PHURINE 6.0 01/20/2008 0809   GLUCOSEU 3+ (A) 04/13/2024 1115   HGBUR NEGATIVE 01/20/2008 0809   BILIRUBINUR Negative 04/13/2024 1115   KETONESUR NEGATIVE 01/20/2008 0809    PROTEINUR Trace 04/13/2024 1115   PROTEINUR NEGATIVE 01/20/2008 0809   UROBILINOGEN 0.2 01/20/2008 0809   NITRITE Negative 04/13/2024 1115   NITRITE NEGATIVE 01/20/2008 0809   LEUKOCYTESUR Negative 04/13/2024 1115    Lab Results  Component Value Date   LABMICR Comment 04/13/2024    Pertinent Imaging:   Results for orders placed during the hospital encounter of 03/18/15  US  Renal  Narrative CLINICAL DATA:  Chronic renal disease.  EXAM: RENAL/URINARY TRACT ULTRASOUND COMPLETE  COMPARISON:  None.  FINDINGS: Right Kidney:  Length: 13.0 cm. Echogenicity within normal limits. No mass or hydronephrosis visualized.  Left Kidney:  Length: 14.3 cm. Echogenicity within normal limits. No mass or hydronephrosis visualized.  Bladder:  Appears normal for degree of bladder distention.  IMPRESSION: Normal exam.   Electronically Signed By: Debby  Register On: 03/18/2015 11:03  Assessment & Plan:    1. Elevated PSA (Primary)  Again discussed with the patient and his wife the etiology of PSA-benign versus malignant.  Also risk sratification with velocity, age-specific levels, etc.  And the nature risk benefits and alternatives to prostate MRI versus transrectal ultrasound and prostate biopsy.  Discussed management of prostate cancer might include active surveillance or continued monitoring.  PSA was sent.  He said he typically has to do the open MRI but would try the regular MRI with Valium  if needed.  If not feasible we would need to set up a biopsy.  - Urinalysis, Routine w reflex microscopic   No follow-ups on file.  Donnice Brooks, MD  [facility]  [address] [place], [place] [address] [phone]

## 2024-07-14 ENCOUNTER — Ambulatory Visit: Payer: Self-pay

## 2024-07-14 LAB — PSA: Prostate Specific Ag, Serum: 4.3 ng/mL — ABNORMAL HIGH (ref 0.0–4.0)

## 2024-07-20 DIAGNOSIS — E782 Mixed hyperlipidemia: Secondary | ICD-10-CM | POA: Diagnosis not present

## 2024-07-20 DIAGNOSIS — G894 Chronic pain syndrome: Secondary | ICD-10-CM | POA: Diagnosis not present

## 2024-07-20 DIAGNOSIS — E7849 Other hyperlipidemia: Secondary | ICD-10-CM | POA: Diagnosis not present

## 2024-08-05 DIAGNOSIS — Z794 Long term (current) use of insulin: Secondary | ICD-10-CM | POA: Diagnosis not present

## 2024-08-05 DIAGNOSIS — E785 Hyperlipidemia, unspecified: Secondary | ICD-10-CM | POA: Diagnosis not present

## 2024-08-05 DIAGNOSIS — N182 Chronic kidney disease, stage 2 (mild): Secondary | ICD-10-CM | POA: Diagnosis not present

## 2024-08-05 DIAGNOSIS — Z23 Encounter for immunization: Secondary | ICD-10-CM | POA: Diagnosis not present

## 2024-08-05 DIAGNOSIS — I129 Hypertensive chronic kidney disease with stage 1 through stage 4 chronic kidney disease, or unspecified chronic kidney disease: Secondary | ICD-10-CM | POA: Diagnosis not present

## 2024-08-05 DIAGNOSIS — R7309 Other abnormal glucose: Secondary | ICD-10-CM | POA: Diagnosis not present

## 2024-08-05 DIAGNOSIS — E1122 Type 2 diabetes mellitus with diabetic chronic kidney disease: Secondary | ICD-10-CM | POA: Diagnosis not present

## 2024-08-05 DIAGNOSIS — C9210 Chronic myeloid leukemia, BCR/ABL-positive, not having achieved remission: Secondary | ICD-10-CM | POA: Diagnosis not present

## 2024-08-05 DIAGNOSIS — E1165 Type 2 diabetes mellitus with hyperglycemia: Secondary | ICD-10-CM | POA: Diagnosis not present

## 2024-10-07 NOTE — Progress Notes (Signed)
 Dylan Spears.                                          MRN: [id number]   10/07/2024   The [facility] Quality Team Specialist reviewed this patient medical record for the purposes of chart review for care gap closure. The following were reviewed: chart review for care gap closure-glycemic status assessment.    [facility] Quality Team

## 2025-01-25 ENCOUNTER — Ambulatory Visit: Admitting: Urology
